# Patient Record
Sex: Female | Born: 1970
Health system: Southern US, Community
[De-identification: ages and names within clinical notes are randomized; demographics above are authoritative.]

## PROBLEM LIST (undated history)

## (undated) DIAGNOSIS — Z3046 Encounter for surveillance of implantable subdermal contraceptive: Secondary | ICD-10-CM

## (undated) DIAGNOSIS — B182 Chronic viral hepatitis C: Secondary | ICD-10-CM

## (undated) DIAGNOSIS — Z8669 Personal history of other diseases of the nervous system and sense organs: Secondary | ICD-10-CM

## (undated) DIAGNOSIS — Z309 Encounter for contraceptive management, unspecified: Secondary | ICD-10-CM

## (undated) DIAGNOSIS — K219 Gastro-esophageal reflux disease without esophagitis: Secondary | ICD-10-CM

## (undated) HISTORY — DX: Gastro-esophageal reflux disease without esophagitis: K21.9

## (undated) HISTORY — PX: KNEE ARTHROPLASTY: SHX992

## (undated) HISTORY — DX: Encounter for surveillance of implantable subdermal contraceptive: Z30.46

## (undated) HISTORY — PX: CHOLECYSTECTOMY: SHX55

## (undated) HISTORY — DX: Encounter for contraceptive management, unspecified: Z30.9

## (undated) HISTORY — PX: BILE DUCT EXPLORATION: SHX1225

---

## 2011-04-20 ENCOUNTER — Encounter: Payer: Self-pay | Admitting: Emergency Medicine

## 2011-04-20 ENCOUNTER — Emergency Department (HOSPITAL_COMMUNITY)
Admission: EM | Admit: 2011-04-20 | Discharge: 2011-04-20 | Disposition: A | Discharge status: Home / Self Care | Payer: No Typology Code available for payment source | Attending: Emergency Medicine | Admitting: Emergency Medicine

## 2011-04-20 DIAGNOSIS — M549 Dorsalgia, unspecified: Secondary | ICD-10-CM | POA: Insufficient documentation

## 2011-04-20 DIAGNOSIS — S139XXA Sprain of joints and ligaments of unspecified parts of neck, initial encounter: Secondary | ICD-10-CM | POA: Insufficient documentation

## 2011-04-20 DIAGNOSIS — S161XXA Strain of muscle, fascia and tendon at neck level, initial encounter: Secondary | ICD-10-CM

## 2011-04-20 DIAGNOSIS — Y92410 Unspecified street and highway as the place of occurrence of the external cause: Secondary | ICD-10-CM | POA: Insufficient documentation

## 2011-04-20 MED ORDER — KETOROLAC TROMETHAMINE 60 MG/2ML IM SOLN
60.0000 mg | Freq: Once | INTRAMUSCULAR | Status: AC
  Administered 2011-04-20: 60 mg via INTRAMUSCULAR
  Filled 2011-04-20: qty 2

## 2011-04-20 MED ORDER — CYCLOBENZAPRINE HCL 10 MG PO TABS: 10.0000 mg | ORAL_TABLET | Freq: Two times a day (BID) | ORAL | Status: AC | PRN

## 2011-04-20 MED ORDER — IBUPROFEN 800 MG PO TABS: 800.0000 mg | ORAL_TABLET | Freq: Three times a day (TID) | ORAL | Status: AC
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
## 2011-06-06 DIAGNOSIS — B182 Chronic viral hepatitis C: Secondary | ICD-10-CM | POA: Insufficient documentation

## 2011-09-12 ENCOUNTER — Other Ambulatory Visit (HOSPITAL_COMMUNITY)
Admission: RE | Admit: 2011-09-12 | Discharge: 2011-09-12 | Disposition: A | Discharge status: Home / Self Care | Payer: Medicaid Other | Source: Ambulatory Visit | Attending: Obstetrics and Gynecology | Admitting: Obstetrics and Gynecology

## 2011-09-12 DIAGNOSIS — Z01419 Encounter for gynecological examination (general) (routine) without abnormal findings: Secondary | ICD-10-CM | POA: Insufficient documentation

## 2011-12-08 ENCOUNTER — Emergency Department (HOSPITAL_COMMUNITY)
Admission: EM | Admit: 2011-12-08 | Discharge: 2011-12-08 | Disposition: A | Discharge status: Home / Self Care | Payer: Medicaid Other | Attending: Emergency Medicine | Admitting: Emergency Medicine

## 2011-12-08 ENCOUNTER — Encounter (HOSPITAL_COMMUNITY): Payer: Self-pay | Admitting: *Deleted

## 2011-12-08 DIAGNOSIS — T310 Burns involving less than 10% of body surface: Secondary | ICD-10-CM | POA: Insufficient documentation

## 2011-12-08 DIAGNOSIS — B1920 Unspecified viral hepatitis C without hepatic coma: Secondary | ICD-10-CM | POA: Insufficient documentation

## 2011-12-08 DIAGNOSIS — T23209A Burn of second degree of unspecified hand, unspecified site, initial encounter: Secondary | ICD-10-CM | POA: Insufficient documentation

## 2011-12-08 DIAGNOSIS — X19XXXA Contact with other heat and hot substances, initial encounter: Secondary | ICD-10-CM | POA: Insufficient documentation

## 2011-12-08 DIAGNOSIS — T23201A Burn of second degree of right hand, unspecified site, initial encounter: Secondary | ICD-10-CM

## 2011-12-08 HISTORY — DX: Chronic viral hepatitis C: B18.2

## 2011-12-08 MED ORDER — SILVER SULFADIAZINE 1 % EX CREA: TOPICAL_CREAM | Freq: Once | CUTANEOUS | Status: DC

## 2011-12-08 MED ORDER — HYDROCODONE-ACETAMINOPHEN 5-325 MG PO TABS: ORAL_TABLET | ORAL | Status: DC

## 2011-12-08 MED ORDER — SILVER SULFADIAZINE 1 % EX CREA
TOPICAL_CREAM | Freq: Once | CUTANEOUS | Status: AC
  Administered 2011-12-08: 19:00:00 via TOPICAL
  Filled 2011-12-08: qty 50

## 2011-12-08 MED ORDER — HYDROCODONE-ACETAMINOPHEN 5-325 MG PO TABS
1.0000 | ORAL_TABLET | Freq: Once | ORAL | Status: AC
  Administered 2011-12-08: 1 via ORAL
  Filled 2011-12-08: qty 1
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
## 2011-12-08 NOTE — Discharge Instructions (Signed)
Burn Care Your skin is a natural barrier to infection. It is the largest organ of your body. Burns damage this natural protection. To help prevent infection, it is very important to follow your caregiver's instructions in the care of your burn. Burns are classified as:  First degree. There is only redness of the skin (erythema). No scarring is expected.   Second degree. There is blistering of the skin. Scarring may occur with deeper burns.   Third degree. All layers of the skin are injured, and scarring is expected.  HOME CARE INSTRUCTIONS   Wash your hands well before changing your bandage.   Change your bandage as often as directed by your caregiver.   Remove the old bandage. If the bandage sticks, you may soak it off with cool, clean water.   Cleanse the burn thoroughly but gently with mild soap and water.   Pat the area dry with a clean, dry cloth.   Apply a thin layer of antibacterial cream to the burn.   Apply a clean bandage as instructed by your caregiver.   Keep the bandage as clean and dry as possible.   Elevate the affected area for the first 24 hours, then as instructed by your caregiver.   Only take over-the-counter or prescription medicines for pain, discomfort, or fever as directed by your caregiver.  SEEK IMMEDIATE MEDICAL CARE IF:   You develop excessive pain.   You develop redness, tenderness, swelling, or red streaks near the burn.   The burned area develops yellowish-white fluid (pus) or a bad smell.   You have a fever.  MAKE SURE YOU:   Understand these instructions.   Will watch your condition.   Will get help right away if you are not doing well or get worse.  Document Released: 09/01/2005 Document Revised: 08/21/2011 Document Reviewed: 01/22/2011 [facility] Patient Information 2012 [place], [place].   Take the pain medicine as directed.  Wash burn twice daily with soap and water then apply silvadene dressing.  Return as needed.

## 2011-12-12 NOTE — ED Provider Notes (Signed)
  Medical screening examination/treatment/procedure(s) were performed by non-physician practitioner and as supervising physician I was immediately available for consultation/collaboration.    Shelda Jakes, MD 12/12/11 [phone]

## 2011-12-22 ENCOUNTER — Emergency Department (HOSPITAL_COMMUNITY)
Admission: EM | Admit: 2011-12-22 | Discharge: 2011-12-22 | Disposition: A | Discharge status: Home / Self Care | Payer: Medicaid Other | Attending: Emergency Medicine | Admitting: Emergency Medicine

## 2011-12-22 ENCOUNTER — Encounter (HOSPITAL_COMMUNITY): Payer: Self-pay | Admitting: *Deleted

## 2011-12-22 DIAGNOSIS — B182 Chronic viral hepatitis C: Secondary | ICD-10-CM | POA: Insufficient documentation

## 2011-12-22 DIAGNOSIS — R51 Headache: Secondary | ICD-10-CM

## 2011-12-22 DIAGNOSIS — G43909 Migraine, unspecified, not intractable, without status migrainosus: Secondary | ICD-10-CM | POA: Insufficient documentation

## 2011-12-22 DIAGNOSIS — R21 Rash and other nonspecific skin eruption: Secondary | ICD-10-CM | POA: Insufficient documentation

## 2011-12-22 HISTORY — DX: Personal history of other diseases of the nervous system and sense organs: Z86.69

## 2011-12-22 MED ORDER — METOCLOPRAMIDE HCL 5 MG/ML IJ SOLN
10.0000 mg | Freq: Once | INTRAMUSCULAR | Status: AC
  Administered 2011-12-22: 10 mg via INTRAVENOUS
  Filled 2011-12-22: qty 2

## 2011-12-22 MED ORDER — DEXAMETHASONE SODIUM PHOSPHATE 4 MG/ML IJ SOLN
10.0000 mg | Freq: Once | INTRAMUSCULAR | Status: AC
  Administered 2011-12-22: 10 mg via INTRAVENOUS
  Filled 2011-12-22: qty 3

## 2011-12-22 MED ORDER — SODIUM CHLORIDE 0.9 % IV BOLUS (SEPSIS)
1000.0000 mL | Freq: Once | INTRAVENOUS | Status: AC
  Administered 2011-12-22: 1000 mL via INTRAVENOUS

## 2011-12-22 MED ORDER — DIPHENHYDRAMINE HCL 50 MG/ML IJ SOLN
25.0000 mg | Freq: Once | INTRAMUSCULAR | Status: AC
  Administered 2011-12-22: 25 mg via INTRAVENOUS
  Filled 2011-12-22: qty 1

## 2011-12-22 NOTE — Discharge Instructions (Signed)
Headache  Headaches are caused by many different problems. Most commonly, headache is caused by muscle tension from an injury, fatigue, or emotional upset. Excessive muscle contractions in the scalp and neck result in a headache that often feels like a tight band around the head. Tension headaches often have areas of tenderness over the scalp and the back of the neck. These headaches may last for hours, days, or longer, and some may contribute to migraines in those who have migraine problems.  Migraines usually cause a throbbing headache, which is made worse by activity. Sometimes only one side of the head hurts. Nausea, vomiting, eye pain, and avoidance of food are common with migraines. Visual symptoms such as light sensitivity, blind spots, or flashing lights may also occur. Loud noises may worsen migraine headaches. Many factors may cause migraine headaches:  Emotional stress, lack of sleep, and menstrual periods.  Alcohol and some drugs (such as birth control pills).  Diet factors (fasting, caffeine, food preservatives, chocolate).  Environmental factors (weather changes, bright lights, odors, smoke).  Other causes of headaches include minor injuries to the head. Arthritis in the neck; problems with the jaw, eyes, ears, or nose are also causes of headaches. Allergies, drugs, alcohol, and exposure to smoke can also cause moderate headaches. Rebound headaches can occur if someone uses pain medications for a long period of time and then stops. Less commonly, blood vessel problems in the neck and brain (including stroke) can cause various types of headache.  Treatment of headaches includes medicines for pain and relaxation. Ice packs or heat applied to the back of the head and neck help some people. Massaging the shoulders, neck and scalp are often very useful. Relaxation techniques and stretching can help prevent these headaches. Avoid alcohol and cigarette smoking as these tend to make headaches worse.  Please see your caregiver if your headache is not better in 2 days.  SEEK IMMEDIATE MEDICAL CARE IF:  You develop a high fever, chills, or repeated vomiting.  You faint or have difficulty with vision.  You develop unusual numbness or weakness of your arms or legs.  Relief of pain is inadequate with medication, or you develop severe pain.  You develop confusion, or neck stiffness.  You have a worsening of a headache or do not obtain relief.

## 2011-12-22 NOTE — ED Provider Notes (Signed)
History     CSN: [id number]  Arrival date & time 12/22/11  0138   First MD Initiated Contact with Patient 12/22/11 [phone]      Chief Complaint  Patient presents with  . Migraine    (Consider location/radiation/quality/duration/timing/severity/associated sxs/prior treatment) Patient is a 41 y.o. female presenting with migraine. The history is provided by the patient and the spouse.  Migraine This is a new problem. The current episode started 3 to 5 hours ago. The problem occurs constantly. The problem has been gradually worsening. Associated symptoms include headaches. Pertinent negatives include no chest pain, no abdominal pain and no shortness of breath.  Location right sided. No radiation. Quality throbbing in nature. Duration constant since onset. Severe pain.  Gradual onset. Patient has a history of migraine headaches but has not had one this severe in some time. She took Imitrex with onset of symptoms which did not help.  No associated neck pain or stiffness. No fevers or chills. No new rash or recent illness.  Patient has persistent chronic rash from her hepatitis C medications.  No weakness or numbness. No change in vision. No difficulty walking or speaking.  Past Medical History  Diagnosis Date  . Hep C w/o coma, chronic   . Hx of migraines     Past Surgical History  Procedure Date  . Cholecystectomy   . Cesarean section     History reviewed. No pertinent family history.  History  Substance Use Topics  . Smoking status: Never Smoker   . Smokeless tobacco: Not on file  . Alcohol Use: No    OB History    Grav Para Term Preterm Abortions TAB SAB Ect Mult Living   3 3 3       3       Review of Systems  Constitutional: Negative for fever and chills.  HENT: Negative for neck pain and neck stiffness.   Eyes: Negative for pain.  Respiratory: Negative for shortness of breath.   Cardiovascular: Negative for chest pain.  Gastrointestinal: Negative for abdominal pain.    Genitourinary: Negative for dysuria.  Musculoskeletal: Negative for back pain.  Skin: Negative for rash.  Neurological: Positive for headaches. Negative for seizures and syncope.  All other systems reviewed and are negative.    Allergies  Review of patient's allergies indicates no known allergies.  Home Medications   Current Outpatient Rx  Name Route Sig Dispense Refill  . RIBAVIRIN 200 MG PO CAPS Oral Take 200 mg by mouth 2 (two) times daily.    . SUMATRIPTAN SUCCINATE 50 MG PO TABS Oral Take 50 mg by mouth every 2 (two) hours as needed.    Marland Kitchen HYDROCODONE-ACETAMINOPHEN 5-325 MG PO TABS  One tab po q 4-6 hrs prn pain 12 tablet 0    BP 124/82  Pulse 79  Temp(Src) 98 F (36.7 C) (Oral)  Resp 18  Ht 5\' 4"  (1.626 m)  Wt 160 lb (72.576 kg)  BMI 27.46 kg/m2  SpO2 99%  LMP 11/21/2011  Physical Exam  Constitutional: She is oriented to person, place, and time. She appears well-developed and well-nourished.  HENT:  Head: Normocephalic and atraumatic.  Eyes: Conjunctivae and EOM are normal. Pupils are equal, round, and reactive to light.  Neck: Full passive range of motion without pain. Neck supple. No thyromegaly present.       No meningismus  Cardiovascular: Normal rate, regular rhythm, S1 normal, S2 normal and intact distal pulses.   Pulmonary/Chest: Effort normal and breath sounds normal.  Abdominal: Soft. Bowel sounds are normal. There is no tenderness. There is no CVA tenderness.  Musculoskeletal: Normal range of motion.  Neurological: She is alert and oriented to person, place, and time. She has normal strength and normal reflexes. No cranial nerve deficit or sensory deficit. She displays a negative Romberg sign. GCS eye subscore is 4. GCS verbal subscore is 5. GCS motor subscore is 6.       Normal Gait  Skin: Skin is warm and dry. No rash noted. No cyanosis. Nails show no clubbing.  Psychiatric: She has a normal mood and affect. Her speech is normal and behavior is normal.     ED Course  Procedures (including critical care time)  IV fluids and HA cocktail provided.  Recheck at 3:20 AM is feeling much better and requesting to be discharged home. HA resolved without change on normal neuro exam   MDM   Headache with history of migraines and presentation concerning for the same. Symptoms improved with medications as above. No clinical meningitis or findings to suggest acute infectious process.  Presentation does not suggest subarachnoid hemorrhage. Plan discharge home withprimary care followup as needed. Reliable historian states understanding all discharge and followup instructions, in addition to, strict return precautions.         Sunnie Nielsen, MD 12/22/11 [phone]

## 2011-12-22 NOTE — ED Notes (Signed)
Patient asleep at this time.

## 2012-01-25 ENCOUNTER — Emergency Department (HOSPITAL_COMMUNITY)
Admission: EM | Admit: 2012-01-25 | Discharge: 2012-01-25 | Disposition: A | Discharge status: Home / Self Care | Payer: Medicaid Other | Attending: Emergency Medicine | Admitting: Emergency Medicine

## 2012-01-25 ENCOUNTER — Emergency Department (HOSPITAL_COMMUNITY): Payer: Medicaid Other

## 2012-01-25 ENCOUNTER — Encounter (HOSPITAL_COMMUNITY): Payer: Self-pay | Admitting: *Deleted

## 2012-01-25 DIAGNOSIS — Z79899 Other long term (current) drug therapy: Secondary | ICD-10-CM | POA: Insufficient documentation

## 2012-01-25 DIAGNOSIS — R059 Cough, unspecified: Secondary | ICD-10-CM | POA: Insufficient documentation

## 2012-01-25 DIAGNOSIS — J9801 Acute bronchospasm: Secondary | ICD-10-CM | POA: Insufficient documentation

## 2012-01-25 DIAGNOSIS — B182 Chronic viral hepatitis C: Secondary | ICD-10-CM | POA: Insufficient documentation

## 2012-01-25 DIAGNOSIS — R05 Cough: Secondary | ICD-10-CM | POA: Insufficient documentation

## 2012-01-25 MED ORDER — ALBUTEROL SULFATE (5 MG/ML) 0.5% IN NEBU
5.0000 mg | INHALATION_SOLUTION | Freq: Once | RESPIRATORY_TRACT | Status: AC
  Administered 2012-01-25: 5 mg via RESPIRATORY_TRACT
  Filled 2012-01-25: qty 1

## 2012-01-25 MED ORDER — PREDNISONE 20 MG PO TABS
60.0000 mg | ORAL_TABLET | Freq: Once | ORAL | Status: AC
  Administered 2012-01-25: 60 mg via ORAL
  Filled 2012-01-25: qty 3

## 2012-01-25 MED ORDER — ALBUTEROL SULFATE HFA 108 (90 BASE) MCG/ACT IN AERS
2.0000 | INHALATION_SPRAY | RESPIRATORY_TRACT | Status: DC | PRN
  Administered 2012-01-25: 2 via RESPIRATORY_TRACT
  Filled 2012-01-25: qty 6.7

## 2012-01-25 MED ORDER — PREDNISONE 20 MG PO TABS: 60.0000 mg | ORAL_TABLET | Freq: Every day | ORAL | Status: AC

## 2012-01-25 NOTE — Discharge Instructions (Signed)
Bronchospasm, Adult Bronchospasm means that there is a spasm or tightening of the airways going into the lungs. Because the airways go into a spasm and get smaller it makes breathing more difficult. For reasons not completely known, workings (functions) of the airways designed to protect the lungs become over active. This causes the airways to become more sensitive to:  Infection.   Weather.   Exercise.   Irritants.   Things that cause allergic reactions or allergies (allergens).  Frequent coughing or respiratory episodes should be checked for the cause. This condition may be made worse by exercise. CAUSES  Inflammation is often the cause of this condition. Allergy, viral respiratory infections, or irritants in the air often cause this problem. Allergic reactions produce immediate and delayed responses. Late reactions may produce more serious inflammation. This may lead to increased reactivity of the airways. Sometimes this is inherited. Some common triggers are:  Allergies.   Infection commonly triggers attacks. Antibiotics are not helpful for viral infections and usually do not help with attacks of bronchospasm.   Exercise (running, etc.) can trigger an attack. Proper pre-exercise medications help most individuals participate in sports. Swimming is the least likely sport to cause problems.   Irritants (for example, pollution, cigarette smoke, strong odors, aerosol sprays, paint fumes, etc.) may trigger attacks. You cannot smoke and do not allow smoking in your home. This is absolutely necessary. Show this instruction to mates, relatives and significant others that may not agree with you.   Weather changes may cause lung problems but moving around trying to find an ideal climate does not seem to be overly helpful. Winds increase molds and pollens in the air. Rain refreshes the air by washing irritants out. Cold air may cause irritation.   Emotional problems do not cause lung problems but  can trigger attacks.  SYMPTOMS  Wheezing is the most common symptom. Frequent coughing (with or without exercise and or crying) and repeated respiratory infections are all early warning signs of bronchospasm. Chest tightness and shortness of breath are other symptoms. DIAGNOSIS  Early hidden bronchospasm may go for long periods of time without being detected. This is especially true if wheezing cannot be detected by your caregiver. Lung (pulmonary) function studies may help with diagnosis in these cases. HOME CARE INSTRUCTIONS   It is necessary to remain calm during an attack. Try to relax and breathe more slowly. During this time medications may be given. If any breathing problems seem to be getting worse and are unresponsive to treatment seek immediate medical care.   If you have severe breathing difficulty or have had a life threatening attack it is probably a good idea for you to learn how to give adrenaline (epi-pen) or use an anaphylaxis kit. Your caregiver can help you with this. These are the same kits carried by people who have severe allergic reactions. This is especially important if you do not have readily accessible medical care.   With any severe breathing problems where epinephrine (adrenaline) has been given at home call 911 immediately as the delayed reaction may be even more severe.  SEEK MEDICAL CARE IF:   There is wheezing and shortness of breath, even if medications are given to prevent attacks.   An oral temperature above 102 F (38.9 C) develops.   There are muscle aches, chest pain, or thickening of sputum.   The sputum changes from clear or white to yellow, green, gray, or bloody.   There are problems that may be related to   the medicine you are given, such as a rash, itching, swelling, or trouble breathing.  SEEK IMMEDIATE MEDICAL CARE IF:   The usual medicines do not stop your wheezing, or there is increased coughing.   You have increased difficulty breathing.    MAKE SURE YOU:   Understand these instructions.   Will watch your condition.

## 2012-01-25 NOTE — ED Provider Notes (Signed)
History     CSN: [id number]  Arrival date & time 01/25/12  0543   First MD Initiated Contact with Patient 01/25/12 [phone]      Chief Complaint  Patient presents with  . Wheezing  . Shortness of Breath    (Consider location/radiation/quality/duration/timing/severity/associated sxs/prior treatment) HPI History provided by patient. Wheezing and shortness of breath started today, worse with exertion. No history of asthma but has required an albuterol inhaler in the past. No fevers or chills. Some cough without productive sputum. No known sick contacts. Patient does suffer from allergies and attributes this to her symptoms.  No rash. No recent travel. No new medications. Moderate in severity. No known alleviating factors. She takes Claritin and Flonase for her seasonal allergies that seem to be worse over the last few days. Past Medical History  Diagnosis Date  . Hep C w/o coma, chronic   . Hx of migraines     Past Surgical History  Procedure Date  . Cholecystectomy   . Cesarean section     No family history on file.  History  Substance Use Topics  . Smoking status: Never Smoker   . Smokeless tobacco: Not on file  . Alcohol Use: No    OB History    Grav Para Term Preterm Abortions TAB SAB Ect Mult Living   3 3 3       3       Review of Systems  Constitutional: Negative for fever and chills.  HENT: Negative for neck pain and neck stiffness.   Eyes: Negative for pain.  Respiratory: Positive for cough and shortness of breath.   Cardiovascular: Negative for chest pain.  Gastrointestinal: Negative for abdominal pain.  Genitourinary: Negative for dysuria.  Musculoskeletal: Negative for back pain.  Skin: Negative for rash.  Neurological: Negative for headaches.  All other systems reviewed and are negative.    Allergies  Review of patient's allergies indicates no known allergies.  Home Medications   Current Outpatient Rx  Name Route Sig Dispense Refill  .  FLUTICASONE PROPIONATE 50 MCG/ACT NA SUSP Nasal Place 2 sprays into the nose daily.    Marland Kitchen PEGINTERFERON ALFA-2A 180 MCG/ML [place] SOLN Subcutaneous Inject 180 mcg into the skin every 7 (seven) days.    Marland Kitchen HYDROCODONE-ACETAMINOPHEN 5-325 MG PO TABS  One tab po q 4-6 hrs prn pain 12 tablet 0  . RIBAVIRIN 200 MG PO CAPS Oral Take 200 mg by mouth 2 (two) times daily.    . SUMATRIPTAN SUCCINATE 50 MG PO TABS Oral Take 50 mg by mouth every 2 (two) hours as needed.      BP 131/93  Pulse 86  Temp(Src) 98.2 F (36.8 C) (Oral)  Resp 22  Ht 5\' 6"  (1.676 m)  Wt 158 lb (71.668 kg)  BMI 25.50 kg/m2  SpO2 99%  LMP 01/14/2012  Physical Exam  Constitutional: She is oriented to person, place, and time. She appears well-developed and well-nourished.  HENT:  Head: Normocephalic and atraumatic.  Eyes: Conjunctivae and EOM are normal. Pupils are equal, round, and reactive to light.  Neck: Trachea normal. Neck supple. No thyromegaly present.  Cardiovascular: Normal rate, regular rhythm, S1 normal, S2 normal and normal pulses.     No systolic murmur is present   No diastolic murmur is present  Pulses:      Radial pulses are 2+ on the right side, and 2+ on the left side.  Pulmonary/Chest: She has no rhonchi. She exhibits no tenderness.  Bilateral expiratory wheezes with mild tachypnea but no respiratory distress.  Abdominal: Soft. Normal appearance and bowel sounds are normal. There is no tenderness. There is no CVA tenderness and negative Murphy's sign.  Musculoskeletal:       BLE:s Calves nontender, no cords or erythema, negative Homans sign  Neurological: She is alert and oriented to person, place, and time. She has normal strength. No cranial nerve deficit or sensory deficit. GCS eye subscore is 4. GCS verbal subscore is 5. GCS motor subscore is 6.  Skin: Skin is warm and dry. No rash noted. She is not diaphoretic.  Psychiatric: Her speech is normal.       Cooperative and appropriate    ED Course    Procedures (including critical care time)  Labs Reviewed - No data to display Dg Chest Portable 1 View  01/25/2012  *RADIOLOGY REPORT*  Clinical Data: Shortness of breath and wheezing  PORTABLE CHEST - 1 VIEW  Comparison: None.  Findings: Normal heart size and pulmonary vascularity.  No focal airspace consolidation in the lungs.  No blunting of costophrenic angles.  No pneumothorax.  Lucencies in the upper lungs may represent emphysema.  Surgical clips in the right upper quadrant.  IMPRESSION: No evidence of active pulmonary disease.  Original Report Authenticated By: Marlon Pel, M.D.   Albuterol and prednisone provided  Recheck after albuterol lung sounds much improved objectively and lung sounds clear bilaterally. Chest x-ray obtained and reviewed as above. Patient observed in the ED with no change in improved condition  MDM   Bronchospasm improved with steroids and albuterol. Inhaler provided with prescription for five-day course of prednisone. Patient has primary care physician and agrees to followup in the clinic for any persistent symptoms. Stable for discharge home.       Sunnie Nielsen, MD 01/25/12 [phone]

## 2012-01-25 NOTE — ED Notes (Signed)
Pt reports she has seasonal allergies but today she is wheezing and having increased sob especially with exertion

## 2012-06-16 DIAGNOSIS — I8310 Varicose veins of unspecified lower extremity with inflammation: Secondary | ICD-10-CM | POA: Insufficient documentation

## 2012-08-19 ENCOUNTER — Other Ambulatory Visit: Payer: Self-pay | Admitting: Neurology

## 2012-08-19 DIAGNOSIS — R42 Dizziness and giddiness: Secondary | ICD-10-CM

## 2012-08-19 DIAGNOSIS — R51 Headache: Secondary | ICD-10-CM

## 2012-08-27 ENCOUNTER — Ambulatory Visit (HOSPITAL_COMMUNITY)
Admission: RE | Admit: 2012-08-27 | Discharge: 2012-08-27 | Disposition: A | Discharge status: Home / Self Care | Payer: Medicaid Other | Source: Ambulatory Visit | Attending: Neurology | Admitting: Neurology

## 2012-08-27 DIAGNOSIS — R51 Headache: Secondary | ICD-10-CM

## 2012-08-27 DIAGNOSIS — R42 Dizziness and giddiness: Secondary | ICD-10-CM | POA: Insufficient documentation

## 2012-08-27 DIAGNOSIS — J329 Chronic sinusitis, unspecified: Secondary | ICD-10-CM | POA: Insufficient documentation

## 2012-09-20 ENCOUNTER — Other Ambulatory Visit (HOSPITAL_COMMUNITY)
Admission: RE | Admit: 2012-09-20 | Discharge: 2012-09-20 | Disposition: A | Discharge status: Home / Self Care | Payer: Medicaid Other | Source: Ambulatory Visit | Attending: Obstetrics and Gynecology | Admitting: Obstetrics and Gynecology

## 2012-09-20 ENCOUNTER — Other Ambulatory Visit: Payer: Self-pay | Admitting: Adult Health

## 2012-09-20 DIAGNOSIS — Z1151 Encounter for screening for human papillomavirus (HPV): Secondary | ICD-10-CM | POA: Insufficient documentation

## 2012-09-20 DIAGNOSIS — Z01419 Encounter for gynecological examination (general) (routine) without abnormal findings: Secondary | ICD-10-CM | POA: Insufficient documentation

## 2012-09-20 DIAGNOSIS — Z113 Encounter for screening for infections with a predominantly sexual mode of transmission: Secondary | ICD-10-CM | POA: Insufficient documentation

## 2012-12-27 DIAGNOSIS — R42 Dizziness and giddiness: Secondary | ICD-10-CM | POA: Insufficient documentation

## 2012-12-27 DIAGNOSIS — M26629 Arthralgia of temporomandibular joint, unspecified side: Secondary | ICD-10-CM | POA: Insufficient documentation

## 2013-01-08 ENCOUNTER — Emergency Department (HOSPITAL_COMMUNITY): Payer: Medicaid Other

## 2013-01-08 ENCOUNTER — Emergency Department (HOSPITAL_COMMUNITY)
Admission: EM | Admit: 2013-01-08 | Discharge: 2013-01-09 | Disposition: A | Discharge status: Home / Self Care | Payer: Medicaid Other | Attending: Emergency Medicine | Admitting: Emergency Medicine

## 2013-01-08 ENCOUNTER — Encounter (HOSPITAL_COMMUNITY): Payer: Self-pay | Admitting: Emergency Medicine

## 2013-01-08 DIAGNOSIS — S93409A Sprain of unspecified ligament of unspecified ankle, initial encounter: Secondary | ICD-10-CM | POA: Insufficient documentation

## 2013-01-08 DIAGNOSIS — Y9301 Activity, walking, marching and hiking: Secondary | ICD-10-CM | POA: Insufficient documentation

## 2013-01-08 DIAGNOSIS — IMO0002 Reserved for concepts with insufficient information to code with codable children: Secondary | ICD-10-CM | POA: Insufficient documentation

## 2013-01-08 DIAGNOSIS — X500XXA Overexertion from strenuous movement or load, initial encounter: Secondary | ICD-10-CM | POA: Insufficient documentation

## 2013-01-08 DIAGNOSIS — Z8619 Personal history of other infectious and parasitic diseases: Secondary | ICD-10-CM | POA: Insufficient documentation

## 2013-01-08 DIAGNOSIS — Z8679 Personal history of other diseases of the circulatory system: Secondary | ICD-10-CM | POA: Insufficient documentation

## 2013-01-08 DIAGNOSIS — Z79899 Other long term (current) drug therapy: Secondary | ICD-10-CM | POA: Insufficient documentation

## 2013-01-08 DIAGNOSIS — Y92009 Unspecified place in unspecified non-institutional (private) residence as the place of occurrence of the external cause: Secondary | ICD-10-CM | POA: Insufficient documentation

## 2013-01-08 NOTE — ED Notes (Signed)
Patient states she was walking in her house and twisted her left foot and c/o increased swelling.

## 2013-01-09 MED ORDER — IBUPROFEN 600 MG PO TABS
600.0000 mg | ORAL_TABLET | Freq: Four times a day (QID) | ORAL | Status: DC | PRN
Start: 2013-01-09 — End: 2014-11-23

## 2013-01-09 NOTE — ED Provider Notes (Signed)
Medical screening examination/treatment/procedure(s) were performed by non-physician practitioner and as supervising physician I was immediately available for consultation/collaboration.  Nicoletta Dress. Colon Branch, MD 01/09/13 [phone]

## 2013-01-09 NOTE — ED Provider Notes (Signed)
History     CSN: [id number]  Arrival date & time 01/08/13  2318   First MD Initiated Contact with Patient 01/08/13 2337      Chief Complaint  Patient presents with  . Foot Injury    (Consider location/radiation/quality/duration/timing/severity/associated sxs/prior treatment) Patient is a 42 y.o. female presenting with foot injury. The history is provided by the patient.  Foot Injury Location:  Ankle Time since incident:  4 hours Injury: yes   Mechanism of injury comment:  Twisting, inversion injury while walking in her home. Ankle location:  L ankle Pain details:    Quality:  Aching   Radiates to:  Does not radiate   Severity:  Moderate   Onset quality:  Sudden   Timing:  Constant   Progression:  Unchanged Chronicity:  New Dislocation: no   Relieved by:  Rest and NSAIDs Worsened by:  Bearing weight Ineffective treatments:  None tried Associated symptoms: swelling   Associated symptoms: no numbness and no tingling     Past Medical History  Diagnosis Date  . Hep C w/o coma, chronic   . Hx of migraines     Past Surgical History  Procedure Laterality Date  . Cholecystectomy    . Cesarean section      No family history on file.  History  Substance Use Topics  . Smoking status: Never Smoker   . Smokeless tobacco: Not on file  . Alcohol Use: No    OB History   Grav Para Term Preterm Abortions TAB SAB Ect Mult Living   3 3 3       3       Review of Systems  Musculoskeletal: Positive for joint swelling and arthralgias.  Skin: Negative for wound.  Neurological: Negative for weakness and numbness.    Allergies  Review of patient's allergies indicates no known allergies.  Home Medications   Current Outpatient Rx  Name  Route  Sig  Dispense  Refill  . SUMAtriptan (IMITREX) 50 MG tablet   Oral   Take 50 mg by mouth every 2 (two) hours as needed.         . fluticasone (FLONASE) 50 MCG/ACT nasal spray   Nasal   Place 2 sprays into the nose daily.         Marland Kitchen HYDROcodone-acetaminophen (NORCO) 5-325 MG per tablet      One tab po q 4-6 hrs prn pain   12 tablet   0   . ibuprofen (ADVIL,MOTRIN) 600 MG tablet   Oral   Take 1 tablet (600 mg total) by mouth every 6 (six) hours as needed for pain.   30 tablet   0   . peginterferon alfa-2a (PEGASYS) 180 MCG/ML injection   Subcutaneous   Inject 180 mcg into the skin every 7 (seven) days.         . ribavirin (REBETOL) 200 MG capsule   Oral   Take 200 mg by mouth 2 (two) times daily.           BP 144/87  Pulse 78  Temp(Src) 98.2 F (36.8 C) (Oral)  Resp 18  Ht 5\' 4"  (1.626 m)  Wt 168 lb (76.204 kg)  BMI 28.82 kg/m2  SpO2 97%  LMP 12/30/2012  Physical Exam  Nursing note and vitals reviewed. Constitutional: She appears well-developed and well-nourished.  HENT:  Head: Normocephalic.  Cardiovascular: Normal rate and intact distal pulses.  Exam reveals no decreased pulses.   Pulses:      Dorsalis pedis  pulses are 2+ on the right side, and 2+ on the left side.       Posterior tibial pulses are 2+ on the right side, and 2+ on the left side.  Musculoskeletal: She exhibits edema and tenderness.       Left ankle: She exhibits decreased range of motion and swelling. She exhibits no deformity and normal pulse. Tenderness. Lateral malleolus tenderness found. No head of 5th metatarsal and no proximal fibula tenderness found. Achilles tendon normal.  Neurological: She is alert. No sensory deficit.  Skin: Skin is warm, dry and intact.    ED Course  Procedures (including critical care time)  Labs Reviewed - No data to display Dg Ankle Complete Left  01/09/2013  *RADIOLOGY REPORT*  Clinical Data: Twisting injury to the left ankle and foot with pain and swelling.  LEFT ANKLE COMPLETE - 3+ VIEW  Comparison: None.  Findings: The left ankle appears intact. No evidence of acute fracture or subluxation.  No focal bone lesions.  Bone matrix and cortex appear intact.  No abnormal radiopaque  densities in the soft tissues.  Ankle mortis and talar dome appear intact.  IMPRESSION: No acute bony abnormalities demonstrated in the left ankle.   Original Report Authenticated By: Burman Nieves, M.D.    Dg Foot Complete Left  01/09/2013  *RADIOLOGY REPORT*  Clinical Data: Twisting injury to the left ankle and foot with pain and swelling.  LEFT FOOT - COMPLETE 3+ VIEW  Comparison: None.  Findings: Left foot appears intact. No evidence of acute fracture or subluxation.  No focal bone lesions.  Bone matrix and cortex appear intact.  No abnormal radiopaque densities in the soft tissues.  IMPRESSION: No acute bony abnormalities involving the left foot.   Original Report Authenticated By: Burman Nieves, M.D.      1. Ankle sprain and strain, left, initial encounter       MDM  Patients labs and/or radiological studies were viewed and considered during the medical decision making and disposition process.  ASO and crutches provided.  Cap refill normal after ASO applied.  RICE, referral to pcp if pain symptoms and swelling are not better over the next 10 days.            Burgess Amor, PA-C 01/09/13 [phone]

## 2013-04-07 ENCOUNTER — Ambulatory Visit (HOSPITAL_COMMUNITY)
Admission: RE | Admit: 2013-04-07 | Discharge: 2013-04-07 | Disposition: A | Discharge status: Home / Self Care | Payer: Medicaid Other | Source: Ambulatory Visit | Attending: Internal Medicine | Admitting: Internal Medicine

## 2013-04-07 ENCOUNTER — Other Ambulatory Visit (HOSPITAL_COMMUNITY): Payer: Self-pay | Admitting: Internal Medicine

## 2013-04-07 DIAGNOSIS — M79609 Pain in unspecified limb: Secondary | ICD-10-CM | POA: Insufficient documentation

## 2013-04-07 DIAGNOSIS — M542 Cervicalgia: Secondary | ICD-10-CM | POA: Insufficient documentation

## 2013-06-06 ENCOUNTER — Ambulatory Visit (INDEPENDENT_AMBULATORY_CARE_PROVIDER_SITE_OTHER): Payer: Self-pay | Admitting: Obstetrics and Gynecology

## 2013-06-06 ENCOUNTER — Encounter: Payer: Self-pay | Admitting: Obstetrics and Gynecology

## 2013-06-06 VITALS — BP 120/78 | Ht 66.0 in | Wt 170.4 lb

## 2013-06-06 DIAGNOSIS — N898 Other specified noninflammatory disorders of vagina: Secondary | ICD-10-CM

## 2013-06-06 DIAGNOSIS — N920 Excessive and frequent menstruation with regular cycle: Secondary | ICD-10-CM

## 2013-06-06 DIAGNOSIS — N939 Abnormal uterine and vaginal bleeding, unspecified: Secondary | ICD-10-CM

## 2013-06-06 LAB — POCT HEMOGLOBIN: Hemoglobin: 11.4 g/dL — AB (ref 12.2–16.2)

## 2013-06-06 NOTE — Progress Notes (Signed)
   [facility] Clinic Visit  Patient name: Amber Russell MRN [id number]  Date of birth: 1971/03/19  CC & HPI:  Amber Russell is a 42 y.o. female presenting today for heavy menses x 3 wk.  Using Mirena since may 4 of 2014.   Prior history of mirena use, and   ROS:  rlq pain erratic, has had u/s this yr, "ovaries stuck" Will need u/s  Pertinent History Reviewed:  Medical & Surgical Hx:  Reviewed: Significant for  Medications: Reviewed & Updated - see associated section Social History: Reviewed -  reports that she has never smoked. She does not have any smokeless tobacco history on file.  Objective Findings:  Vitals: BP 120/78  Ht 5\' 6"  (1.676 m)  Wt 77.293 kg (170 lb 6.4 oz)  BMI 27.52 kg/m2  LMP 05/19/2013  Physical Examination: General appearance - alert, well appearing, and in no distress and oriented to person, place, and time Abdomen - soft, nontender, nondistended, no masses or organomegaly Pelvic - normal external genitalia, vulva, vagina, cervix, uterus and adnexa, iud string present , 1.5 cm length.  Adnexa nontender.   Assessment & Plan:   DUB assocaiated with IUD  Plan :  Transvaginal u/s with photos and 3-D view

## 2013-06-10 ENCOUNTER — Ambulatory Visit (INDEPENDENT_AMBULATORY_CARE_PROVIDER_SITE_OTHER): Payer: Self-pay

## 2013-06-10 ENCOUNTER — Other Ambulatory Visit: Payer: Self-pay | Admitting: Obstetrics and Gynecology

## 2013-06-10 ENCOUNTER — Encounter: Payer: Self-pay | Admitting: Obstetrics and Gynecology

## 2013-06-10 ENCOUNTER — Ambulatory Visit (INDEPENDENT_AMBULATORY_CARE_PROVIDER_SITE_OTHER): Payer: Self-pay | Admitting: Obstetrics and Gynecology

## 2013-06-10 VITALS — BP 120/80 | Ht 66.0 in | Wt 170.0 lb

## 2013-06-10 DIAGNOSIS — N925 Other specified irregular menstruation: Secondary | ICD-10-CM

## 2013-06-10 DIAGNOSIS — N920 Excessive and frequent menstruation with regular cycle: Secondary | ICD-10-CM

## 2013-06-10 DIAGNOSIS — N949 Unspecified condition associated with female genital organs and menstrual cycle: Secondary | ICD-10-CM

## 2013-06-10 DIAGNOSIS — N938 Other specified abnormal uterine and vaginal bleeding: Secondary | ICD-10-CM

## 2013-06-10 MED ORDER — ESTRADIOL 1 MG PO TABS: 1.0000 mg | ORAL_TABLET | Freq: Every day | ORAL | Status: DC

## 2013-06-10 NOTE — Progress Notes (Signed)
Patient ID: Amber Russell, female   DOB: 1971-09-13, 42 y.o.   MRN: [id number] Pt here today for [name] and then see J[facility].

## 2013-06-10 NOTE — Patient Instructions (Signed)
TAKE ESTRACE PILL TWICE DAILY UNTIL BLEEDING CONTROLLED, IF FUTURE PERIOD GOES OVER 5 DAYS, RESUME THE PILLS TWICE DAILY, kEEP A RECORD OF YOUR MENSTRUAL BLEEDING ,PLEASE LET us KNOW IF BLEEDING CONTINUES TO BE  A PROBLEM.

## 2013-06-15 ENCOUNTER — Other Ambulatory Visit: Payer: Self-pay

## 2013-06-15 ENCOUNTER — Ambulatory Visit: Payer: Self-pay | Admitting: Obstetrics and Gynecology

## 2013-09-12 ENCOUNTER — Ambulatory Visit: Payer: Self-pay | Admitting: Obstetrics and Gynecology

## 2013-09-20 ENCOUNTER — Encounter: Payer: Self-pay | Admitting: Obstetrics and Gynecology

## 2013-09-20 ENCOUNTER — Ambulatory Visit (INDEPENDENT_AMBULATORY_CARE_PROVIDER_SITE_OTHER): Payer: BC Managed Care – PPO | Admitting: Obstetrics and Gynecology

## 2013-09-20 VITALS — BP 126/84 | Ht 64.0 in | Wt 171.4 lb

## 2013-09-20 DIAGNOSIS — K219 Gastro-esophageal reflux disease without esophagitis: Secondary | ICD-10-CM

## 2013-09-20 MED ORDER — OMEPRAZOLE 20 MG PO CPDR: 20.0000 mg | DELAYED_RELEASE_CAPSULE | Freq: Every day | ORAL | Status: DC

## 2013-09-20 NOTE — Progress Notes (Signed)
Patient ID: Amber Russell, female   DOB: 1971/09/10, 43 y.o.   MRN: [id number]  Chief Complaint  Patient presents with  . Follow-up    c/o abdominal pain, upper gastric pain    HPI Amber Russell is a 43 y.o. female.   She has 2 c/o: occasional rlq discomfort just above ant sup iliac spine, comes and goes, none at present. Not reproduced on today's exam. The second pain is epigastric, esp at nite, awakens pt, not relieved with trial zantac. Hx cholecystectomy   HPI  Past Medical History  Diagnosis Date  . Hep C w/o coma, chronic   . Hx of migraines     Past Surgical History  Procedure Laterality Date  . Cholecystectomy    . Cesarean section      History reviewed. No pertinent family history.  Social History History  Substance Use Topics  . Smoking status: Never Smoker   . Smokeless tobacco: Never Used  . Alcohol Use: No    No Known Allergies  Current Outpatient Prescriptions  Medication Sig Dispense Refill  . fluticasone (FLONASE) 50 MCG/ACT nasal spray Place 2 sprays into the nose daily.      Marland Kitchen ibuprofen (ADVIL,MOTRIN) 600 MG tablet Take 1 tablet (600 mg total) by mouth every 6 (six) hours as needed for pain.  30 tablet  0  . SUMAtriptan (IMITREX) 50 MG tablet Take 50 mg by mouth every 2 (two) hours as needed.       No current facility-administered medications for this visit.    Review of Systems Review of Systems  Gastrointestinal: Positive for abdominal pain.  Genitourinary: Negative for urgency, difficulty urinating, pelvic pain and dyspareunia.    Blood pressure 126/84, height 5\' 4"  (1.626 m), weight 171 lb 6.4 oz (77.747 kg), last menstrual period 09/07/2013.  Physical Exam Physical Exam  Constitutional: She is oriented to person, place, and time. She appears well-developed and well-nourished.  HENT:  Head: Normocephalic.  Pulmonary/Chest: Effort normal.  Abdominal: Soft. Bowel sounds are normal. She exhibits no distension and no mass. There is  no tenderness. There is no rebound and no guarding.  Genitourinary: Vagina normal and uterus normal.  nontender uterus on bimanual, does not reproduce the pain  Neurological: She is alert and oriented to person, place, and time. She has normal reflexes.  Psychiatric: She has a normal mood and affect. Her behavior is normal. Judgment and thought content normal.  menses are regular monthly tolerated well.  Data Reviewed Prior u/s  Assessment     GI discomfort. Not gyn pain in rlq. Epigastric pain likely GERD Plan : Omeprazole.     Plan           [name] 09/20/2013, 2:23 PM

## 2013-09-20 NOTE — Patient Instructions (Signed)

## 2013-09-20 NOTE — Progress Notes (Signed)
Patient ID: Amber Russell, female   DOB: 05-04-1971, 43 y.o.   MRN: [id number] Pt c/o abdominal pain and upper gastric pain.

## 2013-12-06 ENCOUNTER — Ambulatory Visit (INDEPENDENT_AMBULATORY_CARE_PROVIDER_SITE_OTHER): Payer: BC Managed Care – PPO

## 2013-12-06 ENCOUNTER — Ambulatory Visit (INDEPENDENT_AMBULATORY_CARE_PROVIDER_SITE_OTHER): Payer: BC Managed Care – PPO | Admitting: Orthopedic Surgery

## 2013-12-06 VITALS — BP 120/80 | Ht 65.0 in | Wt 170.0 lb

## 2013-12-06 DIAGNOSIS — M25569 Pain in unspecified knee: Secondary | ICD-10-CM

## 2013-12-06 DIAGNOSIS — M1710 Unilateral primary osteoarthritis, unspecified knee: Secondary | ICD-10-CM

## 2013-12-06 DIAGNOSIS — M25561 Pain in right knee: Secondary | ICD-10-CM

## 2013-12-06 DIAGNOSIS — M1711 Unilateral primary osteoarthritis, right knee: Secondary | ICD-10-CM

## 2013-12-06 DIAGNOSIS — IMO0002 Reserved for concepts with insufficient information to code with codable children: Secondary | ICD-10-CM

## 2013-12-06 MED ORDER — NABUMETONE 500 MG PO TABS: 500.0000 mg | ORAL_TABLET | Freq: Two times a day (BID) | ORAL | Status: DC

## 2013-12-06 NOTE — Progress Notes (Signed)
Patient ID: Amber Russell, female   DOB: August 24, 1971, 43 y.o.   MRN: [id number]  Chief Complaint  Patient presents with  . Knee Pain    Right knee pain , no injury    HISTORY: 43 year old female status post left knee arthroscopy I believe this is bilateral knee pain right worse than left times a couple of months. Symptoms came on gradually without trauma. She complains of a 10 stabbing pain which is worse when she walks on her leg and not a lot she does complain of some numbness in the right knee radiating down to the right foot but denies back pain  She complains of muscle pain dizziness numbness easy bruising seasonal allergies the other systems were reviewed were negative she has no allergies  She had left knee surgery she had a cholecystectomy she is married she doesn't smoke or drink her family history is listed as normal   BP 120/80  Ht 5\' 5"  (1.651 m)  Wt 170 lb (77.111 kg)  BMI 28.29 kg/m2  LMP 11/28/2013   General the patient is well-developed and well-nourished grooming and hygiene are normal Oriented x3 Mood and affect normal Ambulation normal  Inspection of the both knees reveal full range of motion w/ crepitation. No ligament instability normal motor exam. Skin clean dry and intact  Cardiovascular exam is normal Sensory exam normal  The right knee x-ray does not show any significant arthritic changes no joint space narrowing or spurs  I suspect she has some patellofemoral disease. I will start her on Relafen 500 mg twice a day and see her again in 6 weeks

## 2013-12-06 NOTE — Patient Instructions (Addendum)
    Pick up your prescription at pharmacy  Wear and Tear Disorders of the Knee (Arthritis, Osteoarthritis) Everyone will experience wear and tear injuries (arthritis, osteoarthritis) of the knee. These are the changes we all get as we age. They come from the joint stress of daily living. The amount of cartilage damage in your knee and your symptoms determine if you need surgery. Mild problems require approximately two months recovery time. More severe problems take several months to recover. With mild problems, your surgeon may find worn and rough cartilage surfaces. With severe changes, your surgeon may find cartilage that has completely worn away and exposed the bone. Loose bodies of bone and cartilage, bone spurs (excess bone growth), and injuries to the menisci (cushions between the large bones of your leg) are also common. All of these problems can cause pain. For a mild wear and tear problem, rough cartilage may simply need to be shaved and smoothed. For more severe problems with areas of exposed bone, your surgeon may use an instrument for roughing up the bone surfaces to stimulate new cartilage growth. Loose bodies are usually removed. Torn menisci may be trimmed or repaired. ABOUT THE ARTHROSCOPIC PROCEDURE Arthroscopy is a surgical technique. It allows your orthopedic surgeon to diagnose and treat your knee injury with accuracy. The surgeon looks into your knee through a small scope. The scope is like a small (pencil-sized) telescope. Arthroscopy is less invasive than open knee surgery. You can expect a more rapid recovery. After the procedure, you will be moved to a recovery area until most of the effects of the medication have worn off. Your caregiver will discuss the test results with you. RECOVERY The severity of the arthritis and the type of procedure performed will determine recovery time. Other important factors include age, physical condition, medical conditions, and the type of  rehabilitation program. Strengthening your muscles after arthroscopy helps guarantee a better recovery. Follow your caregiver's instructions. Use crutches, rest, elevate, ice, and do knee exercises as instructed. Your caregivers will help you and instruct you with exercises and other physical therapy required to regain your mobility, muscle strength, and functioning following surgery. Only take over-the-counter or prescription medicines for pain, discomfort, or fever as directed by your caregiver.  SEEK MEDICAL CARE IF:   There is increased bleeding (more than a small spot) from the wound.  You notice redness, swelling, or increasing pain in the wound.  Pus is coming from wound.  You develop an unexplained oral temperature above 102 F (38.9 C) , or as your caregiver suggests.  You notice a foul smell coming from the wound or dressing.  You have severe pain with motion of the knee. SEEK IMMEDIATE MEDICAL CARE IF:   You develop a rash.  You have difficulty breathing.  You have any allergic problems. MAKE SURE YOU:   Understand these instructions.  Will watch your condition.  Will get help right away if you are not doing well or get worse. Document Released: 08/29/2000 Document Revised: 11/24/2011 Document Reviewed: 01/26/2008 [facility] Patient Information 2014 [place], [place].

## 2014-01-17 ENCOUNTER — Ambulatory Visit: Payer: BC Managed Care – PPO | Admitting: Orthopedic Surgery

## 2014-01-24 ENCOUNTER — Ambulatory Visit (INDEPENDENT_AMBULATORY_CARE_PROVIDER_SITE_OTHER): Payer: BC Managed Care – PPO | Admitting: Orthopedic Surgery

## 2014-01-24 VITALS — BP 114/82 | Ht 65.0 in | Wt 170.0 lb

## 2014-01-24 DIAGNOSIS — M1711 Unilateral primary osteoarthritis, right knee: Secondary | ICD-10-CM

## 2014-01-24 DIAGNOSIS — M25569 Pain in unspecified knee: Secondary | ICD-10-CM

## 2014-01-24 DIAGNOSIS — M1710 Unilateral primary osteoarthritis, unspecified knee: Secondary | ICD-10-CM

## 2014-01-24 DIAGNOSIS — M25561 Pain in right knee: Secondary | ICD-10-CM

## 2014-01-24 DIAGNOSIS — IMO0002 Reserved for concepts with insufficient information to code with codable children: Secondary | ICD-10-CM

## 2014-01-24 MED ORDER — NABUMETONE 500 MG PO TABS: 500.0000 mg | ORAL_TABLET | Freq: Two times a day (BID) | ORAL | Status: DC

## 2014-01-24 NOTE — Patient Instructions (Signed)
Call DR. Legrand Rams 's office today

## 2014-01-24 NOTE — Progress Notes (Signed)
Patient ID: Amber Russell, female   DOB: Aug 07, 1971, 43 y.o.   MRN: [id number] Chief Complaint  Patient presents with  . Follow-up    5 week recheck right knee arthritis s/p relafen   43 year old female had left knee arthroscopy at another facility surgery was several years back. Presented to [name] with arthritic symptoms was treated with Relafen 500 mg twice a day for patellofemoral disease. X-rays show no significant joint space narrowing  Today complains of epigastric pain at night associated with some shortness of breath  Blood pressure normal no history of chest pain recommend see her primary care doctor Dr. Legrand Rams  Knee symptoms have all but abated. She is getting better. Knee exam looks normal. Recommend followup when necessary continue Relafen twice a day

## 2014-07-17 ENCOUNTER — Encounter: Payer: Self-pay | Admitting: Obstetrics and Gynecology

## 2014-11-23 ENCOUNTER — Ambulatory Visit (INDEPENDENT_AMBULATORY_CARE_PROVIDER_SITE_OTHER): Payer: 59 | Admitting: Obstetrics and Gynecology

## 2014-11-23 ENCOUNTER — Encounter: Payer: Self-pay | Admitting: Obstetrics and Gynecology

## 2014-11-23 VITALS — BP 120/82 | Ht 66.0 in | Wt 164.5 lb

## 2014-11-23 DIAGNOSIS — B373 Candidiasis of vulva and vagina: Secondary | ICD-10-CM | POA: Diagnosis not present

## 2014-11-23 DIAGNOSIS — B3731 Acute candidiasis of vulva and vagina: Secondary | ICD-10-CM | POA: Insufficient documentation

## 2014-11-23 DIAGNOSIS — N898 Other specified noninflammatory disorders of vagina: Secondary | ICD-10-CM

## 2014-11-23 LAB — POCT WET PREP WITH KOH
Epithelial Wet Prep HPF POC: NORMAL
KOH Prep POC: POSITIVE
Trichomonas, UA: NEGATIVE
Yeast Wet Prep HPF POC: POSITIVE

## 2014-11-23 MED ORDER — FLUCONAZOLE 150 MG PO TABS: 150.0000 mg | ORAL_TABLET | ORAL | Status: DC

## 2014-11-23 MED ORDER — NYSTATIN-TRIAMCINOLONE 100000-0.1 UNIT/GM-% EX OINT: 1.0000 "application " | TOPICAL_OINTMENT | Freq: Two times a day (BID) | CUTANEOUS | Status: DC

## 2014-11-23 NOTE — Progress Notes (Signed)
Patient ID: Amber Russell, female   DOB: [birth year], 44 y.o.   MRN: [id number] Pt here today for vaginal itching and discharge. Pt states that she has a rash and itching since a couple days ago. LMP  Little bit coming now x 1 d.. Sexually active , has nexplanon.

## 2014-11-23 NOTE — Progress Notes (Signed)
Patient ID: Amber Russell, female   DOB: 15-Apr-1971, 44 y.o.   MRN: [id number] This chart was SCRIBED for Mallory Shirk, MD by Stephania Fragmin, ED Scribe. This patient was seen in room 3 and the patient's care was started at 4:30 PM.    [facility] Visit  Patient name: Amber Russell MRN [id number]  Date of birth: Feb 04, 1971  CC & HPI:  Amber Russell is a 44 y.o. female presenting today for vaginal itching and discharge. Pt states that she has a constant rash and itching since a couple days ago. Her LMP was about 1 day ago, although she qualifies that she is unsure whether it was a period since it seemed more like spotting. Patient reports being sexually active, and has nexplanon.  ROS:  +Vaginal itching and rash All other systems are negative except as noted in the HPI and PMH.    Pertinent History Reviewed:   Reviewed: Significant for Cesarean section Medical         Past Medical History  Diagnosis Date   Hep C w/o coma, chronic    Hx of migraines                               Surgical Hx:    Past Surgical History  Procedure Laterality Date   Cholecystectomy     Cesarean section     Medications: Reviewed & Updated - see associated section                       Current outpatient prescriptions:    fluticasone (FLONASE) 50 MCG/ACT nasal spray, Place 2 sprays into the nose daily., Disp: , Rfl:    SUMAtriptan (IMITREX) 50 MG tablet, Take 50 mg by mouth every 2 (two) hours as needed., Disp: , Rfl:    Social History: Reviewed -  reports that she has never smoked. She has never used smokeless tobacco.  Objective Findings:  Vitals: Blood pressure 120/82, height 5\' 6"  (1.676 m), weight 164 lb 8 oz (74.617 kg), last menstrual period 11/20/2014.  Physical Examination: General appearance - alert, well appearing, and in no distress, oriented to person, place, and time and normal appearing weight Mental status - alert, oriented to person, place, and time, normal mood,  behavior, speech, dress, motor activity, and thought processes, affect appropriate to mood Pelvic - Very swollen irritated external genitalia   Assessment & Plan:   A:  1. Monilial vulvavaginitis  P:  1. Rx Diflucan and Mycolog  I personally performed the services described in this documentation, which was SCRIBED in my presence. The recorded information has been reviewed and considered accurate. It has been edited as necessary during review. Jonnie Kind, MD

## 2014-11-23 NOTE — Patient Instructions (Signed)

## 2014-12-20 ENCOUNTER — Encounter: Payer: Self-pay | Admitting: Women's Health

## 2014-12-20 ENCOUNTER — Ambulatory Visit (INDEPENDENT_AMBULATORY_CARE_PROVIDER_SITE_OTHER): Payer: 59 | Admitting: Women's Health

## 2014-12-20 VITALS — BP 108/64 | HR 60 | Wt 163.0 lb

## 2014-12-20 DIAGNOSIS — N921 Excessive and frequent menstruation with irregular cycle: Secondary | ICD-10-CM

## 2014-12-20 MED ORDER — MEGESTROL ACETATE 40 MG PO TABS: ORAL_TABLET | ORAL | Status: DC

## 2014-12-20 NOTE — Progress Notes (Signed)
Patient ID: Amber Russell, female   DOB: 1971-03-22, 44 y.o.   MRN: [id number]   [facility] Visit  Patient name: Amber Russell MRN [id number]  Date of birth: 21-Nov-1970  CC & HPI:  Amber Russell is a 44 y.o. [place]  female presenting today for heavy bleeding on nexplanon that was placed 04/2014. Periods were light at first, now irregular and heavier, changes full pad q few hours, some clots, some cramping.  She wants pelvic u/s to make sure everything's ok w/ uterus/ovaries. Husband wants another baby, she's not so sure. Thinks she may want nexplanon out, wants to think about it.   Pertinent History Reviewed:  Medical & Surgical Hx:   Past Medical History  Diagnosis Date  . Hep C w/o coma, chronic   . Hx of migraines    Past Surgical History  Procedure Laterality Date  . Cholecystectomy    . Cesarean section     Medications: Reviewed & Updated - see associated section Social History: Reviewed -  reports that she has never smoked. She has never used smokeless tobacco.  Objective Findings:  Vitals: BP 108/64 mmHg  Pulse 60  Wt 163 lb (73.936 kg)  LMP 12/18/2014  Physical Examination: General appearance - alert, well appearing, and in no distress  No results found for this or any previous visit (from the past 24 hour(s)).   Assessment & Plan:  A:   Menorrhagia, irregular cycles on nexplanon P:  rx megace algorithm to begin today  F/u 1wk for pelvic u/s and f/u w/ me    Tawnya Crook CNM, [facility] 12/20/2014 4:36 PM

## 2014-12-21 LAB — GC/CHLAMYDIA PROBE AMP
Chlamydia trachomatis, NAA: NEGATIVE
Neisseria gonorrhoeae by PCR: NEGATIVE

## 2014-12-27 ENCOUNTER — Other Ambulatory Visit: Payer: Self-pay | Admitting: Women's Health

## 2014-12-27 ENCOUNTER — Ambulatory Visit (INDEPENDENT_AMBULATORY_CARE_PROVIDER_SITE_OTHER): Payer: 59

## 2014-12-27 ENCOUNTER — Ambulatory Visit (INDEPENDENT_AMBULATORY_CARE_PROVIDER_SITE_OTHER): Payer: 59 | Admitting: Women's Health

## 2014-12-27 ENCOUNTER — Encounter: Payer: Self-pay | Admitting: Women's Health

## 2014-12-27 VITALS — BP 126/82 | HR 64 | Wt 164.0 lb

## 2014-12-27 DIAGNOSIS — Z975 Presence of (intrauterine) contraceptive device: Secondary | ICD-10-CM

## 2014-12-27 DIAGNOSIS — N938 Other specified abnormal uterine and vaginal bleeding: Secondary | ICD-10-CM

## 2014-12-27 DIAGNOSIS — N921 Excessive and frequent menstruation with irregular cycle: Secondary | ICD-10-CM

## 2014-12-27 NOTE — Progress Notes (Signed)
US pelvis/tv,normal anteverted uterus,eec 2.29mm,normal ov's bilat,dominate follicle lt ov 2cm,no free fluid seen

## 2014-12-27 NOTE — Progress Notes (Signed)
Patient ID: Amber Russell, female   DOB: 08-18-1971, 44 y.o.   MRN: [id number]   [facility] Visit  Patient name: Amber Russell MRN [id number]  Date of birth: 07-22-1971  CC & HPI:  Amber Russell is a 44 y.o. Asian female presenting today for f/u after pelvic u/s for menorrhagia on nexplanon per her request. Nexplanon placed 04/2014, light periods until recently when became heavier and irregular. GC/CT last week neg. She was given rx for megace algorithm last week, which has stopped bleeding. Husband wants another baby, pt is unsure since she is >40 now.   Pertinent History Reviewed:  Medical & Surgical Hx:   Past Medical History  Diagnosis Date  . Hep C w/o coma, chronic   . Hx of migraines    Past Surgical History  Procedure Laterality Date  . Cholecystectomy    . Cesarean section     Medications: Reviewed & Updated - see associated section Social History: Reviewed -  reports that she has never smoked. She has never used smokeless tobacco.  Objective Findings:  Vitals: BP 126/82 mmHg  Pulse 64  Wt 164 lb (74.39 kg)  LMP 12/18/2014  Physical Examination: General appearance - alert, well appearing, and in no distress  Today's pelvic u/s:  Amber Russell is a 44 y.o. [id number] LMP 12/11/2014 for a pelvic sonogram for menorrhagia. Uterus 9 x 5.27 x 3.8 cm, wnl Endometrium 2.7 mm, symmetrical, wnl Right ovary 2 x 1.7 x 3.2 cm, wnl Left ovary 2.4 x 1.9 x 2.4 cm, wnl, dominate follicle 2 x 1.3 x 8.7GO  Technician Comments:US pelvis/tv,normal anteverted uterus,eec 2.4mm,normal ov's bilat,dominate follicle lt ov 2cm,no free fluid seen [employer] 12/27/2014 12:29 PM   Assessment & Plan:  A:   DUB on nexplanon  Normal pelvic u/s  Contemplating pregnancy P:  Use megace as needed for DUB  Discussed risks of AMA pregnancy, gave printed info- to discuss w/ husband and if she wants to try for another  pregnancy to call to schedule nexplanon removal   F/U Jan for pap & physical   Tawnya Crook CNM, [facility] 12/27/2014 12:45 PM

## 2015-02-15 ENCOUNTER — Ambulatory Visit (INDEPENDENT_AMBULATORY_CARE_PROVIDER_SITE_OTHER): Payer: 59 | Admitting: Adult Health

## 2015-02-15 ENCOUNTER — Encounter: Payer: Self-pay | Admitting: Adult Health

## 2015-02-15 VITALS — BP 102/70 | HR 56 | Ht 66.0 in | Wt 168.0 lb

## 2015-02-15 DIAGNOSIS — Z30011 Encounter for initial prescription of contraceptive pills: Secondary | ICD-10-CM

## 2015-02-15 DIAGNOSIS — Z3202 Encounter for pregnancy test, result negative: Secondary | ICD-10-CM | POA: Diagnosis not present

## 2015-02-15 DIAGNOSIS — Z309 Encounter for contraceptive management, unspecified: Secondary | ICD-10-CM | POA: Insufficient documentation

## 2015-02-15 DIAGNOSIS — Z3049 Encounter for surveillance of other contraceptives: Secondary | ICD-10-CM | POA: Diagnosis not present

## 2015-02-15 DIAGNOSIS — Z3046 Encounter for surveillance of implantable subdermal contraceptive: Secondary | ICD-10-CM

## 2015-02-15 HISTORY — DX: Encounter for contraceptive management, unspecified: Z30.9

## 2015-02-15 HISTORY — DX: Encounter for surveillance of implantable subdermal contraceptive: Z30.46

## 2015-02-15 LAB — POCT URINE PREGNANCY: Preg Test, Ur: NEGATIVE

## 2015-02-15 MED ORDER — NORETHIN-ETH ESTRAD-FE BIPHAS 1 MG-10 MCG / 10 MCG PO TABS: 1.0000 | ORAL_TABLET | Freq: Every day | ORAL | Status: DC

## 2015-02-15 NOTE — Patient Instructions (Addendum)
Use condoms x 4 weeks, keep clean and dry x 24 hours, no heavy lifting, keep steri strips on x 72 hours, Keep pressure dressing on x 24 hours. Follow up prn problems. Start lo loestrin today

## 2015-02-15 NOTE — Progress Notes (Signed)
Subjective:     Patient ID: Amber Russell, female   DOB: 08-Oct-1970, 44 y.o.   MRN: [id number]  HPI Amber Russell is a 44 year old female in to have nexplanon removed and get on OCs, she does not want more children but her husband does.  Review of Systems For nexplanon removal Patient denies any daily headaches(has migraines some), hearing loss, fatigue, blurred vision, shortness of breath, chest pain, abdominal pain, problems with bowel movements, urination, or intercourse. No joint pain or mood swings.  Reviewed past medical,surgical, social and family history. Reviewed medications and allergies.     Objective:   Physical Exam BP 102/70 mmHg  Pulse 56  Ht 5\' 6"  (1.676 m)  Wt 168 lb (76.204 kg)  BMI 27.13 kg/m2 UPT negative, consent signed, time out called, left arm cleansed with betadine, and injected with 1.5 cc 2% lidocaine and waited til numb.Under sterile technique a #11 blade was used to make small vertical incision, and a curved forceps was used to easily remove rod. Steri strips applied. Pressure dressing applied.Will start lo loestrin today.    Assessment:     Nexplanon removal Contraceptive management    Plan:     Rx lo loestrin take 1 daily with 11 refills, start today, 3 packs given lot [id number] A exp 9/16 Use condoms x 4 weeks, keep clean and dry x 24 hours, no heavy lifting, keep steri strips on x 72 hours, Keep pressure dressing on x 24 hours. Follow up prn problems.

## 2015-08-07 ENCOUNTER — Other Ambulatory Visit (HOSPITAL_COMMUNITY)
Admission: RE | Admit: 2015-08-07 | Discharge: 2015-08-07 | Disposition: A | Discharge status: Home / Self Care | Payer: 59 | Source: Ambulatory Visit | Attending: Obstetrics & Gynecology | Admitting: Obstetrics & Gynecology

## 2015-08-07 ENCOUNTER — Ambulatory Visit (INDEPENDENT_AMBULATORY_CARE_PROVIDER_SITE_OTHER): Payer: 59 | Admitting: Obstetrics & Gynecology

## 2015-08-07 ENCOUNTER — Encounter: Payer: Self-pay | Admitting: Obstetrics & Gynecology

## 2015-08-07 VITALS — BP 130/90 | HR 60 | Ht 65.3 in | Wt 165.4 lb

## 2015-08-07 DIAGNOSIS — Z3202 Encounter for pregnancy test, result negative: Secondary | ICD-10-CM | POA: Diagnosis not present

## 2015-08-07 DIAGNOSIS — Z01419 Encounter for gynecological examination (general) (routine) without abnormal findings: Secondary | ICD-10-CM

## 2015-08-07 LAB — POCT URINE PREGNANCY: Preg Test, Ur: NEGATIVE

## 2015-08-07 MED ORDER — NORETHIN-ETH ESTRAD-FE BIPHAS 1 MG-10 MCG / 10 MCG PO TABS: 1.0000 | ORAL_TABLET | Freq: Every day | ORAL | Status: DC

## 2015-08-07 NOTE — Addendum Note (Signed)
Addended by: Diona Fanti A on: 08/07/2015 01:44 PM   Modules accepted: Orders

## 2015-08-07 NOTE — Progress Notes (Signed)
Patient ID: Amber Russell, female   DOB: Sep 12, 1971, 44 y.o.   MRN: [id number] Subjective:     Amber Russell is a 44 y.o. female here for a routine exam.  Patient's last menstrual period was 04/21/2015. [id number] Birth Control Method:  OCP Menstrual Calendar(currently): amenorrheic on OCP  Current complaints: none.   Current acute medical issues:  none   Recent Gynecologic History Patient's last menstrual period was 04/21/2015. Last Pap: 2016,  normal Last mammogram: reommended,  Never had one  Past Medical History  Diagnosis Date  . Hep C w/o coma, chronic ([place])   . Hx of migraines   . Nexplanon removal 02/15/2015  . Contraceptive management 02/15/2015    Past Surgical History  Procedure Laterality Date  . Cholecystectomy    . Cesarean section      OB History    Gravida Para Term Preterm AB TAB SAB Ectopic Multiple Living   3 3 3       3       Social History   Social History  . Marital Status: Married    Spouse Name: N/A  . Number of Children: N/A  . Years of Education: N/A   Social History Main Topics  . Smoking status: Never Smoker   . Smokeless tobacco: Never Used  . Alcohol Use: No  . Drug Use: No  . Sexual Activity: Yes    Birth Control/ Protection: Implant   Other Topics Concern  . None   Social History Narrative    History reviewed. No pertinent family history.   Current outpatient prescriptions:  .  Norethindrone-Ethinyl Estradiol-Fe Biphas (LO LOESTRIN FE) 1 MG-10 MCG / 10 MCG tablet, Take 1 tablet by mouth daily., Disp: 1 Package, Rfl: 11 .  etonogestrel (NEXPLANON) 68 MG IMPL implant, 1 each by Subdermal route once., Disp: , Rfl:  .  SUMAtriptan (IMITREX) 50 MG tablet, Take 50 mg by mouth every 2 (two) hours as needed., Disp: , Rfl:   Review of Systems  Review of Systems  Constitutional: Negative for fever, chills, weight loss, malaise/fatigue and diaphoresis.  HENT: Negative for hearing loss, ear pain, nosebleeds, congestion, sore  throat, neck pain, tinnitus and ear discharge.   Eyes: Negative for blurred vision, double vision, photophobia, pain, discharge and redness.  Respiratory: Negative for cough, hemoptysis, sputum production, shortness of breath, wheezing and stridor.   Cardiovascular: Negative for chest pain, palpitations, orthopnea, claudication, leg swelling and PND.  Gastrointestinal: negative for abdominal pain. Negative for heartburn, nausea, vomiting, diarrhea, constipation, blood in stool and melena.  Genitourinary: Negative for dysuria, urgency, frequency, hematuria and flank pain.  Musculoskeletal: Negative for myalgias, back pain, joint pain and falls.  Skin: Negative for itching and rash.  Neurological: Negative for dizziness, tingling, tremors, sensory change, speech change, focal weakness, seizures, loss of consciousness, weakness and headaches.  Endo/Heme/Allergies: Negative for environmental allergies and polydipsia. Does not bruise/bleed easily.  Psychiatric/Behavioral: Negative for depression, suicidal ideas, hallucinations, memory loss and substance abuse. The patient is not nervous/anxious and does not have insomnia.        Objective:  Blood pressure 130/90, pulse 60, height 5' 5.3" (1.659 m), weight 165 lb 6.4 oz (75.025 kg), last menstrual period 04/21/2015.   Physical Exam  Vitals reviewed. Constitutional: She is oriented to person, place, and time. She appears well-developed and well-nourished.  HENT:  Head: Normocephalic and atraumatic.        Right Ear: External ear normal.  Left Ear: External ear normal.  Nose: Nose normal.  Mouth/Throat: Oropharynx is clear and moist.  Eyes: Conjunctivae and EOM are normal. Pupils are equal, round, and reactive to light. Right eye exhibits no discharge. Left eye exhibits no discharge. No scleral icterus.  Neck: Normal range of motion. Neck supple. No tracheal deviation present. No thyromegaly present.  Cardiovascular: Normal rate, regular rhythm,  normal heart sounds and intact distal pulses.  Exam reveals no gallop and no friction rub.   No murmur heard. Respiratory: Effort normal and breath sounds normal. No respiratory distress. She has no wheezes. She has no rales. She exhibits no tenderness.  GI: Soft. Bowel sounds are normal. She exhibits no distension and no mass. There is no tenderness. There is no rebound and no guarding.  Genitourinary:  Breasts no masses skin changes or nipple changes bilaterally      Vulva is normal without lesions Vagina is pink moist without discharge Cervix normal in appearance and pap is done Uterus is normal size shape and contour Adnexa is negative with normal sized ovaries   Musculoskeletal: Normal range of motion. She exhibits no edema and no tenderness.  Neurological: She is alert and oriented to person, place, and time. She has normal reflexes. She displays normal reflexes. No cranial nerve deficit. She exhibits normal muscle tone. Coordination normal.  Skin: Skin is warm and dry. No rash noted. No erythema. No pallor.  Psychiatric: She has a normal mood and affect. Her behavior is normal. Judgment and thought content normal.       Assessment:    Healthy female exam.    Plan:    OCP, mammogram, follow up 1 year

## 2015-08-13 LAB — CYTOLOGY - PAP

## 2015-10-01 ENCOUNTER — Encounter: Payer: Self-pay | Admitting: Orthopedic Surgery

## 2015-10-01 ENCOUNTER — Ambulatory Visit (INDEPENDENT_AMBULATORY_CARE_PROVIDER_SITE_OTHER): Payer: BLUE CROSS/BLUE SHIELD | Admitting: Orthopedic Surgery

## 2015-10-01 ENCOUNTER — Ambulatory Visit (INDEPENDENT_AMBULATORY_CARE_PROVIDER_SITE_OTHER): Payer: BLUE CROSS/BLUE SHIELD

## 2015-10-01 VITALS — BP 118/75 | Ht 65.3 in | Wt 164.0 lb

## 2015-10-01 DIAGNOSIS — M542 Cervicalgia: Secondary | ICD-10-CM

## 2015-10-01 DIAGNOSIS — M6249 Contracture of muscle, multiple sites: Secondary | ICD-10-CM | POA: Diagnosis not present

## 2015-10-01 DIAGNOSIS — M62838 Other muscle spasm: Secondary | ICD-10-CM

## 2015-10-01 MED ORDER — METHOCARBAMOL 500 MG PO TABS
500.0000 mg | ORAL_TABLET | Freq: Four times a day (QID) | ORAL | Status: DC | PRN
Start: 2015-10-01 — End: 2015-10-23

## 2015-10-01 MED ORDER — IBUPROFEN 800 MG PO TABS: 800.0000 mg | ORAL_TABLET | Freq: Three times a day (TID) | ORAL | Status: DC

## 2015-10-01 NOTE — Progress Notes (Signed)
Patient ID: Amber Russell, female   DOB: [birth year], 45 y.o.   MRN: [id number]  Chief Complaint  Patient presents with  . Neck Pain    neck pain, radiates to left shoulder, x 2 months, no known injury    HPI Amber Russell is a 45 y.o. female.  She presents with history of left-sided neck pain migraine headaches without radicular symptoms in a. The only thing she's done is taken a hot shower her pain is 8 out of 10 she has no burning numbness tingling or weakness in the left upper extremity nothing so far is made better she denies any trauma. Her review of systems was negative  Review of Systems Review of Systems  All other systems reviewed and are negative.   Past Medical History  Diagnosis Date  . Hep C w/o coma, chronic ([place])   . Hx of migraines   . Nexplanon removal 02/15/2015  . Contraceptive management 02/15/2015    Past Surgical History  Procedure Laterality Date  . Cholecystectomy    . Cesarean section      No family history on file.  Social History Social History  Substance Use Topics  . Smoking status: Never Smoker   . Smokeless tobacco: Never Used  . Alcohol Use: No    No Known Allergies  Current Outpatient Prescriptions  Medication Sig Dispense Refill  . aspirin-acetaminophen-caffeine (EXCEDRIN MIGRAINE) 250-250-65 MG tablet Take by mouth every 6 (six) hours as needed for headache.    . etonogestrel (NEXPLANON) 68 MG IMPL implant 1 each by Subdermal route once.    . Norethindrone-Ethinyl Estradiol-Fe Biphas (LO LOESTRIN FE) 1 MG-10 MCG / 10 MCG tablet Take 1 tablet by mouth daily. 1 Package 11  . ibuprofen (ADVIL,MOTRIN) 800 MG tablet Take 1 tablet (800 mg total) by mouth 3 (three) times daily. 90 tablet 0  . methocarbamol (ROBAXIN) 500 MG tablet Take 1 tablet (500 mg total) by mouth every 6 (six) hours as needed for muscle spasms. 56 tablet 2   No current facility-administered medications for this visit.       Physical Exam Physical Exam Blood  pressure 118/75, height 5' 5.3" (1.659 m), weight 164 lb (74.39 kg). Appearance, there are no abnormalities in terms of appearance the patient was well-developed and well-nourished. The grooming and hygiene were normal.  Mental status orientation, there was normal alertness and orientation Mood pleasant Ambulatory status normal with no assistive devices  Examination of the cervical spine reveal tenderness left paracervical musculature with increased tension there as well as the left trapezius muscle  Spinal provocative tests were negative   The upper extremities showed no malalignment range of motion was full stability and strength were normal  Neurologic examination  Reflexes were 2+ and equal  Sensation was normal in both feet and legs  The vascular examination revealed normal pulses and capillary refill   Data Reviewed Cervical spine shows straightening of cervical lordosis but no significant disc space narrowing  Assessment  Cervical spasm   Plan  Ibuprofen Robaxin for 6 weeks Follow-up for repeat evaluation

## 2015-10-01 NOTE — Patient Instructions (Signed)
TWO NEW MEDICATIONS SENT TO YOUR PHARMACY  HOME EXERCISES

## 2015-10-02 ENCOUNTER — Ambulatory Visit: Payer: BLUE CROSS/BLUE SHIELD | Admitting: Orthopedic Surgery

## 2015-10-23 ENCOUNTER — Other Ambulatory Visit: Payer: Self-pay | Admitting: Obstetrics & Gynecology

## 2015-10-23 ENCOUNTER — Other Ambulatory Visit (INDEPENDENT_AMBULATORY_CARE_PROVIDER_SITE_OTHER): Payer: BLUE CROSS/BLUE SHIELD

## 2015-10-23 ENCOUNTER — Encounter: Payer: Self-pay | Admitting: Obstetrics & Gynecology

## 2015-10-23 ENCOUNTER — Ambulatory Visit (INDEPENDENT_AMBULATORY_CARE_PROVIDER_SITE_OTHER): Payer: BLUE CROSS/BLUE SHIELD | Admitting: Obstetrics & Gynecology

## 2015-10-23 VITALS — BP 120/90 | HR 76 | Wt 164.0 lb

## 2015-10-23 DIAGNOSIS — D259 Leiomyoma of uterus, unspecified: Secondary | ICD-10-CM

## 2015-10-23 DIAGNOSIS — N939 Abnormal uterine and vaginal bleeding, unspecified: Secondary | ICD-10-CM

## 2015-10-23 DIAGNOSIS — N83201 Unspecified ovarian cyst, right side: Secondary | ICD-10-CM | POA: Diagnosis not present

## 2015-10-23 DIAGNOSIS — N920 Excessive and frequent menstruation with regular cycle: Secondary | ICD-10-CM | POA: Diagnosis not present

## 2015-10-23 MED ORDER — MEGESTROL ACETATE 40 MG PO TABS: ORAL_TABLET | ORAL | Status: DC

## 2015-10-23 NOTE — Progress Notes (Signed)
PELVIC US TA/TV: heterogenous anteverted uterus,EEC 4.54mm,simple cyst rt ov 4 x 2.9 x 3.4cm, lt ov dominate follicle 2 x 1.5 x [id number] appear to be mobile,no free fluid,no adnexal pain during ultrasound

## 2015-10-23 NOTE — Progress Notes (Signed)
Patient ID: Amber Russell, female   DOB: 1971/06/10, 45 y.o.   MRN: [id number]      Chief Complaint  Patient presents with  . pain lower stomach    feel like something coming out    Blood pressure 120/90, pulse 76, weight 164 lb (74.39 kg), last menstrual period 08/30/2015.  45 y.o. [id number] Patient's last menstrual period was 08/30/2015. The current method of family planning is OCP (estrogen/progesterone).  Subjective Pt with episode of midpelvic suprapubic pain lasted for about 1 day, felt like something was falling out No bleeding assoicated Not associated with sex No discharge or odor  Objective Vulva:  S/p Female mutilation  Vagina:  normal mucosa, no discharge Cervix:  no cervical motion tenderness and no lesions Uterus:  midline tenderness and off to the right, ? small myoma vs adenomyosis Adnexa: ovaries:present,  normal adnexa in size, nontender and no masses, tenderness left    Pertinent ROS No burning with urination, frequency or urgency No nausea, vomiting or diarrhea Nor fever chills or other constitutional symptoms   Labs or studies     Impression Diagnoses this Encounter::   ICD-9-CM ICD-10-CM   1. Cyst of right ovary 620.2 N83.201 US Pelvis Complete     US Transvaginal Non-OB  2. Uterine leiomyoma, unspecified location 218.9 D25.9   3. Menorrhagia with regular cycle 626.2 N92.0 US Pelvis Complete     US Transvaginal Non-OB    Established relevant diagnosis(es):   Plan/Recommendations: Meds ordered this encounter  Medications  . megestrol (MEGACE) 40 MG tablet    Sig: 3 tablets a day for 5 days, 2 tablets a day for 5 days then 1 tablet daily    Dispense:  45 tablet    Refill:  3    Labs or Scans Ordered: Orders Placed This Encounter  Procedures  . US Pelvis Complete  . US Transvaginal Non-OB    Management:: Megestrol for suppression Recheck in 6 weeks  Follow up 6  weeks     All questions were answered.

## 2015-10-24 ENCOUNTER — Telehealth: Payer: Self-pay | Admitting: Obstetrics & Gynecology

## 2015-10-24 NOTE — Telephone Encounter (Signed)
Pt states does she take all three tablet of Megace at once, per Derrek Monaco, NP, pt does take all three tablets at once. Pt asked what was the size of cyst on the right ovaries. Pt informed per the U/S note from 10/22/2014 right ovarian cyst 4 x 2.9 x 3.4cm. Pt verbalized understanding.

## 2015-12-26 ENCOUNTER — Ambulatory Visit (INDEPENDENT_AMBULATORY_CARE_PROVIDER_SITE_OTHER): Payer: BLUE CROSS/BLUE SHIELD

## 2015-12-26 ENCOUNTER — Encounter: Payer: Self-pay | Admitting: Orthopaedic Surgery

## 2015-12-26 ENCOUNTER — Ambulatory Visit (INDEPENDENT_AMBULATORY_CARE_PROVIDER_SITE_OTHER): Payer: BLUE CROSS/BLUE SHIELD | Admitting: Orthopaedic Surgery

## 2015-12-26 VITALS — BP 144/103 | HR 65 | Temp 97.9°F | Resp 16 | Ht 65.3 in | Wt 166.0 lb

## 2015-12-26 DIAGNOSIS — M25531 Pain in right wrist: Secondary | ICD-10-CM

## 2015-12-26 MED ORDER — PREDNISONE 10 MG (21) PO TBPK: ORAL_TABLET | ORAL | Status: DC

## 2015-12-26 NOTE — Patient Instructions (Signed)
Tendinitis Tendinitis is swelling and inflammation of the tendons. Tendons are band-like tissues that connect muscle to bone. Tendinitis commonly occurs in the:   Shoulders (rotator cuff).  Heels (Achilles tendon).  Elbows (triceps tendon). CAUSES Tendinitis is usually caused by overusing the tendon, muscles, and joints involved. When the tissue surrounding a tendon (synovium) becomes inflamed, it is called tenosynovitis. Tendinitis commonly develops in people whose jobs require repetitive motions. SYMPTOMS  Pain.  Tenderness.  Mild swelling. DIAGNOSIS Tendinitis is usually diagnosed by physical exam. Your health care provider may also order X-rays or other imaging tests. TREATMENT Your health care provider may recommend certain medicines or exercises for your treatment. HOME CARE INSTRUCTIONS   Use a sling or splint for as Kyston Gonce as directed by your health care provider until the pain decreases.  Put ice on the injured area.  Put ice in a plastic bag.  Place a towel between your skin and the bag.  Leave the ice on for 15-20 minutes, 3-4 times a day, or as directed by your health care provider.  Avoid using the limb while the tendon is painful. Perform gentle range of motion exercises only as directed by your health care provider. Stop exercises if pain or discomfort increase, unless directed otherwise by your health care provider.  Only take over-the-counter or prescription medicines for pain, discomfort, or fever as directed by your health care provider. SEEK MEDICAL CARE IF:   Your pain and swelling increase.  You develop new, unexplained symptoms, especially increased numbness in the hands. MAKE SURE YOU:   Understand these instructions.  Will watch your condition.  Will get help right away if you are not doing well or get worse.   This information is not intended to replace advice given to you by your health care provider. Make sure you discuss any questions you  have with your health care provider.   Document Released: 08/29/2000 Document Revised: 09/22/2014 Document Reviewed: 11/18/2010 Elsevier Interactive Patient Education 2016 [place] Disease Alfonse Ras disease is inflammation of the tendon on the thumb side of the wrist. Tendons are cords of tissue that connect bones to muscles. The tendons in your hand pass through a tunnel, or sheath. A slippery layer of tissue (synovium) lets the tendons move smoothly in the sheath. With de Quervain disease, the sheath swells or thickens, causing friction and pain. The condition is also called de Quervain tendinosis and de Quervain syndrome. It occurs most often in women who are 82-66 years old. CAUSES  The exact cause of de Quervain disease is not known. It may result from:   Overusing your hands, especially with repetitive motions that involve twisting your hand or using a forceful grip.  Pregnancy.  Rheumatoid disease. RISK FACTORS You may have a greater risk for de Quervain disease if you:  Are a middle-aged woman.  Are pregnant.  Have rheumatoid arthritis.  Have diabetes.  Use your hands far more than normal, especially with a tight grip or excessive twisting. SIGNS AND SYMPTOMS Pain on the thumb side of your wrist is the main symptom of de Quervain disease. Other signs and symptoms include:  Pain that gets worse when you grasp something or turn your wrist.  Pain that extends up the forearm.  Cysts in the area of the pain.  Swelling of your wrist and hand.  A sensation of snapping in the wrist.  Trouble moving the thumb and wrist. DIAGNOSIS  Your health care provider may diagnose de Quervain disease  based on your signs and symptoms. A physical exam will also be done. A simple test Wynn Maudlin test) that involves pulling your thumb and wrist to see if this causes pain can help determine whether you have the condition. Sometimes you may need to have an X-ray.   TREATMENT  Avoiding any activity that causes pain and swelling is the best treatment. Other options include:  Wearing a splint.  Taking medicine. Anti-inflammatory medicines and corticosteroid injections may reduce inflammation and relieve pain.  Having surgery if other treatments do not work. HOME CARE INSTRUCTIONS   Using ice can be helpful after doing activities that involve the sore wrist. To apply ice to the injured area:  Put ice in a plastic bag.  Place a towel between your skin and the bag.  Leave the ice on for 20 minutes, 2-3 times a day.  Take medicines only as directed by your health care provider.  Wear your splint as directed. This will allow your hand to rest and heal. SEEK MEDICAL CARE IF:   Your pain medicine does not help.   Your pain gets worse.  You develop new symptoms. MAKE SURE YOU:   Understand these instructions.  Will watch your condition.  Will get help right away if you are not doing well or get worse.   This information is not intended to replace advice given to you by your health care provider. Make sure you discuss any questions you have with your health care provider.   Document Released: 05/27/2001 Document Revised: 09/22/2014 Document Reviewed: 01/04/2014 Elsevier Interactive Patient Education [employer].

## 2015-12-27 ENCOUNTER — Encounter: Payer: Self-pay | Admitting: Orthopaedic Surgery

## 2015-12-27 NOTE — Progress Notes (Signed)
Subjective: my right wrist and hand hurts    Patient ID: Amber Russell, female    DOB: Jan 16, 1971, 45 y.o.   MRN: [id number]  Hand Pain  There was no injury mechanism. The pain is present in the right hand and right wrist. The quality of the pain is described as aching. The pain does not radiate. The pain is at a severity of 4/10. The pain is moderate. The pain has been worsening since the incident. Associated symptoms include tingling. Pertinent negatives include no chest pain, muscle weakness or numbness. The symptoms are aggravated by lifting and movement. She has tried heat and ice for the symptoms. The treatment provided mild relief.   She has pain of the right thumb area at the base and of the radial wrist area.  She has no numbness.  She has tenderness and some swelling.  She has no trauma.  She has tried ice with little help.  Ibuprofen did not help.   Review of Systems  HENT: Negative for congestion.   Respiratory: Negative for cough and shortness of breath.   Cardiovascular: Negative for chest pain and leg swelling.  Endocrine: Negative for cold intolerance.  Musculoskeletal: Positive for arthralgias.  Allergic/Immunologic: Negative for environmental allergies.  Neurological: Positive for tingling and headaches. Negative for numbness.   Past Medical History  Diagnosis Date  . Hep C w/o coma, chronic ([place])   . Hx of migraines   . Nexplanon removal 02/15/2015  . Contraceptive management 02/15/2015   Past Surgical History  Procedure Laterality Date  . Cholecystectomy    . Cesarean section     Social History   Social History  . Marital Status: Married    Spouse Name: N/A  . Number of Children: N/A  . Years of Education: N/A   Occupational History  . Not on file.   Social History Main Topics  . Smoking status: Never Smoker   . Smokeless tobacco: Never Used  . Alcohol Use: No  . Drug Use: No  . Sexual Activity: Yes   Other Topics Concern  . Not on file    Social History Narrative   BP 144/103 mmHg  Pulse 65  Temp(Src) 97.9 F (36.6 C)  Resp 16  Ht 5' 5.3" (1.659 m)  Wt 166 lb (75.297 kg)  BMI 27.36 kg/m2  LMP 12/26/2015     Objective:   Physical Exam  Constitutional: She is oriented to person, place, and time. She appears well-developed and well-nourished.  HENT:  Head: Normocephalic and atraumatic.  Eyes: Conjunctivae and EOM are normal. Pupils are equal, round, and reactive to light.  Neck: Normal range of motion. Neck supple.  Cardiovascular: Normal rate, regular rhythm and intact distal pulses.   Pulmonary/Chest: Effort normal.  Abdominal: Soft.  Musculoskeletal: She exhibits tenderness (She has tenderness of the right wrist on volar radial side and pain of the first extensor compartment with negative Finkelstein sign.  she has no redness.  She has slight swellling.  ).  Neurological: She is alert and oriented to person, place, and time. She displays normal reflexes. No cranial nerve deficit. She exhibits normal muscle tone. Coordination normal.  Skin: Skin is warm and dry.  Psychiatric: She has a normal mood and affect. Her behavior is normal. Judgment and thought content normal.  Right Hand Exam   Tenderness  The patient is experiencing tenderness in the radial area.  Range of Motion  The patient has normal right wrist ROM.   Muscle Strength  The  patient has normal right wrist strength.  Tests  Phalen's Sign: negative Tinel's Sign (Medial Nerve): negative Finkelstein: negative  Other  Erythema: absent Scars: absent Sensation: normal Pulse: present   Left Hand Exam  Left hand exam is normal.     X-rays of the wrist were negative.     Assessment & Plan:   Encounter Diagnosis  Name Primary?  . Right wrist pain Yes  I have told her she has tendinitis of the wrist area on radial side.    I will give splint.  I will give prednisone.  Return in one week.  She may need injection.  Call if any  problem.  Precautions discussed.

## 2016-01-02 ENCOUNTER — Ambulatory Visit: Payer: BLUE CROSS/BLUE SHIELD | Admitting: Orthopaedic Surgery

## 2016-01-24 ENCOUNTER — Ambulatory Visit: Payer: BLUE CROSS/BLUE SHIELD | Admitting: Obstetrics & Gynecology

## 2016-01-24 ENCOUNTER — Other Ambulatory Visit: Payer: BLUE CROSS/BLUE SHIELD

## 2016-01-29 ENCOUNTER — Encounter: Payer: Self-pay | Admitting: Allergy and Immunology

## 2016-01-29 ENCOUNTER — Ambulatory Visit (INDEPENDENT_AMBULATORY_CARE_PROVIDER_SITE_OTHER): Payer: BLUE CROSS/BLUE SHIELD | Admitting: Allergy and Immunology

## 2016-01-29 VITALS — BP 120/70 | HR 76 | Temp 97.6°F | Resp 16 | Ht 65.0 in | Wt 171.2 lb

## 2016-01-29 DIAGNOSIS — J4540 Moderate persistent asthma, uncomplicated: Secondary | ICD-10-CM | POA: Insufficient documentation

## 2016-01-29 DIAGNOSIS — H1045 Other chronic allergic conjunctivitis: Secondary | ICD-10-CM

## 2016-01-29 DIAGNOSIS — J3089 Other allergic rhinitis: Secondary | ICD-10-CM | POA: Insufficient documentation

## 2016-01-29 DIAGNOSIS — H1010 Acute atopic conjunctivitis, unspecified eye: Secondary | ICD-10-CM | POA: Insufficient documentation

## 2016-01-29 DIAGNOSIS — J4541 Moderate persistent asthma with (acute) exacerbation: Secondary | ICD-10-CM

## 2016-01-29 MED ORDER — AZELASTINE-FLUTICASONE 137-50 MCG/ACT NA SUSP: 1.0000 | Freq: Two times a day (BID) | NASAL | Status: DC

## 2016-01-29 MED ORDER — LEVOCETIRIZINE DIHYDROCHLORIDE 5 MG PO TABS: 5.0000 mg | ORAL_TABLET | Freq: Every evening | ORAL | Status: DC

## 2016-01-29 MED ORDER — OLOPATADINE HCL 0.7 % OP SOLN: 1.0000 [drp] | Freq: Every day | OPHTHALMIC | Status: DC

## 2016-01-29 MED ORDER — BUDESONIDE-FORMOTEROL FUMARATE 160-4.5 MCG/ACT IN AERO: 2.0000 | INHALATION_SPRAY | Freq: Two times a day (BID) | RESPIRATORY_TRACT | Status: DC

## 2016-01-29 MED ORDER — PREDNISONE 1 MG PO TABS: 10.0000 mg | ORAL_TABLET | ORAL | Status: DC

## 2016-01-29 NOTE — Assessment & Plan Note (Signed)
   Treatment plan as outlined above for allergic rhinitis.  A prescription has been provided for Pazeo, one drop per eye daily as needed.  I have also recommended eye lubricant drops (i.e., Natural Tears) as needed. 

## 2016-01-29 NOTE — Progress Notes (Signed)
New Patient Note  RE: Amber Russell MRN: [id number] DOB: 08-23-1971 Date of Office Visit: 01/29/2016  Referring provider: Rosita Fire, MD Primary care provider: Rosita Fire, MD  Chief Complaint: Allergic Rhinitis ; Wheezing; Cough; and Sinus Problem   History of present illness: HPI Comments: Amber Russell is a 45 y.o. female presenting today for consultation of rhinitis and wheezing.  She complains of frequent and severe nasal congestion, rhinorrhea, nasal pruritus, sneezing, postnasal drainage, ocular pruritus, and occasional sinus pressure over the cheek bones.  The symptoms occur year around but occur more frequently in the springtime.  Cetirizine, fexofenadine, and loratadine have failed to provide adequate symptom relief.  Fluticasone nasal spray provides moderate temporary relief.  She also experiences coughing, dyspnea, chest tightness, and wheezing.  She reports that this spring she has experienced lower respiratory symptoms daily despite taking montelukast and has had to see her primary care physician on 2 occasions and has sought care at urgent care on 2 occasions for lower respiratory symptoms.   Assessment and plan: Perennial and seasonal allergic rhinitis  Aeroallergen avoidance measures have been discussed and provided in written form.  A prescription has been provided for levocetirizine, 5 mg daily as needed.  A prescription has been provided for Dymista (azelastine/fluticasone) nasal spray, 1 spray per nostril twice daily as needed. Proper nasal spray technique has been discussed and demonstrated.  I have also recommended nasal saline spray (i.e., Simply Saline) or nasal saline lavage (i.e., NeilMed) as needed prior to medicated nasal sprays. The risks and benefits of aeroallergen immunotherapy have been discussed. The patient is motivated to initiate immunotherapy to reduce symptoms and decrease medication requirement. Informed consent has been signed and  allergen vaccine orders have been submitted. Medications will be decreased or discontinued as symptom relief from immunotherapy becomes evident.  Seasonal allergic conjunctivitis  Treatment plan as outlined above for allergic rhinitis.  A prescription has been provided for Pazeo, one drop per eye daily as needed.  I have also recommended eye lubricant drops (i.e., Natural Tears) as needed.  Moderate persistent asthma with mild exacerbation Extrinsic persistent asthma.  A prescription has been provided for Symbicort (budesonide/formoterol) 160/4.5 g, 2 inhalations twice a day.  To maximize pulmonary deposition, a spacer has been provided along with instructions for its proper administration with an HFA inhaler.  For now, continue montelukast 10 mg daily bedtime and albuterol HFA every 4-6 hours as needed.  To hasten symptom relief, prednisone has been provided, 20 mg x 4 days, 10 mg x1 day, then stop.  Subjective and objective measures of pulmonary function will be followed and the treatment plan will be adjusted accordingly.    Meds ordered this encounter  Medications  . levocetirizine (XYZAL) 5 MG tablet    Sig: Take 1 tablet (5 mg total) by mouth every evening.    Dispense:  30 tablet    Refill:  5  . Azelastine-Fluticasone (DYMISTA) 137-50 MCG/ACT SUSP    Sig: Place 1 spray into both nostrils 2 (two) times daily.    Dispense:  1 Bottle    Refill:  5  . Olopatadine HCl (PAZEO) 0.7 % SOLN    Sig: Place 1 drop into both eyes daily.    Dispense:  1 Bottle    Refill:  5  . budesonide-formoterol (SYMBICORT) 160-4.5 MCG/ACT inhaler    Sig: Inhale 2 puffs into the lungs 2 (two) times daily. Use with spacer Rinse Gargle and Spit after use    Dispense:  1 Inhaler  Refill:  5  . predniSONE (DELTASONE) tablet 10 mg    Sig:     Diagnositics: Spirometry: FVC was 2.94 L and FEV1 was 2.38 L (79% predicted) without post bronchodilator improvement. Epicutaneous testing: Positive  to weed pollens, ragweed pollen, and tree pollens. Intradermal testing: Positive to grass pollens and molds.    Physical examination: Blood pressure 120/70, pulse 76, temperature 97.6 F (36.4 C), temperature source Oral, resp. rate 16, height 5\' 5"  (1.651 m).  General: Alert, interactive, in no acute distress. HEENT: TMs pearly gray, turbinates edematous and pale without discharge, post-pharynx moderately erythematous.  Mild bilateral infraorbital cyanosis is present.  Transverse crease is present. Neck: Supple without lymphadenopathy. Lungs: Clear to auscultation without wheezing, rhonchi or rales. CV: Normal S1, S2 without murmurs. Abdomen: Nondistended, nontender. Skin: Warm and dry, without lesions or rashes. Extremities:  No clubbing, cyanosis or edema. Neuro:   Grossly intact.  Review of systems:  Review of Systems  Constitutional: Negative for fever, chills and weight loss.  HENT: Positive for congestion. Negative for nosebleeds.   Eyes: Negative for blurred vision.  Respiratory: Positive for cough, shortness of breath and wheezing. Negative for hemoptysis.   Cardiovascular: Negative for chest pain.  Gastrointestinal: Negative for diarrhea and constipation.  Genitourinary: Negative for dysuria.  Musculoskeletal: Negative for myalgias and joint pain.  Skin: Negative for itching and rash.  Neurological: Positive for headaches. Negative for dizziness.  Endo/Heme/Allergies: Positive for environmental allergies. Does not bruise/bleed easily.    Past medical history:  Past Medical History  Diagnosis Date  . Hep C w/o coma, chronic ([place])   . Hx of migraines   . Nexplanon removal 02/15/2015  . Contraceptive management 02/15/2015  . GERD without esophagitis     Past surgical history:  Past Surgical History  Procedure Laterality Date  . Cholecystectomy    . Cesarean section      Family history: Family History  Problem Relation Age of Onset  . Allergic rhinitis Son   .  Asthma Son     Social history: Social History   Social History  . Marital Status: Married    Spouse Name: N/A  . Number of Children: N/A  . Years of Education: N/A   Occupational History  . Not on file.   Social History Main Topics  . Smoking status: Never Smoker   . Smokeless tobacco: Never Used  . Alcohol Use: No  . Drug Use: No  . Sexual Activity: Yes   Other Topics Concern  . Not on file   Social History Narrative   Environmental History: The patient lives in a house with carpeting in the bedroom and central air/heat.  She is a nonsmoker without pets.    Medication List       This list is accurate as of: 01/29/16 12:40 PM.  Always use your most recent med list.               aspirin-acetaminophen-caffeine 250-250-65 MG tablet  Commonly known as:  EXCEDRIN MIGRAINE  Take by mouth every 6 (six) hours as needed for headache.     Azelastine-Fluticasone 137-50 MCG/ACT Susp  Commonly known as:  DYMISTA  Place 1 spray into both nostrils 2 (two) times daily.     budesonide-formoterol 160-4.5 MCG/ACT inhaler  Commonly known as:  SYMBICORT  Inhale 2 puffs into the lungs 2 (two) times daily. Use with spacer Rinse Gargle and Spit after use     fluticasone 50 MCG/ACT nasal spray  Commonly known  as:  FLONASE  Place 2 sprays into both nostrils daily.     IUD'S IU  by Intrauterine route.     levocetirizine 5 MG tablet  Commonly known as:  XYZAL  Take 1 tablet (5 mg total) by mouth every evening.     megestrol 40 MG tablet  Commonly known as:  MEGACE  3 tablets a day for 5 days, 2 tablets a day for 5 days then 1 tablet daily     montelukast 10 MG tablet  Commonly known as:  SINGULAIR  Take 10 mg by mouth at bedtime.     Olopatadine HCl 0.7 % Soln  Commonly known as:  PAZEO  Place 1 drop into both eyes daily.     omeprazole 40 MG capsule  Commonly known as:  PRILOSEC  Take 40 mg by mouth daily.     predniSONE 10 MG (21) Tbpk tablet  Commonly known as:   STERAPRED UNI-PAK 21 TAB  Take six pills the first day; then 5 pills the next day; then 4 pills the next day; then 3 pills the next day; then 2 pills the next day and the last day take one pill by mouth.     VENTOLIN HFA 108 (90 Base) MCG/ACT inhaler  Generic drug:  albuterol  Inhale 2 puffs into the lungs every 6 (six) hours as needed for wheezing or shortness of breath.        Known medication allergies: No Known Allergies  I appreciate the opportunity to take part in this Dasie's care. Please do not hesitate to contact me with questions.  Sincerely,   R. Edgar Frisk, MD

## 2016-01-29 NOTE — Assessment & Plan Note (Addendum)
   Aeroallergen avoidance measures have been discussed and provided in written form.  A prescription has been provided for levocetirizine, 5 mg daily as needed.  A prescription has been provided for Dymista (azelastine/fluticasone) nasal spray, 1 spray per nostril twice daily as needed. Proper nasal spray technique has been discussed and demonstrated.  I have also recommended nasal saline spray (i.e., Simply Saline) or nasal saline lavage (i.e., NeilMed) as needed prior to medicated nasal sprays.  The risks and benefits of aeroallergen immunotherapy have been discussed. The patient is motivated to initiate immunotherapy to reduce symptoms and decrease medication requirement. Informed consent has been signed and allergen vaccine orders have been submitted. Medications will be decreased or discontinued as symptom relief from immunotherapy becomes evident. 

## 2016-01-29 NOTE — Assessment & Plan Note (Addendum)
Extrinsic persistent asthma.  A prescription has been provided for Symbicort (budesonide/formoterol) 160/4.5 g, 2 inhalations twice a day.  To maximize pulmonary deposition, a spacer has been provided along with instructions for its proper administration with an HFA inhaler.  For now, continue montelukast 10 mg daily bedtime and albuterol HFA every 4-6 hours as needed.  To hasten symptom relief, prednisone has been provided, 20 mg x 4 days, 10 mg x1 day, then stop.  Subjective and objective measures of pulmonary function will be followed and the treatment plan will be adjusted accordingly.

## 2016-01-29 NOTE — Patient Instructions (Addendum)
Perennial and seasonal allergic rhinitis  Aeroallergen avoidance measures have been discussed and provided in written form.  A prescription has been provided for levocetirizine, 5 mg daily as needed.  A prescription has been provided for Dymista (azelastine/fluticasone) nasal spray, 1 spray per nostril twice daily as needed. Proper nasal spray technique has been discussed and demonstrated.  I have also recommended nasal saline spray (i.e., Simply Saline) or nasal saline lavage (i.e., NeilMed) as needed prior to medicated nasal sprays. The risks and benefits of aeroallergen immunotherapy have been discussed. The patient is motivated to initiate immunotherapy to reduce symptoms and decrease medication requirement. Informed consent has been signed and allergen vaccine orders have been submitted. Medications will be decreased or discontinued as symptom relief from immunotherapy becomes evident.  Seasonal allergic conjunctivitis  Treatment plan as outlined above for allergic rhinitis.  A prescription has been provided for Pazeo, one drop per eye daily as needed.  I have also recommended eye lubricant drops (i.e., Natural Tears) as needed.  Moderate persistent asthma with mild exacerbation Extrinsic persistent asthma.  A prescription has been provided for Symbicort (budesonide/formoterol) 160/4.5 g, 2 inhalations twice a day.  To maximize pulmonary deposition, a spacer has been provided along with instructions for its proper administration with an HFA inhaler.  For now, continue montelukast 10 mg daily bedtime and albuterol HFA every 4-6 hours as needed.  To hasten symptom relief, prednisone has been provided, 20 mg x 4 days, 10 mg x1 day, then stop.  Subjective and objective measures of pulmonary function will be followed and the treatment plan will be adjusted accordingly.    Return in about 6 weeks (around 03/11/2016), or if symptoms worsen or fail to improve.  Reducing Pollen  Exposure  The American Academy of Allergy, Asthma and Immunology suggests the following steps to reduce your exposure to pollen during allergy seasons.    1. Do not hang sheets or clothing out to dry; pollen may collect on these items. 2. Do not mow lawns or spend time around freshly cut grass; mowing stirs up pollen. 3. Keep windows closed at night.  Keep car windows closed while driving. 4. Minimize morning activities outdoors, a time when pollen counts are usually at their highest. 5. Stay indoors as much as possible when pollen counts or humidity is high and on windy days when pollen tends to remain in the air longer. 6. Use air conditioning when possible.  Many air conditioners have filters that trap the pollen spores. 7. Use a HEPA room air filter to remove pollen form the indoor air you breathe.   Control of Mold Allergen  Mold and fungi can grow on a variety of surfaces provided certain temperature and moisture conditions exist.  Outdoor molds grow on plants, decaying vegetation and soil.  The major outdoor mold, Alternaria and Cladosporium, are found in very high numbers during hot and dry conditions.  Generally, a late Summer - Fall peak is seen for common outdoor fungal spores.  Rain will temporarily lower outdoor mold spore count, but counts rise rapidly when the rainy period ends.  The most important indoor molds are Aspergillus and Penicillium.  Dark, humid and poorly ventilated basements are ideal sites for mold growth.  The next most common sites of mold growth are the bathroom and the kitchen.  Outdoor [employer] 1. Use air conditioning and keep windows closed 2. Avoid exposure to decaying vegetation. 3. Avoid leaf raking. 4. Avoid grain handling. 5. Consider wearing a face mask if  working in [employer] areas.  Indoor Mold Control 1. Maintain humidity below 50%. 2. Clean washable surfaces with 5% bleach solution. 3. Remove sources e.g. Contaminated carpets.

## 2016-01-30 DIAGNOSIS — J301 Allergic rhinitis due to pollen: Secondary | ICD-10-CM | POA: Diagnosis not present

## 2016-01-31 DIAGNOSIS — J3089 Other allergic rhinitis: Secondary | ICD-10-CM | POA: Diagnosis not present

## 2016-04-28 ENCOUNTER — Emergency Department (HOSPITAL_COMMUNITY)
Admission: EM | Admit: 2016-04-28 | Discharge: 2016-04-28 | Disposition: A | Discharge status: Home / Self Care | Payer: BLUE CROSS/BLUE SHIELD | Attending: Emergency Medicine | Admitting: Emergency Medicine

## 2016-04-28 ENCOUNTER — Emergency Department (HOSPITAL_COMMUNITY): Payer: BLUE CROSS/BLUE SHIELD

## 2016-04-28 ENCOUNTER — Encounter (HOSPITAL_COMMUNITY): Payer: Self-pay | Admitting: Emergency Medicine

## 2016-04-28 DIAGNOSIS — Z7982 Long term (current) use of aspirin: Secondary | ICD-10-CM | POA: Diagnosis not present

## 2016-04-28 DIAGNOSIS — Z79899 Other long term (current) drug therapy: Secondary | ICD-10-CM | POA: Diagnosis not present

## 2016-04-28 DIAGNOSIS — G43909 Migraine, unspecified, not intractable, without status migrainosus: Secondary | ICD-10-CM | POA: Insufficient documentation

## 2016-04-28 DIAGNOSIS — G43009 Migraine without aura, not intractable, without status migrainosus: Secondary | ICD-10-CM

## 2016-04-28 LAB — COMPREHENSIVE METABOLIC PANEL
ALT: 14 U/L (ref 14–54)
AST: 16 U/L (ref 15–41)
Albumin: 4.3 g/dL (ref 3.5–5.0)
Alkaline Phosphatase: 43 U/L (ref 38–126)
Anion gap: 3 — ABNORMAL LOW (ref 5–15)
BUN: 9 mg/dL (ref 6–20)
CO2: 28 mmol/L (ref 22–32)
Calcium: 9 mg/dL (ref 8.9–10.3)
Chloride: 106 mmol/L (ref 101–111)
Creatinine, Ser: 0.49 mg/dL (ref 0.44–1.00)
GFR calc Af Amer: >60 mL/min (ref >60)
GFR calc non Af Amer: >60 mL/min (ref >60)
Glucose, Bld: 80 mg/dL (ref 65–99)
Potassium: 4.1 mmol/L (ref 3.5–5.1)
Sodium: 137 mmol/L (ref 135–145)
Total Bilirubin: 1.1 mg/dL (ref 0.3–1.2)
Total Protein: 7.6 g/dL (ref 6.5–8.1)

## 2016-04-28 LAB — CBC WITH DIFFERENTIAL/PLATELET
Basophils Absolute: 0 10*3/uL (ref 0.0–0.1)
Basophils Relative: 0 %
Eosinophils Absolute: 0.1 10*3/uL (ref 0.0–0.7)
Eosinophils Relative: 2 %
HCT: 41.6 % (ref 36.0–46.0)
Hemoglobin: 13.6 g/dL (ref 12.0–15.0)
Lymphocytes Relative: 30 %
Lymphs Abs: 1.6 10*3/uL (ref 0.7–4.0)
MCH: 28.9 pg (ref 26.0–34.0)
MCHC: 32.7 g/dL (ref 30.0–36.0)
MCV: 88.3 fL (ref 78.0–100.0)
Monocytes Absolute: 0.3 10*3/uL (ref 0.1–1.0)
Monocytes Relative: 6 %
Neutro Abs: 3.3 10*3/uL (ref 1.7–7.7)
Neutrophils Relative %: 62 %
Platelets: 142 10*3/uL — ABNORMAL LOW (ref 150–400)
RBC: 4.71 MIL/uL (ref 3.87–5.11)
RDW: 13.2 % (ref 11.5–15.5)
WBC: 5.3 10*3/uL (ref 4.0–10.5)

## 2016-04-28 MED ORDER — SODIUM CHLORIDE 0.9 % IV BOLUS (SEPSIS)
500.0000 mL | Freq: Once | INTRAVENOUS | Status: AC
  Administered 2016-04-28: 500 mL via INTRAVENOUS

## 2016-04-28 MED ORDER — METOCLOPRAMIDE HCL 5 MG/ML IJ SOLN
10.0000 mg | Freq: Once | INTRAMUSCULAR | Status: AC
  Administered 2016-04-28: 10 mg via INTRAVENOUS
  Filled 2016-04-28: qty 2

## 2016-04-28 MED ORDER — DIPHENHYDRAMINE HCL 50 MG/ML IJ SOLN
25.0000 mg | Freq: Once | INTRAMUSCULAR | Status: AC
  Administered 2016-04-28: 25 mg via INTRAVENOUS
  Filled 2016-04-28: qty 1

## 2016-04-28 MED ORDER — TRAMADOL HCL 50 MG PO TABS: 50.0000 mg | ORAL_TABLET | Freq: Four times a day (QID) | ORAL | 0 refills | Status: DC | PRN

## 2016-04-28 NOTE — Discharge Instructions (Signed)
Follow-up with your family doctor if any problems 

## 2016-04-28 NOTE — ED Triage Notes (Signed)
PT stated hx of migraines and headache started x2 days ago with no injury. PT stated she went to Dr. Merlene Laughter this am and was given Toradol 30mg  injection and was told she would need a CT scan and to come to ED for eval. PT c/o nausea.

## 2016-04-28 NOTE — ED Provider Notes (Signed)
[facility] DEPT Provider Note   CSN: [id number] Arrival date & time: 04/28/16  1058  By signing my name below, I, Rayna Sexton, attest that this documentation has been prepared under the direction and in the presence of Milton Ferguson, MD. Electronically Signed: Rayna Sexton, ED Scribe. 04/28/16. 12:20 PM.   History   Chief Complaint Chief Complaint  Patient presents with  . Migraine    HPI HPI Comments: Amber Russell is a 45 y.o. female who presents to the Emergency Department complaining of a constant, moderate, HA x 2 days. She was seen by her neurologist this morning and received a toradol injection which provided mild relief of her pain. She reports similar symptoms years ago but denies ever having experienced a HA of this severity. Pt reports an associated febrile feeling (97.45F in triage) and nausea. She denies vomiting or other associated symptoms at this time.   Patient complaining of a headache. She was given Toradol in the office   The history is provided by the patient. No language interpreter was used.  Migraine  This is a recurrent problem. The current episode started 2 days ago. The problem occurs constantly. The problem has not changed since onset.Associated symptoms include headaches. Pertinent negatives include no chest pain and no abdominal pain. Nothing aggravates the symptoms. Relieved by: Some my Toradol. She has tried rest (toradol injection) for the symptoms. The treatment provided mild relief.    Past Medical History:  Diagnosis Date  . Contraceptive management 02/15/2015  . GERD without esophagitis   . Hep C w/o coma, chronic ([place])   . Hx of migraines   . Nexplanon removal 02/15/2015    Patient Active Problem List   Diagnosis Date Noted  . Perennial and seasonal allergic rhinitis 01/29/2016  . Seasonal allergic conjunctivitis 01/29/2016  . Moderate persistent asthma with mild exacerbation 01/29/2016  . Nexplanon removal 02/15/2015  .  Contraceptive management 02/15/2015  . DUB (dysfunctional uterine bleeding) 06/10/2013    Past Surgical History:  Procedure Laterality Date  . CESAREAN SECTION    . CHOLECYSTECTOMY      OB History    Gravida Para Term Preterm AB Living   3 3 3     3    SAB TAB Ectopic Multiple Live Births                   Home Medications    Prior to Admission medications   Medication Sig Start Date End Date Taking? Authorizing Provider  albuterol (VENTOLIN HFA) 108 (90 Base) MCG/ACT inhaler Inhale 2 puffs into the lungs every 6 (six) hours as needed for wheezing or shortness of breath.    Historical Provider, MD  aspirin-acetaminophen-caffeine (EXCEDRIN MIGRAINE) [phone] MG tablet Take by mouth every 6 (six) hours as needed for headache.    Historical Provider, MD  Azelastine-Fluticasone (DYMISTA) 137-50 MCG/ACT SUSP Place 1 spray into both nostrils 2 (two) times daily. 01/29/16   Adelina Mings, MD  budesonide-formoterol [facility]) 160-4.5 MCG/ACT inhaler Inhale 2 puffs into the lungs 2 (two) times daily. Use with spacer Rinse Gargle and Spit after use 01/29/16   Adelina Mings, MD  fluticasone [facility]) 50 MCG/ACT nasal spray Place 2 sprays into both nostrils daily.    Historical Provider, MD  IUD'S IU by Intrauterine route.    Historical Provider, MD  levocetirizine (XYZAL) 5 MG tablet Take 1 tablet (5 mg total) by mouth every evening. 01/29/16   Adelina Mings, MD  megestrol (MEGACE) 40 MG tablet 3  tablets a day for 5 days, 2 tablets a day for 5 days then 1 tablet daily 10/23/15   Florian Buff, MD  montelukast (SINGULAIR) 10 MG tablet Take 10 mg by mouth at bedtime.    Historical Provider, MD  Olopatadine HCl (PAZEO) 0.7 % SOLN Place 1 drop into both eyes daily. 01/29/16   Adelina Mings, MD  omeprazole (PRILOSEC) 40 MG capsule Take 40 mg by mouth daily.    Historical Provider, MD  predniSONE (STERAPRED UNI-PAK 21 TAB) 10 MG (21) TBPK tablet Take six pills the first day;  then 5 pills the next day; then 4 pills the next day; then 3 pills the next day; then 2 pills the next day and the last day take one pill by mouth. Patient not taking: Reported on 01/29/2016 12/26/15   Sanjuana Kava, MD    Family History Family History  Problem Relation Age of Onset  . Allergic rhinitis Son   . Asthma Son     Social History Social History  Substance Use Topics  . Smoking status: Never Smoker  . Smokeless tobacco: Never Used  . Alcohol use No     Allergies   Review of patient's allergies indicates no known allergies.   Review of Systems Review of Systems  Constitutional: Negative for appetite change and fatigue.  HENT: Negative for congestion, ear discharge and sinus pressure.   Eyes: Negative for discharge.  Respiratory: Negative for cough.   Cardiovascular: Negative for chest pain.  Gastrointestinal: Positive for nausea. Negative for abdominal pain, diarrhea and vomiting.  Genitourinary: Negative for frequency and hematuria.  Musculoskeletal: Negative for back pain.  Skin: Negative for rash.  Neurological: Positive for headaches. Negative for seizures.  Psychiatric/Behavioral: Negative for hallucinations.   Physical Exam Updated Vital Signs BP 134/61 (BP Location: Right Arm)   Pulse (!) 55   Temp 97.7 F (36.5 C) (Oral)   Resp 18   Ht 5\' 5"  (1.651 m)   Wt 162 lb (73.5 kg)   LMP 04/02/2016   SpO2 99%   BMI 26.96 kg/m   Physical Exam  Constitutional: She is oriented to person, place, and time. She appears well-developed.  HENT:  Head: Normocephalic.  Eyes: Conjunctivae and EOM are normal. No scleral icterus.  Neck: Neck supple. No thyromegaly present.  Cardiovascular: Normal rate and regular rhythm.  Exam reveals no gallop and no friction rub.   No murmur heard. Pulmonary/Chest: No stridor. She has no wheezes. She has no rales. She exhibits no tenderness.  Abdominal: She exhibits no distension. There is no tenderness. There is no rebound.    Musculoskeletal: Normal range of motion. She exhibits no edema.  Lymphadenopathy:    She has no cervical adenopathy.  Neurological: She is oriented to person, place, and time. She exhibits normal muscle tone. Coordination normal.  Skin: No rash noted. No erythema.  Psychiatric: She has a normal mood and affect. Her behavior is normal.   ED Treatments / Results  Labs (all labs ordered are listed, but only abnormal results are displayed) Labs Reviewed - No data to display  EKG  EKG Interpretation None       Radiology No results found.  Procedures Procedures  DIAGNOSTIC STUDIES: Oxygen Saturation is 99% on RA, normal by my interpretation.    COORDINATION OF CARE: 12:18 PM Discussed next steps with pt. Pt verbalized understanding and is agreeable with the plan.    Medications Ordered in ED Medications - No data to display   Initial  Impression / Assessment and Plan / ED Course  I have reviewed the triage vital signs and the nursing notes.  Pertinent labs & imaging results that were available during my care of the patient were reviewed by me and considered in my medical decision making (see chart for details).  Clinical Course   Migraine headache. Patient's headache was improved with Toradol and Reglan and Benadryl The chart was scribed for me under my direct supervision.  I personally performed the history, physical, and medical decision making and all procedures in the evaluation of this patient..  Final Clinical Impressions(s) / ED Diagnoses   Final diagnoses:  None    New Prescriptions New Prescriptions   No medications on file     Milton Ferguson, MD 04/28/16 1431

## 2016-05-20 ENCOUNTER — Ambulatory Visit: Payer: BLUE CROSS/BLUE SHIELD

## 2016-05-20 ENCOUNTER — Ambulatory Visit (INDEPENDENT_AMBULATORY_CARE_PROVIDER_SITE_OTHER): Payer: BLUE CROSS/BLUE SHIELD | Admitting: *Deleted

## 2016-05-20 DIAGNOSIS — J309 Allergic rhinitis, unspecified: Secondary | ICD-10-CM | POA: Diagnosis not present

## 2016-05-20 MED ORDER — EPINEPHRINE 0.3 MG/0.3ML IJ SOAJ
0.3000 mg | Freq: Once | INTRAMUSCULAR | 1 refills | Status: AC
Start: 2016-05-20 — End: 2016-05-20

## 2016-05-20 NOTE — Progress Notes (Signed)
Patient started Allergy Injections.  Patient had signed consent to start injections.  Emergency Action Plan was given to patient today and Epipen sent to pharmacy.  Patient was instructed on how to use Epipen.   Schedule A Once Weekly injection in [place] office. Patient given injection hours. Patient was given 0.05 cc each dose of 1-Grass-Weed-Tree and  2-Mold. Went over instructions on dosing and reactions with patient.  Patient waited 30 minutes after injections and no issues/reactions noted.

## 2016-05-27 ENCOUNTER — Ambulatory Visit (INDEPENDENT_AMBULATORY_CARE_PROVIDER_SITE_OTHER): Payer: BLUE CROSS/BLUE SHIELD | Admitting: *Deleted

## 2016-05-27 DIAGNOSIS — J309 Allergic rhinitis, unspecified: Secondary | ICD-10-CM | POA: Diagnosis not present

## 2016-06-03 ENCOUNTER — Ambulatory Visit (INDEPENDENT_AMBULATORY_CARE_PROVIDER_SITE_OTHER): Payer: BLUE CROSS/BLUE SHIELD | Admitting: *Deleted

## 2016-06-03 DIAGNOSIS — J309 Allergic rhinitis, unspecified: Secondary | ICD-10-CM | POA: Diagnosis not present

## 2016-06-17 ENCOUNTER — Ambulatory Visit (INDEPENDENT_AMBULATORY_CARE_PROVIDER_SITE_OTHER): Payer: BLUE CROSS/BLUE SHIELD | Admitting: *Deleted

## 2016-06-17 DIAGNOSIS — J309 Allergic rhinitis, unspecified: Secondary | ICD-10-CM

## 2016-06-24 ENCOUNTER — Ambulatory Visit (INDEPENDENT_AMBULATORY_CARE_PROVIDER_SITE_OTHER): Payer: BLUE CROSS/BLUE SHIELD | Admitting: *Deleted

## 2016-06-24 DIAGNOSIS — J309 Allergic rhinitis, unspecified: Secondary | ICD-10-CM

## 2016-07-01 ENCOUNTER — Ambulatory Visit (INDEPENDENT_AMBULATORY_CARE_PROVIDER_SITE_OTHER): Payer: BLUE CROSS/BLUE SHIELD | Admitting: *Deleted

## 2016-07-01 DIAGNOSIS — J309 Allergic rhinitis, unspecified: Secondary | ICD-10-CM | POA: Diagnosis not present

## 2016-07-08 ENCOUNTER — Ambulatory Visit (INDEPENDENT_AMBULATORY_CARE_PROVIDER_SITE_OTHER): Payer: BLUE CROSS/BLUE SHIELD

## 2016-07-08 DIAGNOSIS — J309 Allergic rhinitis, unspecified: Secondary | ICD-10-CM | POA: Diagnosis not present

## 2016-07-15 ENCOUNTER — Ambulatory Visit (INDEPENDENT_AMBULATORY_CARE_PROVIDER_SITE_OTHER): Payer: BLUE CROSS/BLUE SHIELD | Admitting: *Deleted

## 2016-07-15 DIAGNOSIS — J309 Allergic rhinitis, unspecified: Secondary | ICD-10-CM | POA: Diagnosis not present

## 2016-07-22 ENCOUNTER — Ambulatory Visit (INDEPENDENT_AMBULATORY_CARE_PROVIDER_SITE_OTHER): Payer: BLUE CROSS/BLUE SHIELD | Admitting: *Deleted

## 2016-07-22 DIAGNOSIS — J309 Allergic rhinitis, unspecified: Secondary | ICD-10-CM | POA: Diagnosis not present

## 2016-07-29 ENCOUNTER — Ambulatory Visit (INDEPENDENT_AMBULATORY_CARE_PROVIDER_SITE_OTHER): Payer: BLUE CROSS/BLUE SHIELD | Admitting: *Deleted

## 2016-07-29 DIAGNOSIS — J309 Allergic rhinitis, unspecified: Secondary | ICD-10-CM

## 2016-08-05 ENCOUNTER — Ambulatory Visit (INDEPENDENT_AMBULATORY_CARE_PROVIDER_SITE_OTHER): Payer: BLUE CROSS/BLUE SHIELD | Admitting: *Deleted

## 2016-08-05 DIAGNOSIS — J309 Allergic rhinitis, unspecified: Secondary | ICD-10-CM | POA: Diagnosis not present

## 2016-08-12 ENCOUNTER — Ambulatory Visit (INDEPENDENT_AMBULATORY_CARE_PROVIDER_SITE_OTHER): Payer: BLUE CROSS/BLUE SHIELD | Admitting: *Deleted

## 2016-08-12 DIAGNOSIS — J309 Allergic rhinitis, unspecified: Secondary | ICD-10-CM | POA: Diagnosis not present

## 2016-08-19 ENCOUNTER — Ambulatory Visit (INDEPENDENT_AMBULATORY_CARE_PROVIDER_SITE_OTHER): Payer: BLUE CROSS/BLUE SHIELD | Admitting: *Deleted

## 2016-08-19 DIAGNOSIS — J309 Allergic rhinitis, unspecified: Secondary | ICD-10-CM | POA: Diagnosis not present

## 2016-08-26 ENCOUNTER — Ambulatory Visit (INDEPENDENT_AMBULATORY_CARE_PROVIDER_SITE_OTHER): Payer: BLUE CROSS/BLUE SHIELD | Admitting: *Deleted

## 2016-08-26 DIAGNOSIS — J309 Allergic rhinitis, unspecified: Secondary | ICD-10-CM | POA: Diagnosis not present

## 2016-09-02 ENCOUNTER — Ambulatory Visit (INDEPENDENT_AMBULATORY_CARE_PROVIDER_SITE_OTHER): Payer: BLUE CROSS/BLUE SHIELD | Admitting: *Deleted

## 2016-09-02 DIAGNOSIS — J309 Allergic rhinitis, unspecified: Secondary | ICD-10-CM | POA: Diagnosis not present

## 2016-09-09 ENCOUNTER — Other Ambulatory Visit (HOSPITAL_COMMUNITY): Payer: Self-pay | Admitting: Internal Medicine

## 2016-09-09 ENCOUNTER — Ambulatory Visit (HOSPITAL_COMMUNITY)
Admission: RE | Admit: 2016-09-09 | Discharge: 2016-09-09 | Disposition: A | Discharge status: Home / Self Care | Payer: BLUE CROSS/BLUE SHIELD | Source: Ambulatory Visit | Attending: Internal Medicine | Admitting: Internal Medicine

## 2016-09-09 DIAGNOSIS — R0989 Other specified symptoms and signs involving the circulatory and respiratory systems: Secondary | ICD-10-CM | POA: Diagnosis not present

## 2016-09-16 ENCOUNTER — Ambulatory Visit (INDEPENDENT_AMBULATORY_CARE_PROVIDER_SITE_OTHER): Payer: BLUE CROSS/BLUE SHIELD | Admitting: *Deleted

## 2016-09-16 DIAGNOSIS — J309 Allergic rhinitis, unspecified: Secondary | ICD-10-CM | POA: Diagnosis not present

## 2016-09-23 ENCOUNTER — Ambulatory Visit: Payer: BLUE CROSS/BLUE SHIELD | Admitting: Allergy and Immunology

## 2016-09-23 ENCOUNTER — Ambulatory Visit (INDEPENDENT_AMBULATORY_CARE_PROVIDER_SITE_OTHER): Payer: BLUE CROSS/BLUE SHIELD | Admitting: *Deleted

## 2016-09-23 DIAGNOSIS — J309 Allergic rhinitis, unspecified: Secondary | ICD-10-CM | POA: Diagnosis not present

## 2016-09-24 NOTE — Addendum Note (Signed)
Addended by: Katherina Right D on: 09/24/2016 11:21 AM   Modules accepted: Orders

## 2016-09-30 ENCOUNTER — Ambulatory Visit (INDEPENDENT_AMBULATORY_CARE_PROVIDER_SITE_OTHER): Payer: BLUE CROSS/BLUE SHIELD | Admitting: *Deleted

## 2016-09-30 DIAGNOSIS — J309 Allergic rhinitis, unspecified: Secondary | ICD-10-CM | POA: Diagnosis not present

## 2016-10-07 ENCOUNTER — Encounter: Payer: Self-pay | Admitting: Allergy & Immunology

## 2016-10-07 ENCOUNTER — Ambulatory Visit (INDEPENDENT_AMBULATORY_CARE_PROVIDER_SITE_OTHER): Payer: BLUE CROSS/BLUE SHIELD | Admitting: *Deleted

## 2016-10-07 ENCOUNTER — Ambulatory Visit (INDEPENDENT_AMBULATORY_CARE_PROVIDER_SITE_OTHER): Payer: BLUE CROSS/BLUE SHIELD | Admitting: Allergy & Immunology

## 2016-10-07 VITALS — BP 122/70 | HR 62 | Temp 98.3°F | Resp 18 | Ht 64.57 in | Wt 171.6 lb

## 2016-10-07 DIAGNOSIS — J4540 Moderate persistent asthma, uncomplicated: Secondary | ICD-10-CM | POA: Diagnosis not present

## 2016-10-07 DIAGNOSIS — J3089 Other allergic rhinitis: Secondary | ICD-10-CM

## 2016-10-07 DIAGNOSIS — J309 Allergic rhinitis, unspecified: Secondary | ICD-10-CM

## 2016-10-07 MED ORDER — DEXLANSOPRAZOLE 30 MG PO CPDR: 30.0000 mg | DELAYED_RELEASE_CAPSULE | Freq: Every day | ORAL | 5 refills | Status: DC

## 2016-10-07 NOTE — Patient Instructions (Addendum)
1. Moderate persistent asthma, uncomplicated - Lung function looked normal today. - Continue with the same regimen for now.  - We will defer medication changes to Dr. Luan Pulling since you are seeing him for your lungs.  - Daily controller medication(s): Symbicort 160/4.5 two puffs twice daily via spacer - Rescue medications: ProAir 4 puffs every 4-6 hours as needed - Asthma control goals:  * Full participation in all desired activities (may need albuterol before activity) * Albuterol use two time or less a week on average (not counting use with activity) * Cough interfering with sleep two time or less a month * Oral steroids no more than once a year * No hospitalizations  2. Perennial allergic rhinitis - Continue with allergy shots at the current schedule. - We can changing you to a more aggressive schedule to see how you do. - Continue with Dymista 1-2 sprays per nostril daily + Xyzal 5mg  daily.  3. Gastroesophageal reflux  - Continue with Dexilant 30mg  daily.    4. Return in about 6 months (around 04/06/2017).  Please inform us of any Emergency Department visits, hospitalizations, or changes in symptoms. Call us before going to the ED for breathing or allergy symptoms since we might be able to fit you in for a sick visit. Feel free to contact us anytime with any questions, problems, or concerns.  It was a pleasure to meet you today! Best wishes in the [place] Year!   Websites that have reliable patient information: 1. American Academy of Asthma, Allergy, and Immunology: www.aaaai.org 2. Food Allergy Research and Education (FARE): foodallergy.org 3. Mothers of Asthmatics: http://www.asthmacommunitynetwork.org 4. American College of Allergy, Asthma, and Immunology: www.acaai.org

## 2016-10-07 NOTE — Progress Notes (Signed)
FOLLOW UP  Date of Service/Encounter:  10/07/16   Assessment:   Moderate persistent asthma, uncomplicated  Perennial allergic rhinitis  GERD   Asthma Reportables:  Severity: moderate persistent  Risk: low Control: well controlled  Seasonal Influenza Vaccine: yes    Plan/Recommendations:   1. Moderate persistent asthma, uncomplicated - Lung function looked normal today. - Continue with the same regimen for now.  - We will obtain outside records from Dr. Luan Pulling office. - Release of Information forms signed today.  - We will defer medication changes to Dr. Luan Pulling since you are seeing him for your lungs.  - Daily controller medication(s): Symbicort 160/4.5 two puffs twice daily via spacer - Rescue medications: ProAir 4 puffs every 4-6 hours as needed - Asthma control goals:  * Full participation in all desired activities (may need albuterol before activity) * Albuterol use two time or less a week on average (not counting use with activity) * Cough interfering with sleep two time or less a month * Oral steroids no more than once a year * No hospitalizations  2. Perennial allergic rhinitis - Continue with allergy shots at the current schedule. - We will change her to Schedule B since she would like to reach maintenance at a quicker rate.  - If she does not tolerate this, we will change her back to Schedule A.  - Continue with Dymista 1-2 sprays per nostril daily + Xyzal 5mg  daily.  3. Gastroesophageal reflux  - Continue with Dexilant 30mg  daily.   - Unsure whether GERD is contributing to her worsening asthma/cough. Amber Russell is normal today which makes me think that her cough is unrelated to asthma.   4. Return in about 6 months (around 04/06/2017).   Subjective:   Amber Russell is a 46 y.o. female presenting today for follow up of  Chief Complaint  Patient presents with  . Follow-up    sick  . Cough    since thanksgiving, has had breathing treatments,  and predisone, on anitbotics now    Amber Russell has a history of the following: Patient Active Problem List   Diagnosis Date Noted  . Perennial and seasonal allergic rhinitis 01/29/2016  . Seasonal allergic conjunctivitis 01/29/2016  . Moderate persistent asthma with mild exacerbation 01/29/2016  . Nexplanon removal 02/15/2015  . Contraceptive management 02/15/2015  . DUB (dysfunctional uterine bleeding) 06/10/2013    History obtained from: chart review and patient. The history is rather difficult to obtain secondary to a language barrier and poor historian.  Amber Russell was referred by [facility], MD.     Amber Russell is a 46 y.o. female presenting for a follow up visit. She was last seen in May 2017 by Dr. Verlin Fester. That was her first appointment. At that time, she had testing that was positive to weeds, ragweed, tree, grass, and molds. She is on immunotherapy. She receives 2 vials: #1 (grass, weeds, and trees) on 0.72mL of the Gold (1:10,000) and #2 (molds) on 0.54mL of the Gold (1:10,000). She was started on time this as well as Xyzal 5 mg daily. For her asthma, she was started on Symbicort 160/4.5 2 puffs in the morning and 2 puffs at night. She was also given a prednisone burst.  Since last visit, she has not done well. She does have a chronic cough since November that has been treated with antibiotics (completed a course of azithromycin as well as another one and is currently completing a course of Augmentin). She has a CXR that was  normal in December 2017. She was referred to Dr. Luan Pulling and her inhalers were changed although it is not clear what was changed from a conversation with the patient. She is going to see him again February 19th.      She was also started on a methylprednisolone dose pack without improvement. She does endorse some heart burn and she was on a medicine at some point but did not like it. This was a couple of months ago. She does endorse smyptoms of heart  burn and takes a medication currently although she cannot remember the name. She has a history of hepatitis C but has undergone treatment resulting in a complete cure at this point. She follows up annually with a gastroenterologist.   Otherwise, there have been no changes to her past medical history, surgical history, family history, or social history.    Review of Systems: a 14-point review of systems is pertinent for what is mentioned in HPI.  Otherwise, all other systems were negative. Constitutional: negative other than that listed in the HPI Eyes: negative other than that listed in the HPI Ears, nose, mouth, throat, and face: negative other than that listed in the HPI Respiratory: negative other than that listed in the HPI Cardiovascular: negative other than that listed in the HPI Gastrointestinal: negative other than that listed in the HPI Genitourinary: negative other than that listed in the HPI Integument: negative other than that listed in the HPI Hematologic: negative other than that listed in the HPI Musculoskeletal: negative other than that listed in the HPI Neurological: negative other than that listed in the HPI Allergy/Immunologic: negative other than that listed in the HPI    Objective:   Blood pressure 122/70, pulse 62, temperature 98.3 F (36.8 C), temperature source Oral, resp. rate 18, height 5' 4.57" (1.64 m), weight 171 lb 9.6 oz (77.8 kg), SpO2 97 %. Body mass index is 28.94 kg/m.   Physical Exam:  General: Alert, interactive, in no acute distress. Cooperative with the exam. Smiling and friendly.  Eyes: No conjunctival injection present on the right, No conjunctival injection present on the left, PERRL bilaterally, No discharge on the right, No discharge on the left and No Horner-Trantas dots present Ears: Right TM pearly gray with normal light reflex, Left TM pearly gray with normal light reflex, Right TM intact without perforation and Left TM intact without  perforation.  Nose/Throat: External nose within normal limits and septum midline, turbinates edematous and pale with clear discharge, post-pharynx mildly erythematous with cobblestoning in the posterior oropharynx. Tonsils 2+ without exudates Neck: Supple without thyromegaly. Lungs: Clear to auscultation without wheezing, rhonchi or rales. No increased work of breathing. CV: Normal S1/S2, no murmurs. Capillary refill <2 seconds.  Skin: Warm and dry, without lesions or rashes. Neuro:   Grossly intact. No focal deficits appreciated. Responsive to questions.   Diagnostic studies:  Spirometry: results normal (FEV1: 2.49/89%, FVC: 3.00/92%, FEV1/FVC: 83%).    Spirometry consistent with normal pattern.   Allergy Studies: None     Salvatore Marvel, MD [facility]

## 2016-10-14 ENCOUNTER — Ambulatory Visit (INDEPENDENT_AMBULATORY_CARE_PROVIDER_SITE_OTHER): Payer: BLUE CROSS/BLUE SHIELD | Admitting: *Deleted

## 2016-10-14 DIAGNOSIS — J309 Allergic rhinitis, unspecified: Secondary | ICD-10-CM

## 2016-10-21 ENCOUNTER — Ambulatory Visit (INDEPENDENT_AMBULATORY_CARE_PROVIDER_SITE_OTHER): Payer: BLUE CROSS/BLUE SHIELD | Admitting: *Deleted

## 2016-10-21 DIAGNOSIS — J309 Allergic rhinitis, unspecified: Secondary | ICD-10-CM | POA: Diagnosis not present

## 2016-10-28 ENCOUNTER — Ambulatory Visit (INDEPENDENT_AMBULATORY_CARE_PROVIDER_SITE_OTHER): Payer: BLUE CROSS/BLUE SHIELD | Admitting: *Deleted

## 2016-10-28 DIAGNOSIS — J309 Allergic rhinitis, unspecified: Secondary | ICD-10-CM | POA: Diagnosis not present

## 2016-11-04 ENCOUNTER — Ambulatory Visit (INDEPENDENT_AMBULATORY_CARE_PROVIDER_SITE_OTHER): Payer: BLUE CROSS/BLUE SHIELD | Admitting: *Deleted

## 2016-11-04 DIAGNOSIS — J309 Allergic rhinitis, unspecified: Secondary | ICD-10-CM | POA: Diagnosis not present

## 2016-11-17 ENCOUNTER — Encounter (HOSPITAL_COMMUNITY): Payer: Self-pay

## 2016-11-17 ENCOUNTER — Ambulatory Visit (HOSPITAL_COMMUNITY): Payer: BLUE CROSS/BLUE SHIELD | Attending: Neurology

## 2016-11-17 DIAGNOSIS — G43709 Chronic migraine without aura, not intractable, without status migrainosus: Secondary | ICD-10-CM | POA: Diagnosis not present

## 2016-11-17 DIAGNOSIS — G43809 Other migraine, not intractable, without status migrainosus: Secondary | ICD-10-CM | POA: Insufficient documentation

## 2016-11-17 DIAGNOSIS — IMO0002 Reserved for concepts with insufficient information to code with codable children: Secondary | ICD-10-CM

## 2016-11-17 DIAGNOSIS — M436 Torticollis: Secondary | ICD-10-CM | POA: Insufficient documentation

## 2016-11-17 NOTE — Patient Instructions (Signed)
  UPPER TRAP STRETCH - HOLDING CHAIR AND HEAD  While sitting in a chair, hold the seat with one hand and place your other hand on your head to assist in bending your head to the side as shown.   Bend your head towards the opposite side of the hand that is holding the chair seat. You should feel a stretch to the side of your neck.  If this makes your pain worse, stop doing it.  Perform 2x/day, 3 x 30 seconds    CERVICAL TOWEL ROTATION STRETCH  Hold the ends of a small folded bath towel and wrap it around your head and neck as shown. Place the towel on your face so as to minimize placing pressure on your jaw. Pressure should be placed on the side of your face/cheek bone.   Use your bottom most arm to anchor the towel in place. Use your top most arm to pull the towel to cause a gentle rotational stretch in your neck. Hold, then return to starting position and repeat.   Perform 1x/day, 2 x 10 repetitions

## 2016-11-17 NOTE — Therapy (Signed)
[place], [place], [address] Phone: [phone]   Fax:  [phone]  Physical Therapy Evaluation  Patient Details  Name: Amber Russell MRN: [id number] Date of Birth: Jul 30, 1971 Referring Provider: Barton Fanny, NP  Encounter Date: 11/17/2016      PT End of Session - 11/17/16 1423    Visit Number 1   Number of Visits 13   Date for PT Re-Evaluation 12/08/16   Authorization Type BCBS Other   Authorization Time Period 30 visits for benefit period   PT Start Time 1035   PT Stop Time 1120   PT Time Calculation (min) 45 min   Activity Tolerance Patient tolerated treatment well;No increased pain   Behavior During Therapy WFL for tasks assessed/performed      Past Medical History:  Diagnosis Date  . Contraceptive management 02/15/2015  . GERD without esophagitis   . Hep C w/o coma, chronic ([place])   . Hx of migraines   . Nexplanon removal 02/15/2015    Past Surgical History:  Procedure Laterality Date  . CESAREAN SECTION    . CHOLECYSTECTOMY      There were no vitals filed for this visit.       Subjective Assessment - 11/17/16 1038    Subjective Pt reports having chronic migraines for 10 years, maybe more. She states that they began insidiously. She denies being in MVA or concussion. Sometimes her migraines are so bad that she has to go to the hospital (last time being about 4 years ago). She reports having a migraine anywhere from 3-7 days/week and that they have gotten progressively worse over the last few months. She reports having photophobia and inability to drive or move when she is having a migraine. If the migraine is bad enough, she gets has n/v and dizziness. She reports the pain is typically located at bil temporals and intermittently at the back of her head, reporting it feels like it is a deep burning sensation. She does report intermittent n/t in BUE, R > L. Her sleep has been disturbed due to the  pain and has to take a muscle relaxer to help her sleep.   Pertinent History Hep C, GERD, asthma   Limitations Sitting;House hold activities  work duties   How long can you sit comfortably? 5-10 mins before having to readjust   How long can you stand comfortably? no issues   How long can you walk comfortably? no issues   Patient Stated Goals to have less pain, improve her overall QOL   Currently in Pain? Yes   Pain Score 6    Pain Location Shoulder  lower R neck as well   Pain Orientation Right   Pain Descriptors / Indicators Aching;Burning;Dull   Pain Type Chronic pain   Pain Onset More than a month ago   Pain Frequency Intermittent   Aggravating Factors  laying down, moving it, R cervical rotation   Pain Relieving Factors medicine, sitting down   Effect of Pain on Daily Activities difficulty working and performing household chores   Multiple Pain Sites No            [facility] PT Assessment - 11/17/16 0001      Assessment   Medical Diagnosis chronic migraine   Referring Provider Barton Fanny, NP   Onset Date/Surgical Date 11/18/06   Next MD Visit 02/01/2017   Prior Therapy not for present issue     Precautions   Precautions None  Restrictions   Weight Bearing Restrictions No     Balance Screen   Has the patient fallen in the past 6 months No   Has the patient had a decrease in activity level because of a fear of falling?  No   Is the patient reluctant to leave their home because of a fear of falling?  No     Prior Function   Level of Independence Independent;Independent with basic ADLs   Vocation Part time employment     Posture/Postural Control   Posture Comments increased thoracic kyphosis, forward head posture in sitting     ROM / Strength   AROM / PROM / Strength AROM;Strength     AROM   Cervical Flexion 36  pain in R shoulder   Cervical Extension 31  pain in R shoulder   Cervical - Right Side Bend 26  pain in R shoudler   Cervical - Left Side Bend  30   Cervical - Right Rotation 27  pain in R shoulder   Cervical - Left Rotation 81     Strength   Right Shoulder Flexion 4+/5   Right Shoulder ABduction 4+/5   Left Shoulder Flexion 4+/5   Left Shoulder ABduction 4+/5   Right Elbow Flexion 4+/5   Right Elbow Extension 4/5   Left Elbow Flexion 4+/5   Left Elbow Extension 4/5     Flexibility   Soft Tissue Assessment /Muscle Length no     Palpation   Palpation comment Increased soft tissue restrictions and TTP of UT, periscapular musculature, cervical paraspinals, suboccipitals bil, R>L          [facility] Adult PT Treatment/Exercise - 11/17/16 0001      Manual Therapy   Manual Therapy Manual Traction   Manual therapy comments Manual complete separate rest of tx   Manual Traction cervical traction in supine x 5 mins; pt reported feeilng a little better following          PT Education - 11/17/16 1620    Education provided Yes   Education Details exam findings, POC, HEP   Person(s) Educated Patient   Methods Explanation;Demonstration;Handout   Comprehension Verbalized understanding;Returned demonstration          PT Short Term Goals - 11/17/16 1600      PT SHORT TERM GOAL #1   Title Pt will be independent with HEP and perform correctly and consistenly to decrease pain and maximize return to PLOF .   Time 3   Period Weeks   Status New     PT SHORT TERM GOAL #2   Title Pt will be able to attain and maintain proper sitting posture 75% of the time or greater in order to help reduce symptoms and maximize return to PLOF.   Time 3   Period Weeks   Status New     PT SHORT TERM GOAL #3   Title Pt will be able to drive for 1 hour or greater with 3/10 pain or less to maximize participation in the community and demonstrate improved overall functin.   Time 3   Period Weeks   Status New           PT Long Term Goals - 11/17/16 1605      PT LONG TERM GOAL #1   Title Pt will be able to sleep through the night with 2  awakenings or less due to pain to maximize function and promote recovery to PLOF.   Time 6   Period  Weeks   Status New     PT LONG TERM GOAL #2   Title Pt will have improved BUE strength to 5/5 to demonstrate improved overall function and maximize her ability to perform house and work duties.   Time 6   Period Weeks   Status New     PT LONG TERM GOAL #3   Title Pt will report decreased frequency of migraines to 2 a week or less to facilitate improved participation at home and in the community and demonstrate improved overall function.   Time 6   Period Weeks   Status New     PT LONG TERM GOAL #4   Title Pt will have improved cervical AROM with minimal to no pain in either UE to demonstrate improved overall function and allow pt to return to the gym.   Time 6   Period Weeks   Status New               Plan - 11/17/16 1549    Clinical Impression Statement Pt is pleasant 46 YO F who presents to therapy with c/o chronic migraines. Pt currently not having migrain pain, but did present with pain in her R UT/lower cervical region. She had deficits in cervical ROM with pain in R UT region with flexion/ext/R rotation/R side bend. Pt also presented with increased soft tissue restrictions and TTP of bil UT, periscapular musculature, cervical paraspinals, and suboccipitals (R > L) throughout, all recreating her pain and radiating into her head. Pt also had mild deficits in BUE strength as well as poor sitting posture with foward head. PT performed manual cervical distraction during session and pt reported feeling a little better following. Pt needs skilled PT intervention to maximize function, decrease pain, and improve QoL.   Rehab Potential Good   Clinical Impairments Affecting Rehab Potential (+): young, motivated; (-): chronicity of issue   PT Frequency 2x / week   PT Duration 6 weeks   PT Treatment/Interventions ADLs/Self Care Home Management;Cryotherapy;Electrical Stimulation;Moist  Heat;Therapeutic activities;Therapeutic exercise;Patient/family education;Manual techniques;Passive range of motion;Dry needling;Neuromuscular re-education   PT Next Visit Plan assess cervical spine mobility, STM to cervical paraspinals/UT/periscapular musculature, postural strengthening   PT Home Exercise Plan eval: seated UT stretch with mild OP, R cervical rotation AAROM with towel   Consulted and Agree with Plan of Care Patient      Patient will benefit from skilled therapeutic intervention in order to improve the following deficits and impairments:  Decreased mobility, Decreased range of motion, Decreased strength, Hypomobility, Increased muscle spasms, Impaired flexibility, Improper body mechanics, Pain  Visit Diagnosis: Chronic migraine - Plan: PT plan of care cert/re-cert  Stiffness of neck - Plan: PT plan of care cert/re-cert  Cervicogenic migraine - Plan: PT plan of care cert/re-cert     Problem List Patient Active Problem List   Diagnosis Date Noted  . Perennial and seasonal allergic rhinitis 01/29/2016  . Seasonal allergic conjunctivitis 01/29/2016  . Moderate persistent asthma with mild exacerbation 01/29/2016  . Nexplanon removal 02/15/2015  . Contraceptive management 02/15/2015  . DUB (dysfunctional uterine bleeding) 06/10/2013     Geraldine Solar PT, DPT   [facility] [address] [place], [place], [address] Phone: [phone]   Fax:  [phone]  Name: Amber Russell MRN: [id number] Date of Birth: [birth year]

## 2016-11-18 ENCOUNTER — Ambulatory Visit (INDEPENDENT_AMBULATORY_CARE_PROVIDER_SITE_OTHER): Payer: BLUE CROSS/BLUE SHIELD | Admitting: *Deleted

## 2016-11-18 DIAGNOSIS — J309 Allergic rhinitis, unspecified: Secondary | ICD-10-CM | POA: Diagnosis not present

## 2016-11-25 ENCOUNTER — Ambulatory Visit (HOSPITAL_COMMUNITY): Payer: BLUE CROSS/BLUE SHIELD | Admitting: Physical Therapy

## 2016-11-25 ENCOUNTER — Ambulatory Visit (INDEPENDENT_AMBULATORY_CARE_PROVIDER_SITE_OTHER): Payer: BLUE CROSS/BLUE SHIELD | Admitting: *Deleted

## 2016-11-25 DIAGNOSIS — M436 Torticollis: Secondary | ICD-10-CM

## 2016-11-25 DIAGNOSIS — J309 Allergic rhinitis, unspecified: Secondary | ICD-10-CM

## 2016-11-25 DIAGNOSIS — IMO0002 Reserved for concepts with insufficient information to code with codable children: Secondary | ICD-10-CM

## 2016-11-25 DIAGNOSIS — G43709 Chronic migraine without aura, not intractable, without status migrainosus: Secondary | ICD-10-CM

## 2016-11-25 DIAGNOSIS — G43809 Other migraine, not intractable, without status migrainosus: Secondary | ICD-10-CM

## 2016-11-25 NOTE — Therapy (Signed)
[place], [place], [address] Phone: [phone]   Fax:  [phone]  Physical Therapy Treatment  Patient Details  Name: Amber Russell MRN: [id number] Date of Birth: 06-06-1971 Referring Provider: Barton Fanny, NP  Encounter Date: 11/25/2016      PT End of Session - 11/25/16 1218    Visit Number 2   Number of Visits 13   Date for PT Re-Evaluation 12/08/16   Authorization Type BCBS Other   Authorization Time Period 30 visits for benefit period   PT Start Time 1130   PT Stop Time 1209   PT Time Calculation (min) 39 min   Activity Tolerance Patient tolerated treatment well;No increased pain   Behavior During Therapy WFL for tasks assessed/performed      Past Medical History:  Diagnosis Date  . Contraceptive management 02/15/2015  . GERD without esophagitis   . Hep C w/o coma, chronic ([place])   . Hx of migraines   . Nexplanon removal 02/15/2015    Past Surgical History:  Procedure Laterality Date  . CESAREAN SECTION    . CHOLECYSTECTOMY      There were no vitals filed for this visit.      Subjective Assessment - 11/25/16 1212    Subjective Pt states that she just got two allergy shots and is sore in her arms.  Her headache is at a 3 today    Pertinent History Hep C, GERD, asthma   Limitations Sitting;House hold activities  work duties   How long can you sit comfortably? 5-10 mins before having to readjust   How long can you stand comfortably? no issues   How long can you walk comfortably? no issues   Patient Stated Goals to have less pain, improve her overall QOL   Currently in Pain? Yes   Pain Score 3    Pain Location Head   Pain Orientation Right   Pain Descriptors / Indicators Aching   Pain Onset More than a month ago   Pain Frequency Constant   Aggravating Factors  unsure   Pain Relieving Factors unsure                          [facility] Adult PT Treatment/Exercise -  11/25/16 0001      Exercises   Exercises Neck     Neck Exercises: Standing   Other Standing Exercises scapular retraction x 10     Neck Exercises: Supine   Cervical Isometrics Extension;Right lateral flexion;Left lateral flexion;3 secs;5 secs   Neck Retraction 5 reps   Shoulder ABduction --  PROM     Manual Therapy   Manual Therapy Soft tissue mobilization;Myofascial release;Manual Traction;Neural Stretch   Manual therapy comments Manual complete separate rest of tx   Soft tissue mobilization B trapezius   Myofascial Release cranial sacral techniques   Manual Traction cervical traction in supine x 5 mins; pt reported feeilng a little better following                  PT Short Term Goals - 11/25/16 1220      PT SHORT TERM GOAL #1   Title Pt will be independent with HEP and perform correctly and consistenly to decrease pain and maximize return to PLOF .   Time 3   Period Weeks   Status On-going     PT SHORT TERM GOAL #2   Title Pt will be able to attain  and maintain proper sitting posture 75% of the time or greater in order to help reduce symptoms and maximize return to PLOF.   Time 3   Period Weeks   Status On-going     PT SHORT TERM GOAL #3   Title Pt will be able to drive for 1 hour or greater with 3/10 pain or less to maximize participation in the community and demonstrate improved overall functin.   Time 3   Period Weeks   Status On-going           PT Long Term Goals - 11/25/16 1221      PT LONG TERM GOAL #1   Title Pt will be able to sleep through the night with 2 awakenings or less due to pain to maximize function and promote recovery to PLOF.   Time 6   Period Weeks   Status On-going     PT LONG TERM GOAL #2   Title Pt will have improved BUE strength to 5/5 to demonstrate improved overall function and maximize her ability to perform house and work duties.   Time 6   Period Weeks   Status On-going     PT LONG TERM GOAL #3   Title Pt will  report decreased frequency of migraines to 2 a week or less to facilitate improved participation at home and in the community and demonstrate improved overall function.   Time 6   Period Weeks   Status On-going     PT LONG TERM GOAL #4   Title Pt will have improved cervical AROM with minimal to no pain in either UE to demonstrate improved overall function and allow pt to return to the gym.   Time 6   Period Weeks   Status On-going               Plan - 11/25/16 1218    Clinical Impression Statement Pt states headache absolved at the end of treatment.  Pt responded very well to manual traction and cranialsacral techniques.  Pt has functional cervical/ shoulder ROM.  Evaluation and goals reviewed with pt.    Rehab Potential Good   Clinical Impairments Affecting Rehab Potential (+): young, motivated; (-): chronicity of issue   PT Frequency 2x / week   PT Duration 6 weeks   PT Treatment/Interventions ADLs/Self Care Home Management;Cryotherapy;Electrical Stimulation;Moist Heat;Therapeutic activities;Therapeutic exercise;Patient/family education;Manual techniques;Passive range of motion;Dry needling;Neuromuscular re-education   PT Next Visit Plan , postural strengthening; give cervical and scapular retraction for HEP, begin w-back    PT Home Exercise Plan eval: seated UT stretch with mild OP, R cervical rotation AAROM with towel   Consulted and Agree with Plan of Care Patient      Patient will benefit from skilled therapeutic intervention in order to improve the following deficits and impairments:  Decreased mobility, Decreased range of motion, Decreased strength, Hypomobility, Increased muscle spasms, Impaired flexibility, Improper body mechanics, Pain  Visit Diagnosis: Chronic migraine  Stiffness of neck  Cervicogenic migraine     Problem List Patient Active Problem List   Diagnosis Date Noted  . Perennial and seasonal allergic rhinitis 01/29/2016  . Seasonal allergic  conjunctivitis 01/29/2016  . Moderate persistent asthma with mild exacerbation 01/29/2016  . Nexplanon removal 02/15/2015  . Contraceptive management 02/15/2015  . DUB (dysfunctional uterine bleeding) 06/10/2013   Rayetta Humphrey, PT CLT [phone] 11/25/2016, 12:22 PM  [facility] [address] [place], [place], [address] Phone: [phone]   Fax:  [phone]  Name: Ivette Castronova MRN: [id number] Date of Birth: Nov 19, 1970

## 2016-11-27 ENCOUNTER — Ambulatory Visit (HOSPITAL_COMMUNITY): Payer: BLUE CROSS/BLUE SHIELD

## 2016-11-27 DIAGNOSIS — G43709 Chronic migraine without aura, not intractable, without status migrainosus: Secondary | ICD-10-CM | POA: Diagnosis not present

## 2016-11-27 DIAGNOSIS — IMO0002 Reserved for concepts with insufficient information to code with codable children: Secondary | ICD-10-CM

## 2016-11-27 DIAGNOSIS — G43809 Other migraine, not intractable, without status migrainosus: Secondary | ICD-10-CM

## 2016-11-27 DIAGNOSIS — M436 Torticollis: Secondary | ICD-10-CM

## 2016-11-27 NOTE — Therapy (Signed)
[place], [place], [address] Phone: [phone]   Fax:  [phone]  Physical Therapy Treatment  Patient Details  Name: Amber Russell MRN: [id number] Date of Birth: December 22, 1970 Referring Provider: Barton Fanny, NP  Encounter Date: 11/27/2016      PT End of Session - 11/27/16 1235    Visit Number 3   Number of Visits 13   Date for PT Re-Evaluation 12/08/16   Authorization Type BCBS Other   Authorization Time Period 30 visits for benefit period   PT Start Time 1115   PT Stop Time 1203   PT Time Calculation (min) 48 min   Activity Tolerance Patient tolerated treatment well;No increased pain   Behavior During Therapy WFL for tasks assessed/performed      Past Medical History:  Diagnosis Date  . Contraceptive management 02/15/2015  . GERD without esophagitis   . Hep C w/o coma, chronic ([place])   . Hx of migraines   . Nexplanon removal 02/15/2015    Past Surgical History:  Procedure Laterality Date  . CESAREAN SECTION    . CHOLECYSTECTOMY      There were no vitals filed for this visit.      Subjective Assessment - 11/27/16 1117    Subjective Pt reports she is still feelign much better today since manual therapy last session. Her HA pain is decreased. HEP is going well.    Pertinent History Hep C, GERD, asthma   Currently in Pain? Yes   Pain Score 3                          [facility] Adult PT Treatment/Exercise - 11/27/16 0001      Neck Exercises: Standing   Other Standing Exercises Standing scaption with blue TB: 1x10 bilat  Standing overhead reach 1x15: cues for thoracic extension     Neck Exercises: Seated   X to V 15 reps;Other (comment)   X to V Weights (lbs) verbal cues to maintain related necl/TMJ  tactile feedback for thoracoscapular rhythm   W Back Weights (lbs) W + scap squeeze: 15x5sec hold   W Back Limitations --  verbal cues to maintain related necl/TMJ     Neck  Exercises: Supine   Neck Retraction 15 reps  into 2 pillows, 5sec each   Cervical Rotation Right  3x30sec with overpressure   Cervical Rotation Limitations woresening Rt lateral neck pain with sustained stretch, more consistent with C2 radicular Sx than Ut trigger point referral      Manual Therapy   Manual Therapy Myofascial release;Scapular mobilization   Manual therapy comments Rt scapular posterior tilt stretch: 3x30sec   Myofascial Release Right UT, bilat temporalis, bilat masseter,, Rt. Pec minor   Manual Traction cervical traction in supine x 3 mins; pt reported feeilng a little better following  Suboccipital release in cervical extensionx 5 mintutes                  PT Short Term Goals - 11/25/16 1220      PT SHORT TERM GOAL #1   Title Pt will be independent with HEP and perform correctly and consistenly to decrease pain and maximize return to PLOF .   Time 3   Period Weeks   Status On-going     PT SHORT TERM GOAL #2   Title Pt will be able to attain and maintain proper sitting posture 75% of the time or greater in  order to help reduce symptoms and maximize return to PLOF.   Time 3   Period Weeks   Status On-going     PT SHORT TERM GOAL #3   Title Pt will be able to drive for 1 hour or greater with 3/10 pain or less to maximize participation in the community and demonstrate improved overall functin.   Time 3   Period Weeks   Status On-going           PT Long Term Goals - 11/25/16 1221      PT LONG TERM GOAL #1   Title Pt will be able to sleep through the night with 2 awakenings or less due to pain to maximize function and promote recovery to PLOF.   Time 6   Period Weeks   Status On-going     PT LONG TERM GOAL #2   Title Pt will have improved BUE strength to 5/5 to demonstrate improved overall function and maximize her ability to perform house and work duties.   Time 6   Period Weeks   Status On-going     PT LONG TERM GOAL #3   Title Pt will  report decreased frequency of migraines to 2 a week or less to facilitate improved participation at home and in the community and demonstrate improved overall function.   Time 6   Period Weeks   Status On-going     PT LONG TERM GOAL #4   Title Pt will have improved cervical AROM with minimal to no pain in either UE to demonstrate improved overall function and allow pt to return to the gym.   Time 6   Period Weeks   Status On-going               Plan - 11/27/16 1236    Clinical Impression Statement Continued manual therapy to explore other contributory myofascial trigger points. Extensive release performed at bilat temporalis and masseter muscles, with greater pain in both on left side, requiring very gentl epressure. Several noted twitch reponses elicited with imporved jaw mobility thereafter. Pt able to perform Rt cervical ROM in supine without pain, but Rt lateral neck symptoms come on toward end of 30 seconds, more consistent with high cervical radicular pain, rather than upper trap myofascial referreed pain:: warrants further investigation in future visits. Noted Rt scapula stuck in anterior tilt, very responsive to stretching, and then thoracoscapular rhythm mobility training and movement coordination are performed extensively in multiple positions, with and without resistance. No changes to POC at this time. Pt making good progress overall. toward all goals.    Rehab Potential Good   Clinical Impairments Affecting Rehab Potential (+): young, motivated; (-): chronicity of issue   PT Frequency 2x / week   PT Duration 6 weeks   PT Treatment/Interventions ADLs/Self Care Home Management;Cryotherapy;Electrical Stimulation;Moist Heat;Therapeutic activities;Therapeutic exercise;Patient/family education;Manual techniques;Passive range of motion;Dry needling;Neuromuscular re-education   PT Next Visit Plan Continue to progress postural strengthening with cues to avoid teeth clenching/neck  tightening; give cervical and scapular retraction handout for HEP if patient reports tolerated well atter previous sessions. Examine Rt rotation symptoms with various degrees of Rt scapular elevation.    PT Home Exercise Plan eval: seated UT stretch with mild OP, R cervical rotation AAROM with towel   Consulted and Agree with Plan of Care Patient      Patient will benefit from skilled therapeutic intervention in order to improve the following deficits and impairments:  Decreased mobility, Decreased range of motion,  Decreased strength, Hypomobility, Increased muscle spasms, Impaired flexibility, Improper body mechanics, Pain  Visit Diagnosis: Chronic migraine  Stiffness of neck  Cervicogenic migraine     Problem List Patient Active Problem List   Diagnosis Date Noted  . Perennial and seasonal allergic rhinitis 01/29/2016  . Seasonal allergic conjunctivitis 01/29/2016  . Moderate persistent asthma with mild exacerbation 01/29/2016  . Nexplanon removal 02/15/2015  . Contraceptive management 02/15/2015  . DUB (dysfunctional uterine bleeding) 06/10/2013    12:43 PM, 11/27/16 Etta Grandchild, PT, DPT Physical Therapist at [facility] [phone] (office)     [facility] [place], [place], [address] Phone: [phone]   Fax:  [phone]  Name: Endia Moncur MRN: [id number] Date of Birth: [birth year]

## 2016-12-01 ENCOUNTER — Ambulatory Visit (HOSPITAL_COMMUNITY): Payer: BLUE CROSS/BLUE SHIELD

## 2016-12-01 ENCOUNTER — Encounter (HOSPITAL_COMMUNITY): Payer: Self-pay

## 2016-12-01 DIAGNOSIS — IMO0002 Reserved for concepts with insufficient information to code with codable children: Secondary | ICD-10-CM

## 2016-12-01 DIAGNOSIS — G43809 Other migraine, not intractable, without status migrainosus: Secondary | ICD-10-CM

## 2016-12-01 DIAGNOSIS — M436 Torticollis: Secondary | ICD-10-CM

## 2016-12-01 DIAGNOSIS — G43709 Chronic migraine without aura, not intractable, without status migrainosus: Secondary | ICD-10-CM | POA: Diagnosis not present

## 2016-12-01 NOTE — Patient Instructions (Signed)
  Cervical Retraction Amber Russell  While maintaining good posture, sit neutrally and place your pointer finger against your chin while looking straight ahead. Without moving the finger, draw your head directly backwards, keeping your gaze parallel to the floor. Return to neutral, bringing your chin back against your pointer finger. Make sure you are not going past the neutral neck position.  Perform 1x/day, 2-3 sets of 10 and hold for 2-3 seconds each

## 2016-12-01 NOTE — Therapy (Signed)
[facility], [place], [address] Phone: [phone]   Fax:  [phone]  Physical Therapy Treatment  Patient Details  Name: Amber Russell MRN: [id number] Date of Birth: [birth year] Referring Provider: Barton Fanny, NP  Encounter Date: 12/01/2016      PT End of Session - 12/01/16 1121    Visit Number 4   Number of Visits 13   Date for PT Re-Evaluation 12/08/16   Authorization Type BCBS Other   Authorization Time Period 30 visits for benefit period   PT Start Time 1115   PT Stop Time 1157   PT Time Calculation (min) 42 min   Activity Tolerance Patient tolerated treatment well;No increased pain   Behavior During Therapy WFL for tasks assessed/performed      Past Medical History:  Diagnosis Date  . Contraceptive management 02/15/2015  . GERD without esophagitis   . Hep C w/o coma, chronic ([place])   . Hx of migraines   . Nexplanon removal 02/15/2015    Past Surgical History:  Procedure Laterality Date  . CESAREAN SECTION    . CHOLECYSTECTOMY      There were no vitals filed for this visit.      Subjective Assessment - 12/01/16 1117    Subjective Pt states that she is continuing to feel better. She states her L jaw was aching and was hard for her to chew on that side 1 hour after last treatment session, but she is better now.   Pertinent History Hep C, GERD, asthma   Currently in Pain? No/denies              [facility] Adult PT Treatment/Exercise - 12/01/16 0001      Neck Exercises: Seated   Other Seated Exercise cervical retractions in sitting with proper posture, 2 sets x 10 reps   Other Seated Exercise bil scap retraction with RTB, 2x10; mod-max cues for proper technique     Manual Therapy   Manual Therapy Soft tissue mobilization;Joint mobilization   Manual therapy comments manual complete separate rest of treatment   Joint Mobilization Grade 1 CPAs to C4 and C6, 3 sets of 45 sec bouts at each;  pt reported that CPA to C6 recreated her pain when she has a HA/migraine; pt had mild tenderness but did not recreate her pain with oscillations at other segments   Soft tissue mobilization R upper trap, cervical paraspinals                PT Education - 12/01/16 1206    Education provided Yes   Education Details continue HEP, added cervical retraction to HEP; proper exercise technique, proper sitting posture   Person(s) Educated Patient   Methods Explanation;Demonstration   Comprehension Verbalized understanding;Returned demonstration          PT Short Term Goals - 11/25/16 1220      PT SHORT TERM GOAL #1   Title Pt will be independent with HEP and perform correctly and consistenly to decrease pain and maximize return to PLOF .   Time 3   Period Weeks   Status On-going     PT SHORT TERM GOAL #2   Title Pt will be able to attain and maintain proper sitting posture 75% of the time or greater in order to help reduce symptoms and maximize return to PLOF.   Time 3   Period Weeks   Status On-going     PT SHORT TERM GOAL #3   Title  Pt will be able to drive for 1 hour or greater with 3/10 pain or less to maximize participation in the community and demonstrate improved overall functin.   Time 3   Period Weeks   Status On-going           PT Long Term Goals - 11/25/16 1221      PT LONG TERM GOAL #1   Title Pt will be able to sleep through the night with 2 awakenings or less due to pain to maximize function and promote recovery to PLOF.   Time 6   Period Weeks   Status On-going     PT LONG TERM GOAL #2   Title Pt will have improved BUE strength to 5/5 to demonstrate improved overall function and maximize her ability to perform house and work duties.   Time 6   Period Weeks   Status On-going     PT LONG TERM GOAL #3   Title Pt will report decreased frequency of migraines to 2 a week or less to facilitate improved participation at home and in the community and  demonstrate improved overall function.   Time 6   Period Weeks   Status On-going     PT LONG TERM GOAL #4   Title Pt will have improved cervical AROM with minimal to no pain in either UE to demonstrate improved overall function and allow pt to return to the gym.   Time 6   Period Weeks   Status On-going               Plan - 12/01/16 1207    Clinical Impression Statement Pt presents to therapy with continued reports of improving symptoms. She did have a HA yesterday and the day before but she reported that they were not as bad. Pt continues with increased soft tissue restrictions of R upper trap which recreate her pain with palpation. Pt tolerated manual well again this session and reported no pain following. Pt had deficits in CPAs throughout cervical spine but her pain was recreated with Grade 1 CPAs to C6>C4; following prolonged bouts, pt reported her pain decreased at these segments. Her sitting posture continues to be impaired and requires cues for correction. Added cervical retraction to HEP and pt needs continued skilled PT intervention to maximize overall function and decrease pain.   Rehab Potential Good   Clinical Impairments Affecting Rehab Potential (+): young, motivated; (-): chronicity of issue   PT Frequency 2x / week   PT Duration 6 weeks   PT Treatment/Interventions ADLs/Self Care Home Management;Cryotherapy;Electrical Stimulation;Moist Heat;Therapeutic activities;Therapeutic exercise;Patient/family education;Manual techniques;Passive range of motion;Dry needling;Neuromuscular re-education   PT Next Visit Plan Continue to progress postural strengthening with cues to avoid teeth clenching/neck tightening; Examine Rt rotation symptoms with various degrees of Rt scapular elevation; provided cervical retraction handout for HEP but not for scap retraction as pt had increased compensation throughout exercise; continue manual therapy to upper traps, cervical musculature, and TMJ  musculature.   PT Home Exercise Plan eval: seated UT stretch with mild OP, R cervical rotation AAROM with towel; 3/19: cervical retraction   Consulted and Agree with Plan of Care Patient      Patient will benefit from skilled therapeutic intervention in order to improve the following deficits and impairments:  Decreased mobility, Decreased range of motion, Decreased strength, Hypomobility, Increased muscle spasms, Impaired flexibility, Improper body mechanics, Pain  Visit Diagnosis: Chronic migraine  Stiffness of neck  Cervicogenic migraine     Problem List  Patient Active Problem List   Diagnosis Date Noted  . Perennial and seasonal allergic rhinitis 01/29/2016  . Seasonal allergic conjunctivitis 01/29/2016  . Moderate persistent asthma with mild exacerbation 01/29/2016  . Nexplanon removal 02/15/2015  . Contraceptive management 02/15/2015  . DUB (dysfunctional uterine bleeding) 06/10/2013     Geraldine Solar PT, DPT   [facility] [address] [place], [place], [address] Phone: [phone]   Fax:  [phone]  Name: Joyel Chenette MRN: [id number] Date of Birth: 25-Aug-1971

## 2016-12-02 ENCOUNTER — Ambulatory Visit (INDEPENDENT_AMBULATORY_CARE_PROVIDER_SITE_OTHER): Payer: BLUE CROSS/BLUE SHIELD | Admitting: *Deleted

## 2016-12-02 DIAGNOSIS — J309 Allergic rhinitis, unspecified: Secondary | ICD-10-CM

## 2016-12-03 ENCOUNTER — Telehealth (HOSPITAL_COMMUNITY): Payer: Self-pay

## 2016-12-03 ENCOUNTER — Ambulatory Visit (HOSPITAL_COMMUNITY): Payer: BLUE CROSS/BLUE SHIELD

## 2016-12-03 NOTE — Telephone Encounter (Signed)
No show #1. PT called pt regarding missed appointment. She stated she forgot about the appointment for this morning. PT asked if pt planned on attending her future appointments and she said yes. Reminded pt of next appointment time and she verbalized understanding.   Geraldine Solar PT, DPT

## 2016-12-08 ENCOUNTER — Telehealth (HOSPITAL_COMMUNITY): Payer: Self-pay

## 2016-12-08 ENCOUNTER — Ambulatory Visit (HOSPITAL_COMMUNITY): Payer: BLUE CROSS/BLUE SHIELD

## 2016-12-08 NOTE — Telephone Encounter (Signed)
She can not come today °

## 2016-12-09 ENCOUNTER — Ambulatory Visit (INDEPENDENT_AMBULATORY_CARE_PROVIDER_SITE_OTHER): Payer: BLUE CROSS/BLUE SHIELD | Admitting: *Deleted

## 2016-12-09 DIAGNOSIS — J309 Allergic rhinitis, unspecified: Secondary | ICD-10-CM | POA: Diagnosis not present

## 2016-12-10 ENCOUNTER — Ambulatory Visit (HOSPITAL_COMMUNITY): Payer: BLUE CROSS/BLUE SHIELD

## 2016-12-15 ENCOUNTER — Ambulatory Visit (HOSPITAL_COMMUNITY): Payer: BLUE CROSS/BLUE SHIELD | Attending: Neurology | Admitting: Physical Therapy

## 2016-12-15 DIAGNOSIS — M436 Torticollis: Secondary | ICD-10-CM | POA: Insufficient documentation

## 2016-12-15 DIAGNOSIS — IMO0002 Reserved for concepts with insufficient information to code with codable children: Secondary | ICD-10-CM

## 2016-12-15 DIAGNOSIS — G43709 Chronic migraine without aura, not intractable, without status migrainosus: Secondary | ICD-10-CM | POA: Diagnosis not present

## 2016-12-15 DIAGNOSIS — G43809 Other migraine, not intractable, without status migrainosus: Secondary | ICD-10-CM | POA: Diagnosis present

## 2016-12-15 NOTE — Therapy (Signed)
[place], [place], [address] Phone: [phone]   Fax:  [phone]  Physical Therapy Treatment  Patient Details  Name: Amber Russell MRN: [id number] Date of Birth: 12-Oct-1970 Referring Provider: Barton Fanny, NP  Encounter Date: 12/15/2016      PT End of Session - 12/15/16 1240    Visit Number 5   Number of Visits 13   Date for PT Re-Evaluation 12/08/16   Authorization Type BCBS Other   Authorization Time Period 30 visits for benefit period   PT Start Time 1120   PT Stop Time 1200   PT Time Calculation (min) 40 min   Activity Tolerance Patient tolerated treatment well;No increased pain   Behavior During Therapy WFL for tasks assessed/performed      Past Medical History:  Diagnosis Date  . Contraceptive management 02/15/2015  . GERD without esophagitis   . Hep C w/o coma, chronic ([place])   . Hx of migraines   . Nexplanon removal 02/15/2015    Past Surgical History:  Procedure Laterality Date  . CESAREAN SECTION    . CHOLECYSTECTOMY      There were no vitals filed for this visit.      Subjective Assessment - 12/15/16 1212    Subjective Pt states she continues to have headaches every day.  Currently 5/10 and pain runs posterior cervical into bilateral trap region.   Currently in Pain? Yes   Pain Score 5    Pain Location Neck   Pain Orientation Right   Pain Descriptors / Indicators Aching                         [facility] Adult PT Treatment/Exercise - 12/15/16 0001      Neck Exercises: Standing   Other Standing Exercises RTB postural strengthening:  scap retraction, extensions, rows 15 reps each     Neck Exercises: Seated   X to V 15 reps;Other (comment)   W Back Weights (lbs) W + scap squeeze: 15x5sec hold     Manual Therapy   Manual Therapy Soft tissue mobilization;Myofascial release   Manual therapy comments manual complete separate rest of treatment   Soft tissue  mobilization seated to bilateral traps and cervical musculature   Myofascial Release suboccipital release in supine   Manual Traction cervical traction in supine x 3 mins; pt reported feeilng a little better following                  PT Short Term Goals - 11/25/16 1220      PT SHORT TERM GOAL #1   Title Pt will be independent with HEP and perform correctly and consistenly to decrease pain and maximize return to PLOF .   Time 3   Period Weeks   Status On-going     PT SHORT TERM GOAL #2   Title Pt will be able to attain and maintain proper sitting posture 75% of the time or greater in order to help reduce symptoms and maximize return to PLOF.   Time 3   Period Weeks   Status On-going     PT SHORT TERM GOAL #3   Title Pt will be able to drive for 1 hour or greater with 3/10 pain or less to maximize participation in the community and demonstrate improved overall functin.   Time 3   Period Weeks   Status On-going           PT  Long Term Goals - 11/25/16 1221      PT LONG TERM GOAL #1   Title Pt will be able to sleep through the night with 2 awakenings or less due to pain to maximize function and promote recovery to PLOF.   Time 6   Period Weeks   Status On-going     PT LONG TERM GOAL #2   Title Pt will have improved BUE strength to 5/5 to demonstrate improved overall function and maximize her ability to perform house and work duties.   Time 6   Period Weeks   Status On-going     PT LONG TERM GOAL #3   Title Pt will report decreased frequency of migraines to 2 a week or less to facilitate improved participation at home and in the community and demonstrate improved overall function.   Time 6   Period Weeks   Status On-going     PT LONG TERM GOAL #4   Title Pt will have improved cervical AROM with minimal to no pain in either UE to demonstrate improved overall function and allow pt to return to the gym.   Time 6   Period Weeks   Status On-going                Plan - 12/15/16 1241    Clinical Impression Statement completed majority session focused on manual to decrease UE tightness and restrictions.  Good occipital release obtained and resolution of spasms in bilateral UT following manual.  Added postural strengthening with RTB with verbal and manual cues for form.  pt reported overall improvement with resolution of headache at end of session.   Rehab Potential Good   Clinical Impairments Affecting Rehab Potential (+): young, motivated; (-): chronicity of issue   PT Frequency 2x / week   PT Duration 6 weeks   PT Treatment/Interventions ADLs/Self Care Home Management;Cryotherapy;Electrical Stimulation;Moist Heat;Therapeutic activities;Therapeutic exercise;Patient/family education;Manual techniques;Passive range of motion;Dry needling;Neuromuscular re-education   PT Next Visit Plan Continue to progress postural strengthening and manual to upper traps, cervical musculature, and TMJ musculature.  Progress with prone strengthening when appropriate.    PT Home Exercise Plan eval: seated UT stretch with mild OP, R cervical rotation AAROM with towel; 3/19: cervical retraction   Consulted and Agree with Plan of Care Patient      Patient will benefit from skilled therapeutic intervention in order to improve the following deficits and impairments:  Decreased mobility, Decreased range of motion, Decreased strength, Hypomobility, Increased muscle spasms, Impaired flexibility, Improper body mechanics, Pain  Visit Diagnosis: Chronic migraine  Stiffness of neck  Cervicogenic migraine     Problem List Patient Active Problem List   Diagnosis Date Noted  . Perennial and seasonal allergic rhinitis 01/29/2016  . Seasonal allergic conjunctivitis 01/29/2016  . Moderate persistent asthma with mild exacerbation 01/29/2016  . Nexplanon removal 02/15/2015  . Contraceptive management 02/15/2015  . DUB (dysfunctional uterine bleeding) 06/10/2013     Amber Russell, PTA/CLT [phone]  12/15/2016, 12:46 PM  [facility] [address] [place], [place], [address] Phone: [phone]   Fax:  [phone]  Name: Amber Russell MRN: [id number] Date of Birth: [birth year]

## 2016-12-16 ENCOUNTER — Ambulatory Visit (INDEPENDENT_AMBULATORY_CARE_PROVIDER_SITE_OTHER): Payer: BLUE CROSS/BLUE SHIELD | Admitting: *Deleted

## 2016-12-16 DIAGNOSIS — J309 Allergic rhinitis, unspecified: Secondary | ICD-10-CM | POA: Diagnosis not present

## 2016-12-17 ENCOUNTER — Ambulatory Visit (HOSPITAL_COMMUNITY): Payer: BLUE CROSS/BLUE SHIELD | Admitting: Physical Therapy

## 2016-12-17 ENCOUNTER — Telehealth (HOSPITAL_COMMUNITY): Payer: Self-pay | Admitting: Internal Medicine

## 2016-12-17 NOTE — Telephone Encounter (Signed)
12/17/16 pt cx said that she woke up sick this morning, running a fever

## 2016-12-19 ENCOUNTER — Telehealth (HOSPITAL_COMMUNITY): Payer: Self-pay

## 2016-12-19 NOTE — Telephone Encounter (Signed)
She states she can not be here on Monday and will call back to reshedule.

## 2016-12-22 ENCOUNTER — Ambulatory Visit (HOSPITAL_COMMUNITY): Payer: BLUE CROSS/BLUE SHIELD

## 2016-12-23 ENCOUNTER — Ambulatory Visit (INDEPENDENT_AMBULATORY_CARE_PROVIDER_SITE_OTHER): Payer: BLUE CROSS/BLUE SHIELD | Admitting: *Deleted

## 2016-12-23 DIAGNOSIS — J309 Allergic rhinitis, unspecified: Secondary | ICD-10-CM

## 2016-12-30 ENCOUNTER — Ambulatory Visit (INDEPENDENT_AMBULATORY_CARE_PROVIDER_SITE_OTHER): Payer: BLUE CROSS/BLUE SHIELD | Admitting: *Deleted

## 2016-12-30 DIAGNOSIS — J309 Allergic rhinitis, unspecified: Secondary | ICD-10-CM | POA: Diagnosis not present

## 2017-01-01 ENCOUNTER — Other Ambulatory Visit: Payer: Self-pay | Admitting: Obstetrics & Gynecology

## 2017-01-01 DIAGNOSIS — N644 Mastodynia: Secondary | ICD-10-CM

## 2017-01-06 ENCOUNTER — Other Ambulatory Visit: Payer: BLUE CROSS/BLUE SHIELD

## 2017-01-06 ENCOUNTER — Ambulatory Visit (INDEPENDENT_AMBULATORY_CARE_PROVIDER_SITE_OTHER): Payer: BLUE CROSS/BLUE SHIELD | Admitting: *Deleted

## 2017-01-06 DIAGNOSIS — J309 Allergic rhinitis, unspecified: Secondary | ICD-10-CM

## 2017-01-08 ENCOUNTER — Ambulatory Visit
Admission: RE | Admit: 2017-01-08 | Discharge: 2017-01-08 | Disposition: A | Discharge status: Home / Self Care | Payer: BLUE CROSS/BLUE SHIELD | Source: Ambulatory Visit | Attending: Obstetrics & Gynecology | Admitting: Obstetrics & Gynecology

## 2017-01-08 DIAGNOSIS — N644 Mastodynia: Secondary | ICD-10-CM

## 2017-01-13 ENCOUNTER — Ambulatory Visit (INDEPENDENT_AMBULATORY_CARE_PROVIDER_SITE_OTHER): Payer: BLUE CROSS/BLUE SHIELD | Admitting: *Deleted

## 2017-01-13 DIAGNOSIS — J309 Allergic rhinitis, unspecified: Secondary | ICD-10-CM

## 2017-01-20 ENCOUNTER — Ambulatory Visit (INDEPENDENT_AMBULATORY_CARE_PROVIDER_SITE_OTHER): Payer: BLUE CROSS/BLUE SHIELD | Admitting: *Deleted

## 2017-01-20 DIAGNOSIS — J309 Allergic rhinitis, unspecified: Secondary | ICD-10-CM

## 2017-01-27 ENCOUNTER — Ambulatory Visit (INDEPENDENT_AMBULATORY_CARE_PROVIDER_SITE_OTHER): Payer: BLUE CROSS/BLUE SHIELD | Admitting: *Deleted

## 2017-01-27 ENCOUNTER — Encounter: Payer: Self-pay | Admitting: *Deleted

## 2017-01-27 DIAGNOSIS — J309 Allergic rhinitis, unspecified: Secondary | ICD-10-CM | POA: Diagnosis not present

## 2017-01-27 NOTE — Progress Notes (Signed)
Maintenance vials made  

## 2017-01-28 DIAGNOSIS — J3081 Allergic rhinitis due to animal (cat) (dog) hair and dander: Secondary | ICD-10-CM | POA: Diagnosis not present

## 2017-02-03 ENCOUNTER — Ambulatory Visit (INDEPENDENT_AMBULATORY_CARE_PROVIDER_SITE_OTHER): Payer: BLUE CROSS/BLUE SHIELD | Admitting: *Deleted

## 2017-02-03 DIAGNOSIS — J309 Allergic rhinitis, unspecified: Secondary | ICD-10-CM | POA: Diagnosis not present

## 2017-02-10 ENCOUNTER — Ambulatory Visit (INDEPENDENT_AMBULATORY_CARE_PROVIDER_SITE_OTHER): Payer: BLUE CROSS/BLUE SHIELD | Admitting: *Deleted

## 2017-02-10 DIAGNOSIS — J309 Allergic rhinitis, unspecified: Secondary | ICD-10-CM

## 2017-02-17 ENCOUNTER — Ambulatory Visit (INDEPENDENT_AMBULATORY_CARE_PROVIDER_SITE_OTHER): Payer: BLUE CROSS/BLUE SHIELD | Admitting: *Deleted

## 2017-02-17 DIAGNOSIS — J309 Allergic rhinitis, unspecified: Secondary | ICD-10-CM | POA: Diagnosis not present

## 2017-02-24 ENCOUNTER — Ambulatory Visit (INDEPENDENT_AMBULATORY_CARE_PROVIDER_SITE_OTHER): Payer: BLUE CROSS/BLUE SHIELD | Admitting: *Deleted

## 2017-02-24 DIAGNOSIS — J309 Allergic rhinitis, unspecified: Secondary | ICD-10-CM

## 2017-03-03 ENCOUNTER — Emergency Department (HOSPITAL_COMMUNITY)
Admission: EM | Admit: 2017-03-03 | Discharge: 2017-03-03 | Disposition: A | Discharge status: Home / Self Care | Payer: BLUE CROSS/BLUE SHIELD | Attending: Emergency Medicine | Admitting: Emergency Medicine

## 2017-03-03 ENCOUNTER — Encounter (HOSPITAL_COMMUNITY): Payer: Self-pay | Admitting: Emergency Medicine

## 2017-03-03 ENCOUNTER — Ambulatory Visit (INDEPENDENT_AMBULATORY_CARE_PROVIDER_SITE_OTHER): Payer: BLUE CROSS/BLUE SHIELD | Admitting: *Deleted

## 2017-03-03 DIAGNOSIS — Z79899 Other long term (current) drug therapy: Secondary | ICD-10-CM | POA: Insufficient documentation

## 2017-03-03 DIAGNOSIS — J309 Allergic rhinitis, unspecified: Secondary | ICD-10-CM

## 2017-03-03 DIAGNOSIS — G43909 Migraine, unspecified, not intractable, without status migrainosus: Secondary | ICD-10-CM

## 2017-03-03 MED ORDER — SODIUM CHLORIDE 0.9 % IV BOLUS (SEPSIS)
500.0000 mL | Freq: Once | INTRAVENOUS | Status: AC
  Administered 2017-03-03: 500 mL via INTRAVENOUS

## 2017-03-03 MED ORDER — KETOROLAC TROMETHAMINE 30 MG/ML IJ SOLN
15.0000 mg | Freq: Once | INTRAMUSCULAR | Status: AC
  Administered 2017-03-03: 15 mg via INTRAVENOUS
  Filled 2017-03-03: qty 1

## 2017-03-03 MED ORDER — PROCHLORPERAZINE EDISYLATE 5 MG/ML IJ SOLN
10.0000 mg | Freq: Once | INTRAMUSCULAR | Status: AC
  Administered 2017-03-03: 10 mg via INTRAVENOUS
  Filled 2017-03-03: qty 2

## 2017-03-03 NOTE — ED Triage Notes (Signed)
Pt reports headache since last night. Pt reports has recently started taking sumatriptan injections last week. Last dose this am approximately thirty minutes pta. Pt reports light and sound sensitivity.

## 2017-03-03 NOTE — ED Provider Notes (Signed)
[facility] DEPT Provider Note   CSN: [id number] Arrival date & time: 03/03/17  0807     History   Chief Complaint Chief Complaint  Patient presents with  . Headache    HPI Amber Russell is a 46 y.o. female.  HPI Patient presents with headache. Has had it since last night. History of chronic migraines. Reportedly cervicogenic. Around 5 days ago had Botox injections. Pain is gotten worse. Pain is typical pain for headaches more severe. Comes from the back of her neck and goes around to her head. Has had nausea. Has had photophobia. No fevers. No trauma. Also started on sumatriptan. No improvement of the pain. No fevers. Past Medical History:  Diagnosis Date  . Contraceptive management 02/15/2015  . GERD without esophagitis   . Hep C w/o coma, chronic ([place])   . Hx of migraines   . Nexplanon removal 02/15/2015    Patient Active Problem List   Diagnosis Date Noted  . Perennial and seasonal allergic rhinitis 01/29/2016  . Seasonal allergic conjunctivitis 01/29/2016  . Moderate persistent asthma with mild exacerbation 01/29/2016  . Nexplanon removal 02/15/2015  . Contraceptive management 02/15/2015  . DUB (dysfunctional uterine bleeding) 06/10/2013    Past Surgical History:  Procedure Laterality Date  . CESAREAN SECTION    . CHOLECYSTECTOMY      OB History    Gravida Para Term Preterm AB Living   3 3 3     3    SAB TAB Ectopic Multiple Live Births                   Home Medications    Prior to Admission medications   Medication Sig Start Date End Date Taking? Authorizing Provider  albuterol (VENTOLIN HFA) 108 (90 Base) MCG/ACT inhaler Inhale 2 puffs into the lungs every 6 (six) hours as needed for wheezing or shortness of breath.   Yes [provider]  ibuprofen (ADVIL,MOTRIN) 800 MG tablet Take 800 mg by mouth daily. 10/03/16  Yes [provider]  levocetirizine (XYZAL) 5 MG tablet Take 1 tablet (5 mg total) by mouth every evening. 01/29/16   Yes Bobbitt, Sedalia Muta, MD  loratadine (CLARITIN) 10 MG tablet Take 10 mg by mouth daily. 08/18/16  Yes [provider]  amoxicillin-clavulanate (AUGMENTIN) 500-125 MG tablet Take 1 tablet by mouth daily. 09/09/16   [provider]  atenolol (TENORMIN) 25 MG tablet Take 25 mg by mouth daily. 10/03/16   [provider]  Azelastine-Fluticasone (DYMISTA) 137-50 MCG/ACT SUSP Place 1 spray into both nostrils 2 (two) times daily. Patient not taking: Reported on 03/03/2017 01/29/16   Bobbitt, Sedalia Muta, MD  budesonide-formoterol [facility]) 160-4.5 MCG/ACT inhaler Inhale 2 puffs into the lungs 2 (two) times daily. Use with spacer Rinse Gargle and Spit after use Patient not taking: Reported on 03/03/2017 01/29/16   Bobbitt, Sedalia Muta, MD  Dexlansoprazole (DEXILANT) 30 MG capsule Take 1 capsule (30 mg total) by mouth daily. Patient not taking: Reported on 03/03/2017 10/07/16   Valentina Shaggy, MD  fluticasone [facility]) 50 MCG/ACT nasal spray Place 2 sprays into both nostrils daily.    [provider]  HYDROMET 5-1.5 MG/5ML syrup Take 5 mLs by mouth 4 (four) times daily. 09/18/16   [provider]  hyoscyamine (LEVSIN, ANASPAZ) 0.125 MG tablet Take 0.125 mg by mouth. 09/27/13   [provider]  levofloxacin (LEVAQUIN) 500 MG tablet Take 500 mg by mouth daily. 10/03/16   [provider]  meclizine (ANTIVERT) 12.5 MG  tablet Take 12.5 mg by mouth as needed. 10/03/16   [provider]  megestrol (MEGACE) 40 MG tablet 3 tablets a day for 5 days, 2 tablets a day for 5 days then 1 tablet daily Patient not taking: Reported on 03/03/2017 10/23/15   Florian Buff, MD  methocarbamol (ROBAXIN) 500 MG tablet Take 500 mg by mouth every 6 (six) hours as needed. 10/03/16   [provider]  methylPREDNISolone (MEDROL DOSEPAK) 4 MG TBPK tablet Take 4 mg by mouth daily. Daily until finished 10/03/16   [provider]  montelukast  (SINGULAIR) 10 MG tablet Take 10 mg by mouth at bedtime.    [provider]  NORLYDA 0.35 MG tablet Take 0.35 mg by mouth daily. 08/29/16   [provider]  Olopatadine HCl (PAZEO) 0.7 % SOLN Place 1 drop into both eyes daily. Patient not taking: Reported on 03/03/2017 01/29/16   Bobbitt, Sedalia Muta, MD  rizatriptan (MAXALT-MLT) 10 MG disintegrating tablet Take 10 mg by mouth every 2 (two) hours as needed. 09/29/16   [provider]  topiramate (TOPAMAX) 100 MG tablet Take 100 mg by mouth daily. 10/03/16   [provider]  traMADol (ULTRAM) 50 MG tablet Take 1 tablet (50 mg total) by mouth every 6 (six) hours as needed (headache not helped by tylenol or motrin). Patient not taking: Reported on 03/03/2017 04/28/16   Milton Ferguson, MD    Family History Family History  Problem Relation Age of Onset  . Allergic rhinitis Son   . Asthma Son     Social History Social History  Substance Use Topics  . Smoking status: Never Smoker  . Smokeless tobacco: Never Used  . Alcohol use No     Allergies   Patient has no known allergies.   Review of Systems Review of Systems  Constitutional: Negative for appetite change.  HENT: Negative for congestion.   Gastrointestinal: Positive for nausea.  Genitourinary: Negative for flank pain.  Musculoskeletal: Positive for neck pain.  Skin: Negative for rash and wound.  Neurological: Positive for headaches.  Psychiatric/Behavioral: Negative for confusion.     Physical Exam Updated Vital Signs BP 135/84 (BP Location: Right Arm)   Pulse 71   Temp 97.5 F (36.4 C) (Oral)   Resp 18   Ht 5\' 6"  (1.676 m)   Wt 72.6 kg (160 lb)   LMP 02/09/2017   SpO2 99%   BMI 25.82 kg/m   Physical Exam  Constitutional: She appears well-developed.  HENT:  Head: Atraumatic.  Neck: Neck supple.  Cardiovascular: Normal rate.   Pulmonary/Chest: Effort normal.  Musculoskeletal: She exhibits no edema.  Neurological: She is  alert.  Skin: Skin is warm. Capillary refill takes less than 2 seconds.     ED Treatments / Results  Labs (all labs ordered are listed, but only abnormal results are displayed) Labs Reviewed - No data to display  EKG  EKG Interpretation None       Radiology No results found.  Procedures Procedures (including critical care time)  Medications Ordered in ED Medications  ketorolac (TORADOL) 30 MG/ML injection 15 mg (15 mg Intravenous Given 03/03/17 0842)  prochlorperazine (COMPAZINE) injection 10 mg (10 mg Intravenous Given 03/03/17 0844)  sodium chloride 0.9 % bolus 500 mL (0 mLs Intravenous Stopped 03/03/17 0948)     Initial Impression / Assessment and Plan / ED Course  I have reviewed the triage vital signs and the nursing notes.  Pertinent labs & imaging results that were available  during my care of the patient were reviewed by me and considered in my medical decision making (see chart for details).     Patient with headache. History of same. Feels better after treatment. Will discharge home.  Final Clinical Impressions(s) / ED Diagnoses   Final diagnoses:  Migraine without status migrainosus, not intractable, unspecified migraine type    New Prescriptions Discharge Medication List as of 03/03/2017  9:44 AM       Davonna Belling, MD 03/03/17 1542

## 2017-03-03 NOTE — ED Notes (Signed)
Pt made aware to return if symptoms worsen or if any life threatening symptoms occur.   

## 2017-03-10 ENCOUNTER — Ambulatory Visit (INDEPENDENT_AMBULATORY_CARE_PROVIDER_SITE_OTHER): Payer: BLUE CROSS/BLUE SHIELD | Admitting: *Deleted

## 2017-03-10 DIAGNOSIS — J309 Allergic rhinitis, unspecified: Secondary | ICD-10-CM | POA: Diagnosis not present

## 2017-03-17 ENCOUNTER — Ambulatory Visit (INDEPENDENT_AMBULATORY_CARE_PROVIDER_SITE_OTHER): Payer: Self-pay | Admitting: *Deleted

## 2017-03-17 DIAGNOSIS — J309 Allergic rhinitis, unspecified: Secondary | ICD-10-CM

## 2017-03-24 ENCOUNTER — Ambulatory Visit (INDEPENDENT_AMBULATORY_CARE_PROVIDER_SITE_OTHER): Payer: Self-pay | Admitting: *Deleted

## 2017-03-24 DIAGNOSIS — J309 Allergic rhinitis, unspecified: Secondary | ICD-10-CM

## 2017-03-25 NOTE — Progress Notes (Signed)
VIALS EXP 03-26-18 

## 2017-03-27 DIAGNOSIS — J301 Allergic rhinitis due to pollen: Secondary | ICD-10-CM

## 2017-03-31 ENCOUNTER — Ambulatory Visit (INDEPENDENT_AMBULATORY_CARE_PROVIDER_SITE_OTHER): Payer: Self-pay | Admitting: *Deleted

## 2017-03-31 DIAGNOSIS — J309 Allergic rhinitis, unspecified: Secondary | ICD-10-CM

## 2017-04-07 ENCOUNTER — Ambulatory Visit: Payer: BLUE CROSS/BLUE SHIELD | Admitting: Allergy & Immunology

## 2017-05-06 ENCOUNTER — Telehealth: Payer: Self-pay | Admitting: Allergy & Immunology

## 2017-05-06 NOTE — Telephone Encounter (Signed)
Patient's insurance stopped.  She was unaware of the mt vial that was needed.  She made a $25 payment on her account and wants to continue payments of $25 a month.  I told her Juliann Pulse was on vacation this week.  She usually okay's payment plans.  (Because I thought she was wanting to continue immunotherapy only giving $25 a month). But she pretty much has decided not to continue injections until they get insurance back.  But still wants Juliann Pulse to call her next week.

## 2017-05-06 NOTE — Telephone Encounter (Signed)
Patient called wanting to know the balance of her bill. It was around $600. She said that cannot be right because she was told the last time she called that it was around $300. She paid $25, but would like a call back for exact remaining balance.

## 2017-05-11 NOTE — Telephone Encounter (Signed)
Set up on payment plan - will continue to pay $25/mo

## 2017-08-10 ENCOUNTER — Ambulatory Visit: Payer: Self-pay | Admitting: Orthopedic Surgery

## 2017-08-11 ENCOUNTER — Ambulatory Visit: Payer: BLUE CROSS/BLUE SHIELD | Admitting: Orthopedic Surgery

## 2017-08-11 ENCOUNTER — Ambulatory Visit (INDEPENDENT_AMBULATORY_CARE_PROVIDER_SITE_OTHER): Payer: BLUE CROSS/BLUE SHIELD

## 2017-08-11 VITALS — BP 122/65 | HR 52 | Ht 64.0 in | Wt 153.0 lb

## 2017-08-11 DIAGNOSIS — M545 Low back pain, unspecified: Secondary | ICD-10-CM

## 2017-08-11 MED ORDER — METHOCARBAMOL 500 MG PO TABS: 500.0000 mg | ORAL_TABLET | Freq: Four times a day (QID) | ORAL | 2 refills | Status: DC | PRN

## 2017-08-11 MED ORDER — IBUPROFEN 800 MG PO TABS: 800.0000 mg | ORAL_TABLET | Freq: Every day | ORAL | 0 refills | Status: DC

## 2017-08-11 NOTE — Progress Notes (Signed)
Progress Note   Patient ID: Amber Russell, female   DOB: 1971-05-03, 46 y.o.   MRN: [id number]  Chief Complaint  Patient presents with  . Back Pain    Back pain, no injury    46 year old female presents with a 76-month history of lower back pain  She cannot remember anything that caused the back pain it was insidious in onset it is now constant moderate, its dull aching primarily in the lower back and some in the lower to mid thoracic region.  Pain is more left-sided than right     Review of Systems  Constitutional: Negative.   Gastrointestinal: Negative.   Genitourinary: Negative.   Neurological: Negative.    No outpatient medications have been marked as taking for the 08/11/17 encounter (Office Visit) with Carole Civil, MD.   Current Facility-Administered Medications for the 08/11/17 encounter (Office Visit) with Carole Civil, MD  Medication  . predniSONE (DELTASONE) tablet 10 mg    Past Medical History:  Diagnosis Date  . Contraceptive management 02/15/2015  . GERD without esophagitis   . Hep C w/o coma, chronic ([place])   . Hx of migraines   . Nexplanon removal 02/15/2015     No Known Allergies  BP 122/65   Pulse (!) 52   Ht 5\' 4"  (1.626 m)   Wt 153 lb (69.4 kg)   LMP 08/06/2017 (Exact Date)   BMI 26.26 kg/m    Physical Exam  Constitutional: She is oriented to person, place, and time. She appears well-developed and well-nourished.  Musculoskeletal:       Arms:      Legs: Neurological: She is alert and oriented to person, place, and time.  Psychiatric: She has a normal mood and affect. Judgment normal.  Vitals reviewed.   Ortho Exam   Medical decision-making  Imaging: X-ray of the lumbar spine shows slight increase in lordosis coronal plane reactive and malalignment normal disc spaces no spondylolysis or listhesis  Encounter Diagnosis  Name Primary?  . Acute low back pain without sciatica, unspecified back pain laterality Yes       No orders of the defined types were placed in this encounter.    Arther Abbott, MD 08/11/2017 12:06 PM

## 2017-08-24 ENCOUNTER — Ambulatory Visit: Payer: Self-pay | Admitting: Obstetrics and Gynecology

## 2017-09-09 ENCOUNTER — Other Ambulatory Visit: Payer: Self-pay | Admitting: Orthopedic Surgery

## 2017-09-14 ENCOUNTER — Ambulatory Visit: Payer: Self-pay | Admitting: Obstetrics and Gynecology

## 2017-09-17 ENCOUNTER — Ambulatory Visit (INDEPENDENT_AMBULATORY_CARE_PROVIDER_SITE_OTHER): Payer: BLUE CROSS/BLUE SHIELD | Admitting: Otolaryngology

## 2017-09-17 DIAGNOSIS — H9203 Otalgia, bilateral: Secondary | ICD-10-CM | POA: Diagnosis not present

## 2017-09-17 DIAGNOSIS — H903 Sensorineural hearing loss, bilateral: Secondary | ICD-10-CM

## 2017-09-18 ENCOUNTER — Ambulatory Visit: Payer: BLUE CROSS/BLUE SHIELD | Admitting: Obstetrics and Gynecology

## 2017-09-18 ENCOUNTER — Encounter: Payer: Self-pay | Admitting: Obstetrics and Gynecology

## 2017-09-18 VITALS — BP 100/80 | HR 80 | Ht 64.0 in | Wt 154.8 lb

## 2017-09-18 DIAGNOSIS — Z8742 Personal history of other diseases of the female genital tract: Secondary | ICD-10-CM | POA: Diagnosis not present

## 2017-09-18 DIAGNOSIS — R102 Pelvic and perineal pain: Secondary | ICD-10-CM

## 2017-09-21 NOTE — Progress Notes (Signed)
[facility] Visit  @DATE @            Patient name: Amber Russell MRN [id number]  Date of birth: Dec 29, 1970  CC & HPI:  Amber Russell is a 47 y.o. female presenting today for pelvic pressure.  Menses regular  History of ovarian cyst in past. Responded to ocp use. Had irreg bleeding on Nexplanon. Declines consideration of IUD. Cycles are regular last 5 d ROS:  ROS   Pertinent History Reviewed:   Reviewed: Significant for Hx Hep C Medical         Past Medical History:  Diagnosis Date  . Contraceptive management 02/15/2015  . GERD without esophagitis   . Hep C w/o coma, chronic ([place])   . Hx of migraines   . Nexplanon removal 02/15/2015                              Surgical Hx:    Past Surgical History:  Procedure Laterality Date  . CESAREAN SECTION    . CHOLECYSTECTOMY     Medications: Reviewed & Updated - see associated section                       Current Outpatient Medications:  .  rizatriptan (MAXALT-MLT) 10 MG disintegrating tablet, Take 10 mg by mouth every 2 (two) hours as needed., Disp: , Rfl: 2 .  albuterol (VENTOLIN HFA) 108 (90 Base) MCG/ACT inhaler, Inhale 2 puffs into the lungs every 6 (six) hours as needed for wheezing or shortness of breath., Disp: , Rfl:  .  amoxicillin-clavulanate (AUGMENTIN) 500-125 MG tablet, Take 1 tablet by mouth daily., Disp: , Rfl: 0 .  atenolol (TENORMIN) 25 MG tablet, Take 25 mg by mouth daily., Disp: , Rfl: 1 .  Azelastine-Fluticasone (DYMISTA) 137-50 MCG/ACT SUSP, Place 1 spray into both nostrils 2 (two) times daily. (Patient not taking: Reported on 03/03/2017), Disp: 1 Bottle, Rfl: 5 .  budesonide-formoterol (SYMBICORT) 160-4.5 MCG/ACT inhaler, Inhale 2 puffs into the lungs 2 (two) times daily. Use with spacer Rinse Gargle and Spit after use (Patient not taking: Reported on 03/03/2017), Disp: 1 Inhaler, Rfl: 5 .  Dexlansoprazole (DEXILANT) 30 MG capsule, Take 1 capsule (30 mg total) by mouth daily. (Patient not taking:  Reported on 03/03/2017), Disp: 30 capsule, Rfl: 5 .  fluticasone (FLONASE) 50 MCG/ACT nasal spray, Place 2 sprays into both nostrils daily., Disp: , Rfl:  .  HYDROMET 5-1.5 MG/5ML syrup, Take 5 mLs by mouth 4 (four) times daily., Disp: , Rfl: 0 .  hyoscyamine (LEVSIN, ANASPAZ) 0.125 MG tablet, Take 0.125 mg by mouth., Disp: , Rfl:  .  ibuprofen (ADVIL,MOTRIN) 800 MG tablet, TAKE 1 TABLET(800 MG) BY MOUTH DAILY (Patient not taking: Reported on 09/18/2017), Disp: 90 tablet, Rfl: 1 .  levocetirizine (XYZAL) 5 MG tablet, Take 1 tablet (5 mg total) by mouth every evening. (Patient not taking: Reported on 09/18/2017), Disp: 30 tablet, Rfl: 5 .  levofloxacin (LEVAQUIN) 500 MG tablet, Take 500 mg by mouth daily., Disp: , Rfl: 1 .  loratadine (CLARITIN) 10 MG tablet, Take 10 mg by mouth daily., Disp: , Rfl: 0 .  meclizine (ANTIVERT) 12.5 MG tablet, Take 12.5 mg by mouth as needed., Disp: , Rfl: 1 .  megestrol (MEGACE) 40 MG tablet, 3 tablets a day for 5 days, 2 tablets a day for 5 days then 1 tablet daily (Patient not taking: Reported on 03/03/2017), Disp:  45 tablet, Rfl: 3 .  methocarbamol (ROBAXIN) 500 MG tablet, Take 1 tablet (500 mg total) by mouth every 6 (six) hours as needed. (Patient not taking: Reported on 09/18/2017), Disp: 60 tablet, Rfl: 2 .  methylPREDNISolone (MEDROL DOSEPAK) 4 MG TBPK tablet, Take 4 mg by mouth daily. Daily until finished, Disp: , Rfl: 0 .  montelukast (SINGULAIR) 10 MG tablet, Take 10 mg by mouth at bedtime., Disp: , Rfl:  .  NORLYDA 0.35 MG tablet, Take 0.35 mg by mouth daily., Disp: , Rfl: 11 .  Olopatadine HCl (PAZEO) 0.7 % SOLN, Place 1 drop into both eyes daily. (Patient not taking: Reported on 03/03/2017), Disp: 1 Bottle, Rfl: 5 .  topiramate (TOPAMAX) 100 MG tablet, Take 100 mg by mouth daily., Disp: , Rfl: 0 .  traMADol (ULTRAM) 50 MG tablet, Take 1 tablet (50 mg total) by mouth every 6 (six) hours as needed (headache not helped by tylenol or motrin). (Patient not taking:  Reported on 09/18/2017), Disp: 10 tablet, Rfl: 0  Current Facility-Administered Medications:  .  predniSONE (DELTASONE) tablet 10 mg, 10 mg, Oral, UD, Bobbitt, Sedalia Muta, MD   Social History: Reviewed -  reports that  has never smoked. she has never used smokeless tobacco.  Objective Findings:  Vitals: Blood pressure 100/80, pulse 80, height 5\' 4"  (1.626 m), weight 154 lb 12.8 oz (70.2 kg), last menstrual period 08/31/2017.  Physical Examination: General appearance - alert, well appearing, and in no distress and oriented to person, place, and time Mental status - alert, oriented to person, place, and time, normal mood, behavior, speech, dress, motor activity, and thought processes Abdomen - soft, nontender, nondistended, no masses or organomegaly Pelvic - VULVA: normal appearing vulva with no masses, tenderness or lesions, VAGINA: normal appearing vagina with normal color and discharge, no lesions, PELVIC FLOOR EXAM: no cystocele, rectocele or prolapse noted, CERVIX: normal appearing cervix without discharge or lesions, UTERUS: uterus is normal size, shape, consistency and nontender, ADNEXA: slight/minimal fullness (?) on right , not tender Extremities - peripheral pulses normal, no pedal edema, no clubbing or cyanosis   Assessment & Plan:   A:  1. Hx ov cysts. 2 minimal pelvic discomfort Plan'  Follow x 1 month, if pain returns, will obtain pelvic u/s. Pain calendar.

## 2017-09-22 ENCOUNTER — Ambulatory Visit: Payer: BLUE CROSS/BLUE SHIELD | Admitting: Orthopedic Surgery

## 2017-10-07 ENCOUNTER — Encounter: Payer: Self-pay | Admitting: Orthopedic Surgery

## 2017-10-07 ENCOUNTER — Ambulatory Visit: Payer: BLUE CROSS/BLUE SHIELD | Admitting: Orthopedic Surgery

## 2017-10-07 VITALS — BP 154/105 | HR 65 | Ht 64.0 in | Wt 156.0 lb

## 2017-10-07 DIAGNOSIS — M25512 Pain in left shoulder: Secondary | ICD-10-CM
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
## 2017-10-17 IMAGING — DX DG CHEST 2V
2 series · 2 of 2 positions shown · non-contrast
Comparison: Chest x-ray of 01/13/2014

CLINICAL DATA: Chest congestion, cough for 2 weeks, difficulty
breathing

EXAM:
CHEST  2 VIEW

[chest pa]
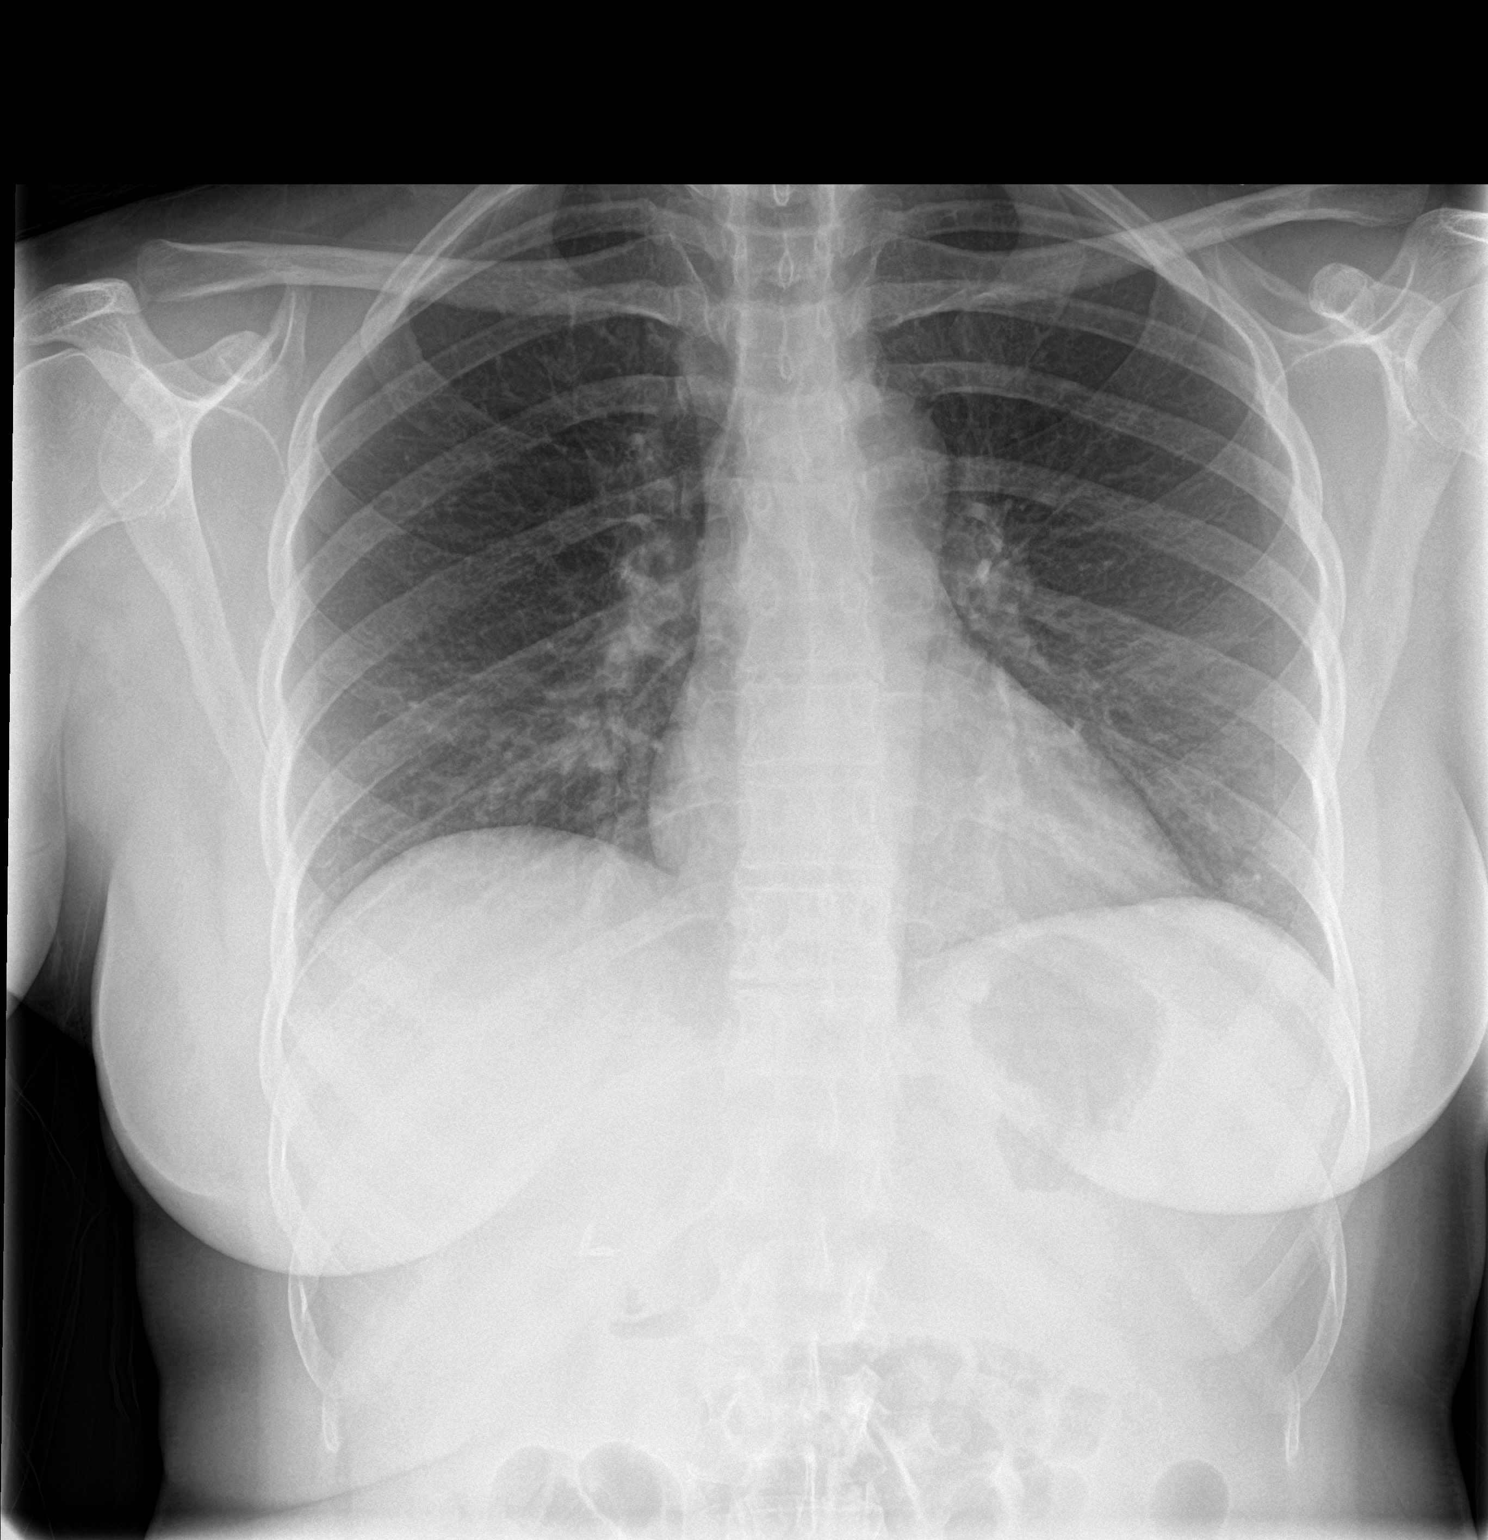

[chest lat]
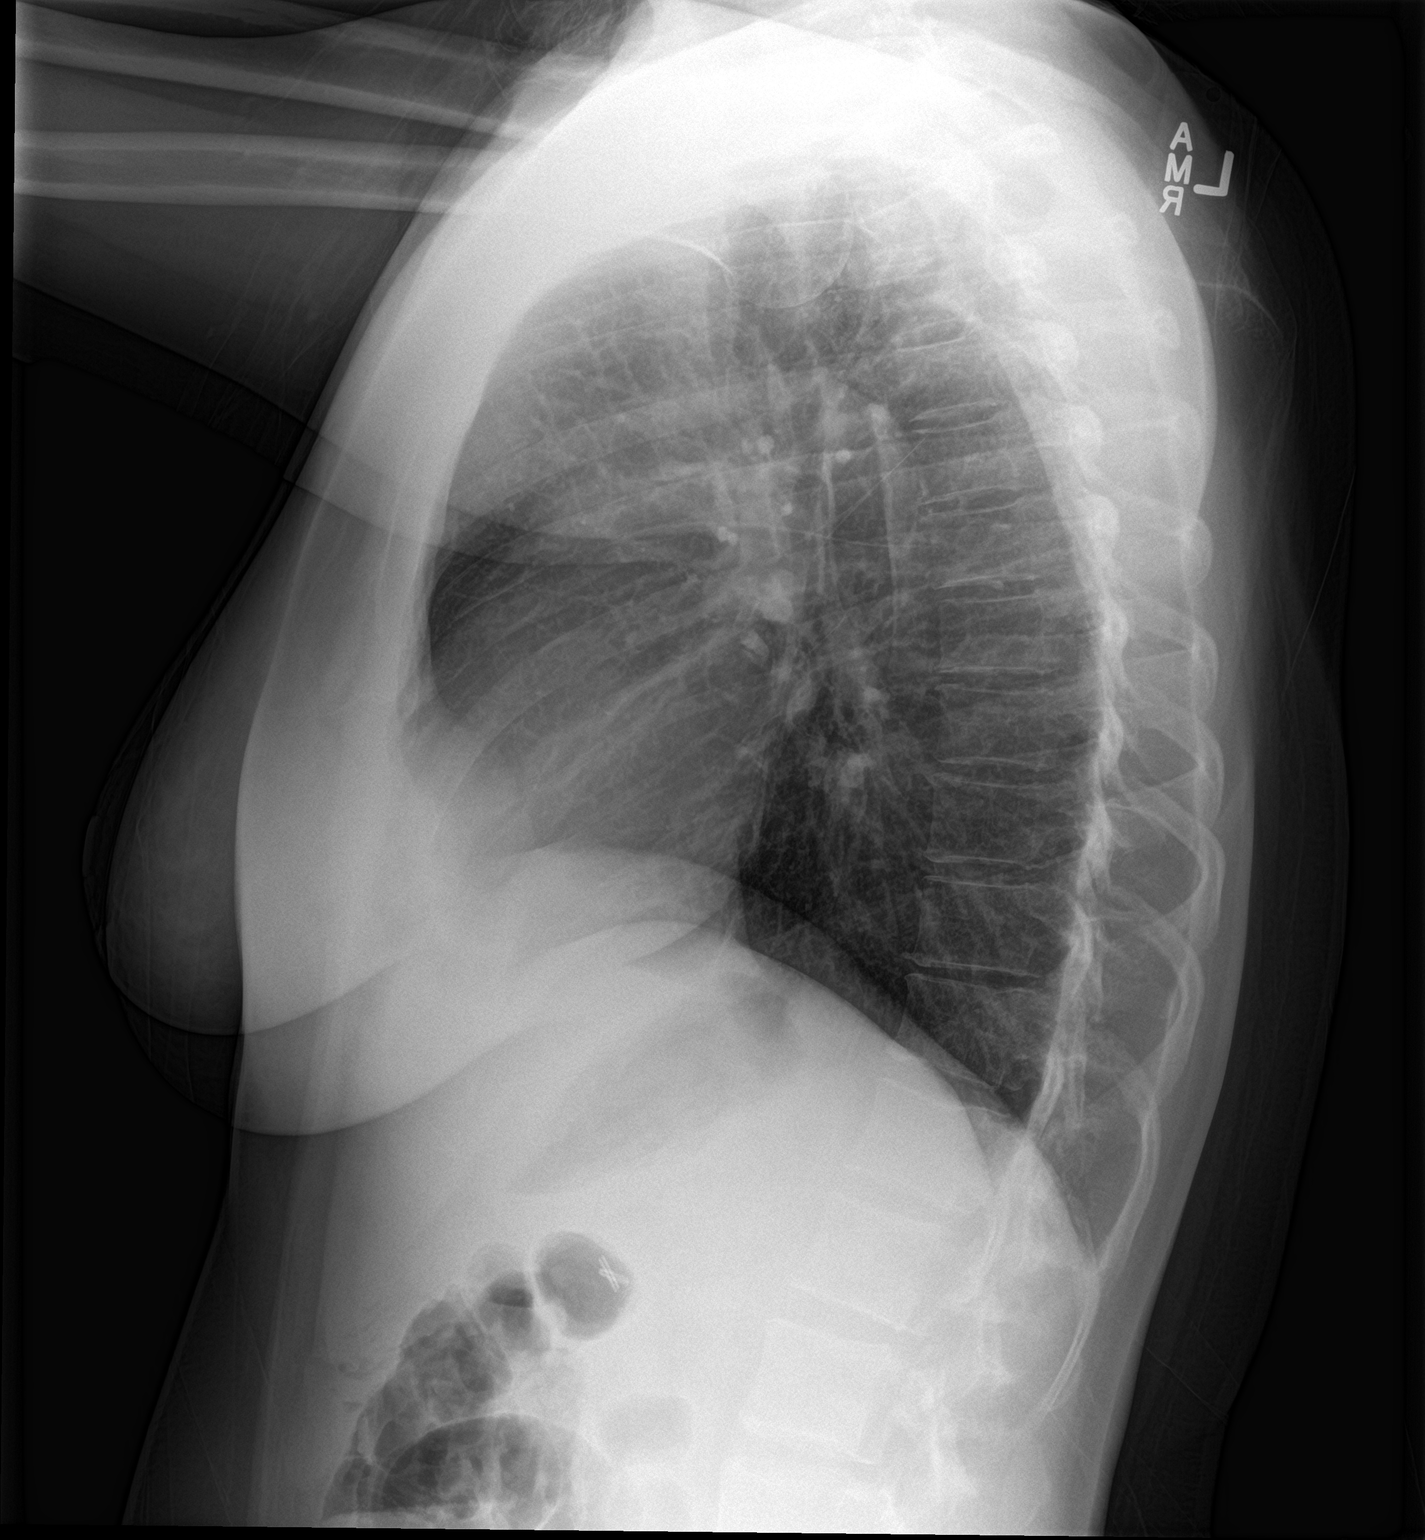

[2 of 2 positions shown; findings below may reference images not displayed]

FINDINGS: No active infiltrate or effusion is seen. Mediastinal and hilar
contours are unremarkable. The heart is within normal limits in
size. No bony abnormality is seen.
IMPRESSION: No active cardiopulmonary disease.

## 2017-10-19 ENCOUNTER — Ambulatory Visit: Payer: BLUE CROSS/BLUE SHIELD | Admitting: Obstetrics and Gynecology

## 2017-11-02 ENCOUNTER — Ambulatory Visit: Payer: BLUE CROSS/BLUE SHIELD | Admitting: Obstetrics and Gynecology

## 2017-11-04 ENCOUNTER — Ambulatory Visit: Payer: BLUE CROSS/BLUE SHIELD | Admitting: Orthopedic Surgery

## 2017-11-04 ENCOUNTER — Encounter: Payer: Self-pay | Admitting: Obstetrics and Gynecology

## 2017-11-04 ENCOUNTER — Encounter (INDEPENDENT_AMBULATORY_CARE_PROVIDER_SITE_OTHER): Payer: Self-pay

## 2017-11-04 ENCOUNTER — Ambulatory Visit: Payer: BLUE CROSS/BLUE SHIELD | Admitting: Obstetrics and Gynecology

## 2017-11-04 VITALS — BP 100/70 | HR 96 | Wt 153.4 lb

## 2017-11-04 DIAGNOSIS — Z3201 Encounter for pregnancy test, result positive: Secondary | ICD-10-CM | POA: Diagnosis not present

## 2017-11-04 DIAGNOSIS — R102 Pelvic and perineal pain: Secondary | ICD-10-CM | POA: Diagnosis not present

## 2017-11-04 LAB — POCT URINE PREGNANCY: Preg Test, Ur: POSITIVE — AB

## 2017-11-04 NOTE — Progress Notes (Signed)
Patient ID: Amber Russell, female   DOB: 04-Jul-1971, 47 y.o.   MRN: [id number]   [facility] Visit  @DATE @            Patient name: Amber Russell MRN [id number]  Date of birth: 02-27-1971  CC & HPI:  Charnele Semple is a 47 y.o. female presenting today for a follow-up of her pelvic pain. She was last seen 09/18/2017. She reports that she has not had a menstrual cycle this month. The patient had a positive pregnancy test, but her and her husband have decided that due to her age and health problems that it is not a good idea to continue with the pregnancy. She denies fever, chills or any other symptoms or complaints at this time.   ROS:  ROS +pelvic pain +pregnant -fever -chills All systems are negative except as noted in the HPI and PMH.   Pertinent History Reviewed:   Reviewed: pregnant Medical         Past Medical History:  Diagnosis Date  . Contraceptive management 02/15/2015  . GERD without esophagitis   . Hep C w/o coma, chronic ([place])   . Hx of migraines   . Nexplanon removal 02/15/2015                              Surgical Hx:    Past Surgical History:  Procedure Laterality Date  . CESAREAN SECTION    . CHOLECYSTECTOMY     Medications: Reviewed & Updated - see associated section                       Current Outpatient Medications:  .  ibuprofen (ADVIL,MOTRIN) 800 MG tablet, TAKE 1 TABLET(800 MG) BY MOUTH DAILY, Disp: 90 tablet, Rfl: 1 .  albuterol (VENTOLIN HFA) 108 (90 Base) MCG/ACT inhaler, Inhale 2 puffs into the lungs every 6 (six) hours as needed for wheezing or shortness of breath., Disp: , Rfl:  .  atenolol (TENORMIN) 25 MG tablet, Take 25 mg by mouth daily., Disp: , Rfl: 1 .  Azelastine-Fluticasone (DYMISTA) 137-50 MCG/ACT SUSP, Place 1 spray into both nostrils 2 (two) times daily. (Patient not taking: Reported on 03/03/2017), Disp: 1 Bottle, Rfl: 5 .  budesonide-formoterol (SYMBICORT) 160-4.5 MCG/ACT inhaler, Inhale 2 puffs into the lungs 2 (two) times  daily. Use with spacer Rinse Gargle and Spit after use (Patient not taking: Reported on 03/03/2017), Disp: 1 Inhaler, Rfl: 5 .  Dexlansoprazole (DEXILANT) 30 MG capsule, Take 1 capsule (30 mg total) by mouth daily. (Patient not taking: Reported on 03/03/2017), Disp: 30 capsule, Rfl: 5 .  fluticasone (FLONASE) 50 MCG/ACT nasal spray, Place 2 sprays into both nostrils daily., Disp: , Rfl:  .  hyoscyamine (LEVSIN, ANASPAZ) 0.125 MG tablet, Take 0.125 mg by mouth., Disp: , Rfl:  .  levocetirizine (XYZAL) 5 MG tablet, Take 1 tablet (5 mg total) by mouth every evening. (Patient not taking: Reported on 09/18/2017), Disp: 30 tablet, Rfl: 5 .  loratadine (CLARITIN) 10 MG tablet, Take 10 mg by mouth daily., Disp: , Rfl: 0 .  meclizine (ANTIVERT) 12.5 MG tablet, Take 12.5 mg by mouth as needed., Disp: , Rfl: 1 .  megestrol (MEGACE) 40 MG tablet, 3 tablets a day for 5 days, 2 tablets a day for 5 days then 1 tablet daily (Patient not taking: Reported on 03/03/2017), Disp: 45 tablet, Rfl: 3 .  methocarbamol (ROBAXIN) 500 MG tablet, Take  1 tablet (500 mg total) by mouth every 6 (six) hours as needed. (Patient not taking: Reported on 09/18/2017), Disp: 60 tablet, Rfl: 2 .  montelukast (SINGULAIR) 10 MG tablet, Take 10 mg by mouth at bedtime., Disp: , Rfl:  .  NORLYDA 0.35 MG tablet, Take 0.35 mg by mouth daily., Disp: , Rfl: 11 .  Olopatadine HCl (PAZEO) 0.7 % SOLN, Place 1 drop into both eyes daily. (Patient not taking: Reported on 03/03/2017), Disp: 1 Bottle, Rfl: 5 .  rizatriptan (MAXALT-MLT) 10 MG disintegrating tablet, Take 10 mg by mouth every 2 (two) hours as needed., Disp: , Rfl: 2 .  topiramate (TOPAMAX) 100 MG tablet, Take 100 mg by mouth daily., Disp: , Rfl: 0 .  traMADol (ULTRAM) 50 MG tablet, Take 1 tablet (50 mg total) by mouth every 6 (six) hours as needed (headache not helped by tylenol or motrin). (Patient not taking: Reported on 09/18/2017), Disp: 10 tablet, Rfl: 0  Current Facility-Administered Medications:   .  predniSONE (DELTASONE) tablet 10 mg, 10 mg, Oral, UD, Bobbitt, Sedalia Muta, MD   Social History: Reviewed -  reports that  has never smoked. she has never used smokeless tobacco.  Objective Findings:  Vitals: Blood pressure 100/70, pulse 96, weight 153 lb 6.4 oz (69.6 kg), last menstrual period 09/27/2017.  PHYSICAL EXAMINATION General appearance - alert, well appearing, and in no distress, oriented to person, place, and time and normal appearing weight Mental status - alert, oriented to person, place, and time, normal mood, behavior, speech, dress, motor activity, and thought processes, affect appropriate to mood  PELVIC DEFERRED   Assessment & Plan:   A:  1. Unplanned pregnancy  P:  1. Refer to [facility] in [place], given contacts. 2. F/u 6 weeks or PRN  By signing my name below, I, Margit Banda, attest that this documentation has been prepared under the direction and in the presence of Jonnie Kind, MD. Electronically Signed: Margit Banda, Medical Scribe. 11/04/17. 12:01 PM.  I personally performed the services described in this documentation, which was SCRIBED in my presence. The recorded information has been reviewed and considered accurate. It has been edited as necessary during review. Jonnie Kind, MD

## 2017-11-06 ENCOUNTER — Telehealth: Payer: Self-pay | Admitting: Obstetrics and Gynecology

## 2017-11-06 ENCOUNTER — Encounter: Payer: Self-pay | Admitting: Obstetrics and Gynecology

## 2017-11-06 ENCOUNTER — Ambulatory Visit: Payer: BLUE CROSS/BLUE SHIELD | Admitting: Obstetrics and Gynecology

## 2017-11-06 VITALS — BP 128/80 | HR 72 | Ht 64.0 in | Wt 153.6 lb

## 2017-11-06 DIAGNOSIS — Z331 Pregnant state, incidental: Secondary | ICD-10-CM | POA: Diagnosis not present

## 2017-11-06 DIAGNOSIS — Z3201 Encounter for pregnancy test, result positive: Secondary | ICD-10-CM | POA: Insufficient documentation

## 2017-11-06 DIAGNOSIS — O09511 Supervision of elderly primigravida, first trimester: Secondary | ICD-10-CM | POA: Insufficient documentation

## 2017-11-06 NOTE — Progress Notes (Signed)
[facility] Visit  11/06/2017            Patient name: Amber Russell MRN [id number]  Date of birth: 1971-06-16  CC & HPI:  Amber Russell is a 47 y.o. female presenting today for a follow up appointment to discuss genetic testing for her baby. She is unsure whether she wants to continue with the pregnancy. She is concerned her age will be a risk factor.  Down syndrome risk of 1-20 @ age 72 quoted to the patient, and discussed options of NIPS testing Dating u/s to be ordered She was last seen on 11/04/17 for f/u of pelvic pain and positive pregnancy test. She and her husband decided to terminate the pregnancy, and were given the contact info of [facility] in [place] but have reconsidered.  ROS:  ROS (+) pregnancy (-) fever (-) pain All systems are negative except as noted in the HPI and PMH.   Pertinent History Reviewed:   Reviewed: Significant for C/S, Hep C Medical         Past Medical History:  Diagnosis Date  . Contraceptive management 02/15/2015  . GERD without esophagitis   . Hep C w/o coma, chronic ([place])   . Hx of migraines   . Nexplanon removal 02/15/2015                              Surgical Hx:    Past Surgical History:  Procedure Laterality Date  . CESAREAN SECTION    . CHOLECYSTECTOMY     Medications: Reviewed & Updated - see associated section                       Current Outpatient Medications:  .  albuterol (VENTOLIN HFA) 108 (90 Base) MCG/ACT inhaler, Inhale 2 puffs into the lungs every 6 (six) hours as needed for wheezing or shortness of breath., Disp: , Rfl:   Current Facility-Administered Medications:  .  predniSONE (DELTASONE) tablet 10 mg, 10 mg, Oral, UD, Bobbitt, Sedalia Muta, MD   Social History: Reviewed -  reports that  has never smoked. she has never used smokeless tobacco.  Objective Findings:  Vitals: Blood pressure 128/80, pulse 72, height 5\' 4"  (1.626 m), weight 153 lb 9.6 oz (69.7 kg), last menstrual period  09/27/2017.  PHYSICAL EXAMINATION General appearance - alert, well appearing, and in no distress, oriented to person, place, and time and normal appearing weight Mental status - alert, oriented to person, place, and time, normal mood, behavior, speech, dress, motor activity, and thought processes  PELVIC Not indicated  Discussion: 1. Discussed with pt risks and benefits of continuing with her pregnancy at advanced maternal age. Age related Downs Syndrome statistics discussed.  At end of discussion, pt had opportunity to ask questions and has no further questions at this time.   Specific discussion as noted above. Greater than 50% was spent in counseling and coordination of care with the patient.   Total time greater than: 25 minutes.    Assessment & Plan:   A:  1.  Unplanned pregnancy, AMA 2.  Concern over genetic risk of pregnancy  P:  1. Schedule a dating U/S for Monday, gyn f/u 2. Cell-free fetal DNA testing to follow   By signing my name below, I, Izna Ahmed, attest that this documentation has been prepared under the direction and in the presence of Jonnie Kind, MD. Electronically Signed: Joni Reining  Ahmed, Medical Scribe. 11/06/17. 11:25 AM.  I personally performed the services described in this documentation, which was SCRIBED in my presence. The recorded information has been reviewed and considered accurate. It has been edited as necessary during review. Jonnie Kind, MD

## 2017-11-09 ENCOUNTER — Other Ambulatory Visit: Payer: BLUE CROSS/BLUE SHIELD

## 2017-11-10 ENCOUNTER — Other Ambulatory Visit: Payer: Self-pay | Admitting: Obstetrics and Gynecology

## 2017-11-10 DIAGNOSIS — O3680X0 Pregnancy with inconclusive fetal viability, not applicable or unspecified: Secondary | ICD-10-CM

## 2017-11-11 ENCOUNTER — Ambulatory Visit: Payer: BLUE CROSS/BLUE SHIELD | Admitting: Obstetrics and Gynecology

## 2017-11-11 ENCOUNTER — Other Ambulatory Visit: Payer: BLUE CROSS/BLUE SHIELD

## 2017-11-18 ENCOUNTER — Other Ambulatory Visit: Payer: BLUE CROSS/BLUE SHIELD

## 2017-11-18 ENCOUNTER — Telehealth: Payer: Self-pay | Admitting: *Deleted

## 2017-11-18 ENCOUNTER — Ambulatory Visit (INDEPENDENT_AMBULATORY_CARE_PROVIDER_SITE_OTHER): Payer: BLUE CROSS/BLUE SHIELD

## 2017-11-18 ENCOUNTER — Other Ambulatory Visit: Payer: Self-pay | Admitting: Obstetrics and Gynecology

## 2017-11-18 DIAGNOSIS — O3411 Maternal care for benign tumor of corpus uteri, first trimester: Secondary | ICD-10-CM

## 2017-11-18 DIAGNOSIS — D251 Intramural leiomyoma of uterus: Secondary | ICD-10-CM

## 2017-11-18 DIAGNOSIS — O209 Hemorrhage in early pregnancy, unspecified: Secondary | ICD-10-CM

## 2017-11-18 DIAGNOSIS — O208 Other hemorrhage in early pregnancy: Secondary | ICD-10-CM

## 2017-11-18 DIAGNOSIS — Z3A01 Less than 8 weeks gestation of pregnancy: Secondary | ICD-10-CM

## 2017-11-18 DIAGNOSIS — O3680X0 Pregnancy with inconclusive fetal viability, not applicable or unspecified: Secondary | ICD-10-CM

## 2017-11-18 DIAGNOSIS — N8311 Corpus luteum cyst of right ovary: Secondary | ICD-10-CM | POA: Diagnosis not present

## 2017-11-18 DIAGNOSIS — O200 Threatened abortion: Secondary | ICD-10-CM

## 2017-11-18 NOTE — Telephone Encounter (Signed)
Patient states she started having cramping and bleeding last night and is still bleeding this morning. She is wanting to be seen to see if she is having a miscarriage as she is going Friday to terminate the pregnancy but doesn't want to go if she is indeed having a miscarriage. Pt sees Dr Glo Herring but informed he is out of the office today and we only have 2 providers in the office. Will discuss with Anderson Malta and get back with patient. Please advise.

## 2017-11-18 NOTE — Progress Notes (Signed)
[name] 5+5 wks,single IUP,positive fht 101 bpm,crl 2.3 mm,simple right corpus luteal cyst 2.6 x 2.1 x 2.2 cm,normal left ovary,mult small intramural fibroids (#1) fundal right 1.4 x .8 x 1.5 cm (#2)mid left 1.2 x 1.32 x .9 cm,pt would like to terminate pregnancy,she is having vaginal bleeding today,will get labs today and next wk,per Anderson Malta

## 2017-11-18 NOTE — Telephone Encounter (Signed)
Informed patient that I spoke to [place] and we just need her to come in for a dating u/s. Pt verbalized understanding and will come at 11:30 today.

## 2017-11-19 LAB — BETA HCG QUANT (REF LAB): hCG Quant: 813 m[IU]/mL

## 2017-11-19 NOTE — Telephone Encounter (Signed)
Informed patient of quant and need to return next week on 3/13. Pt with no questions.

## 2017-11-24 ENCOUNTER — Ambulatory Visit: Payer: BLUE CROSS/BLUE SHIELD | Admitting: Obstetrics and Gynecology

## 2017-11-24 ENCOUNTER — Encounter: Payer: Self-pay | Admitting: Obstetrics and Gynecology

## 2017-11-24 DIAGNOSIS — Z3009 Encounter for other general counseling and advice on contraception: Secondary | ICD-10-CM

## 2017-11-24 DIAGNOSIS — Z3202 Encounter for pregnancy test, result negative: Secondary | ICD-10-CM

## 2017-11-24 LAB — POCT URINE PREGNANCY: Preg Test, Ur: NEGATIVE

## 2017-11-24 NOTE — Progress Notes (Signed)
   [facility] Visit  @DATE @            Patient name: Amber Russell MRN [id number]  Date of birth: 02/28/1971  CC & HPI:  Amber Russell is a 47 y.o. female presenting today for contraceptive counseling.  She is recently status post spontaneous miscarriage.  She had a quantitative hCG of 139 last Friday after bleeding cramping and passage of tissue last Wednesday.  Urine pregnancy test is negative today.  We discussed contraception options and she desires an IUD we will seek approval by [facility]  ROS:  ROS prior history of Mirena IUD use successfully   Pertinent History Reviewed:   Reviewed: Significant for prior IUD use Medical         Past Medical History:  Diagnosis Date  . Contraceptive management 02/15/2015  . GERD without esophagitis   . Hep C w/o coma, chronic ([place])   . Hx of migraines   . Nexplanon removal 02/15/2015                              Surgical Hx:    Past Surgical History:  Procedure Laterality Date  . CESAREAN SECTION    . CHOLECYSTECTOMY     Medications: Reviewed & Updated - see associated section                       Current Outpatient Medications:  .  albuterol (VENTOLIN HFA) 108 (90 Base) MCG/ACT inhaler, Inhale 2 puffs into the lungs every 6 (six) hours as needed for wheezing or shortness of breath., Disp: , Rfl:   Current Facility-Administered Medications:  .  predniSONE (DELTASONE) tablet 10 mg, 10 mg, Oral, UD, Bobbitt, Sedalia Muta, MD   Social History: Reviewed -  reports that  has never smoked. she has never used smokeless tobacco.  Objective Findings:  Vitals: Blood pressure 100/80, pulse 99, weight 154 lb 9.6 oz (70.1 kg), last menstrual period 09/27/2017.  PHYSICAL EXAMINATION General appearance - alert, well appearing, and in no distress, oriented to person, place, and time and normal appearing weight Mental status - alert, oriented to person, place, and time Chest -  Heart -  Abdomen - soft, nontender,  nondistended, no masses or organomegaly Breasts -  Skin -   P  Assessment & Plan:   A:  1. Spontaneous miscarriage completed 2. Contraception counseling desires Mirena IUD  P:  1. We will obtain approval from insurance company and schedule IUD placement in a.m. has appointment 10:30 AM

## 2017-11-25 ENCOUNTER — Other Ambulatory Visit: Payer: BLUE CROSS/BLUE SHIELD

## 2017-11-25 ENCOUNTER — Ambulatory Visit: Payer: BLUE CROSS/BLUE SHIELD | Admitting: Obstetrics and Gynecology

## 2017-11-25 ENCOUNTER — Encounter: Payer: Self-pay | Admitting: Obstetrics and Gynecology

## 2017-11-25 DIAGNOSIS — Z3202 Encounter for pregnancy test, result negative: Secondary | ICD-10-CM

## 2017-11-25 NOTE — Progress Notes (Signed)
[facility] PROCEDURE NOTE- IUD INSERT  Amber Russell is a 47 y.o. [phone] here for ([id number])Mirena  IUD insertion  No GYN concerns.   Last pap smear was on 08/07/2015  and was normal.  GC/CHL negative  IUD Insertion Procedure Note Patient identified, informed consent performed, consent signed.   Discussed risks of irregular bleeding, cramping, infection, malpositioning or misplacement of the IUD outside the uterus which may require further procedure such as laparoscopy. Time out was performed.  Urine pregnancy test negative.  Speculum inserted, Cervix visualized.  Cleaned with Betadine x 2.   Grasped anteriorly with a single tooth tenaculum.  Uterus was not sounded.  Tenaculum was removed. Patient did not tolerate procedure, and visit was cancelled.  Pt advised to return when she starts her period, and take an ibuprofen before her visit.   By signing my name below, I, Izna Ahmed, attest that this documentation has been prepared under the direction and in the presence of Jonnie Kind, MD. Electronically Signed: Jabier Gauss, Medical Scribe. 11/25/17. 12:01 PM.     I personally performed the services described in this documentation, which was SCRIBED in my presence. The recorded information has been reviewed and considered accurate. It has been edited as necessary during review. Jonnie Kind, MD

## 2017-12-16 ENCOUNTER — Ambulatory Visit (INDEPENDENT_AMBULATORY_CARE_PROVIDER_SITE_OTHER): Payer: BLUE CROSS/BLUE SHIELD | Admitting: Obstetrics and Gynecology

## 2017-12-16 ENCOUNTER — Encounter: Payer: Self-pay | Admitting: Obstetrics and Gynecology

## 2017-12-16 ENCOUNTER — Ambulatory Visit: Payer: BLUE CROSS/BLUE SHIELD | Admitting: Orthopedic Surgery

## 2017-12-16 ENCOUNTER — Encounter: Payer: Self-pay | Admitting: Orthopedic Surgery

## 2017-12-16 ENCOUNTER — Ambulatory Visit (INDEPENDENT_AMBULATORY_CARE_PROVIDER_SITE_OTHER): Payer: BLUE CROSS/BLUE SHIELD

## 2017-12-16 VITALS — BP 132/79 | HR 88 | Ht 64.0 in | Wt 152.0 lb

## 2017-12-16 VITALS — BP 110/54 | HR 80 | Ht 64.0 in | Wt 151.2 lb

## 2017-12-16 DIAGNOSIS — M25512 Pain in left shoulder: Secondary | ICD-10-CM

## 2017-12-16 DIAGNOSIS — M542 Cervicalgia: Secondary | ICD-10-CM | POA: Diagnosis not present

## 2017-12-16 DIAGNOSIS — M4722 Other spondylosis with radiculopathy, cervical region: Secondary | ICD-10-CM | POA: Diagnosis not present

## 2017-12-16 DIAGNOSIS — Z308 Encounter for other contraceptive management: Secondary | ICD-10-CM

## 2017-12-16 MED ORDER — PREDNISONE 10 MG PO TABS: 10.0000 mg | ORAL_TABLET | Freq: Every day | ORAL | 0 refills | Status: DC

## 2017-12-16 MED ORDER — GABAPENTIN 100 MG PO CAPS: 100.0000 mg | ORAL_CAPSULE | Freq: Every day | ORAL | 2 refills | Status: DC

## 2017-12-16 NOTE — Progress Notes (Signed)
Progress Note   Patient ID: Amber Russell, female   DOB: 1970-10-11, 47 y.o.   MRN: [id number]  Chief Complaint  Patient presents with  . Arm Pain    pain down left arm/ has some neck pain as well     47 year old female previously seen for trigger point presents with cervical spine pain radiating into the left arm with some weakness of the left upper extremity for approximately 4-6 weeks no prior treatment pain is dull aching no associated loss of motion the cervical spine    Review of Systems  Constitutional: Negative.   Musculoskeletal: Positive for neck pain. Negative for myalgias.  Skin: Negative.   Neurological: Positive for weakness. Negative for tingling, tremors and sensory change.   No outpatient medications have been marked as taking for the 12/16/17 encounter (Office Visit) with Carole Civil, MD.   Current Facility-Administered Medications for the 12/16/17 encounter (Office Visit) with Carole Civil, MD  Medication  . predniSONE (DELTASONE) tablet 10 mg    No Known Allergies   BP 132/79   Pulse 88   Ht 5\' 4"  (1.626 m)   Wt 152 lb (68.9 kg)   LMP 09/27/2017 (Exact Date)   Breastfeeding? Unknown   BMI 26.09 kg/m   Physical Exam  Constitutional: She is oriented to person, place, and time. She appears well-developed and well-nourished.  Neurological: She is alert and oriented to person, place, and time.  Psychiatric: She has a normal mood and affect. Judgment normal.  Vitals reviewed.  Tenderness at the base of the cervical spine but normal rotation decreased flexion with pain at the base of the spine as well  Left and right upper extremities show normal alignment.  Normal range of motion.  Normal stability shoulder elbow wrist.  Right upper extremity strength normal right arm  Weakness in the left arm with flexion and extension of the elbow hand normal  Skin over the cervical spine on the left and right arm normal  Distal pulses are intact  bilaterally there is no lymphadenopathy in the axilla or supraclavicular region  There are no sensory deficits and reflexes were 2+ and equal bilaterally  Medical decision-making  X-rays: AP lateral cervical spine  Neck pain with radiation into the arm  On the AP x-ray in the middle cervical segments of C4 through 7 uncovertebral joint arthrosis is seen  neck alignment normal  On lateral x-ray mild loss of normal cervical lordosis but disc spaces are well-maintained  C7-T1 facet joint looks to be arthritic  Impression cervical spondylosis primarily C4 through 7 with facet arthritis at C7-T1   Encounter Diagnoses  Name Primary?  . Trigger point of left shoulder region   . Neck pain   . Cervical spondylosis with radiculopathy Yes      Meds ordered this encounter  Medications  . predniSONE (DELTASONE) 10 MG tablet    Sig: Take 1 tablet (10 mg total) by mouth daily.    Dispense:  60 tablet    Refill:  0  . gabapentin (NEURONTIN) 100 MG capsule    Sig: Take 1 capsule (100 mg total) by mouth at bedtime.    Dispense:  90 capsule    Refill:  2   Medications recommended for 6 weeks if no improvement MRI would be necessary to evaluate the cervical spine more for possible disc impingement herniation or protrusion  Arther Abbott, MD 12/16/2017 9:23 AM

## 2017-12-16 NOTE — Progress Notes (Signed)
Patient ID: Amber Russell, female   DOB: 30-Jan-1971, 47 y.o.   MRN: [id number]  I explained to [name] the importance of coming in when she is on her menstrual cycle in order for me to insert the IUD. There was a misunderstanding at the last visit and she now understands. She will call when she is on her menstrual cycle and we will  fit her in for an IUD insertion before her cycle is over.   By signing my name below, I, Margit Banda, attest that this documentation has been prepared under the direction and in the presence of Jonnie Kind, MD. Electronically Signed: Margit Banda, Medical Scribe. 12/16/17. 11:11 AM.  I personally performed the services described in this documentation, which was SCRIBED in my presence. The recorded information has been reviewed and considered accurate. It has been edited as necessary during review. Jonnie Kind, MD

## 2017-12-18 ENCOUNTER — Ambulatory Visit: Payer: BLUE CROSS/BLUE SHIELD | Admitting: Orthopedic Surgery

## 2017-12-23 ENCOUNTER — Encounter: Payer: Self-pay | Admitting: Obstetrics and Gynecology

## 2017-12-23 ENCOUNTER — Ambulatory Visit: Payer: BLUE CROSS/BLUE SHIELD | Admitting: Obstetrics and Gynecology

## 2017-12-23 VITALS — BP 110/80 | HR 98 | Wt 150.6 lb

## 2017-12-23 DIAGNOSIS — Z3202 Encounter for pregnancy test, result negative: Secondary | ICD-10-CM

## 2017-12-23 DIAGNOSIS — Z30430 Encounter for insertion of intrauterine contraceptive device: Secondary | ICD-10-CM | POA: Diagnosis not present

## 2017-12-23 LAB — POCT URINE PREGNANCY: Preg Test, Ur: NEGATIVE

## 2017-12-23 MED ORDER — LEVONORGESTREL 20 MCG/24HR IU IUD
INTRAUTERINE_SYSTEM | Freq: Once | INTRAUTERINE | Status: AC
Start: 2017-12-23 — End: 2017-12-23
  Administered 2017-12-23: 14:00:00 via INTRAUTERINE

## 2017-12-23 NOTE — Addendum Note (Signed)
Addended by: Diona Fanti A on: 12/23/2017 02:08 PM   Modules accepted: Orders

## 2017-12-23 NOTE — Progress Notes (Signed)
[facility] CL[facility] PROCEDURE NOTE- IUD INSERT  Amber Russell is a 48 y.o. [phone] here for ([id number])Mirena  IUD insertion  No GYN concerns.  She is on the first day of her period. She took an ibuprofen about an hour ago. Last pap smear was on 08/12/17  and was normal.  GC/CHL negative, 12/20/14  IUD Insertion Procedure Note Patient identified, informed consent performed, consent signed.   Discussed risks of irregular bleeding, cramping, infection, malpositioning or misplacement of the IUD outside the uterus which may require further procedure such as laparoscopy. Time out was performed.  Urine pregnancy test negative.  Speculum inserted, Cervix visualized and numbed.  Cleaned with Betadine x 2.  Paracervical and intracervical injection x 10 cc lidocaine done at 12 oclock on cervix, then at 4 and 8 clock Grasped anteriorly with a single tooth tenaculum.  Uterus sounded to 9 cm.  Mirena IUD placed per manufacturer's recommendations.   Strings trimmed to 3 cm.  Tenaculum was removed, good hemostasis noted.  Patient tolerated procedure well.  TV u/s used to confirm a satisfactory position of the IUD Patient was given post-procedure instructions.  She was advised to have backup contraception for one week.  Patient was also asked to check IUD strings periodically and follow up in 4 weeks for IUD check.   By signing my name below, I, Izna Ahmed, attest that this documentation has been prepared under the direction and in the presence of Jonnie Kind, MD. Electronically Signed: Jabier Gauss, Medical Scribe. 12/23/17. 1:21 PM.  I personally performed the services described in this documentation, which was SCRIBED in my presence. The recorded information has been reviewed and considered accurate. It has been edited as necessary during review. Jonnie Kind, MD

## 2018-01-22 ENCOUNTER — Other Ambulatory Visit: Payer: Self-pay

## 2018-01-22 ENCOUNTER — Encounter (HOSPITAL_COMMUNITY): Payer: Self-pay | Admitting: Emergency Medicine

## 2018-01-22 ENCOUNTER — Emergency Department (HOSPITAL_COMMUNITY): Payer: BLUE CROSS/BLUE SHIELD

## 2018-01-22 ENCOUNTER — Emergency Department (HOSPITAL_COMMUNITY)
Admission: EM | Admit: 2018-01-22 | Discharge: 2018-01-22 | Disposition: A | Discharge status: Home / Self Care | Payer: BLUE CROSS/BLUE SHIELD | Attending: Emergency Medicine | Admitting: Emergency Medicine

## 2018-01-22 DIAGNOSIS — G43901 Migraine, unspecified, not intractable, with status migrainosus: Secondary | ICD-10-CM | POA: Diagnosis not present

## 2018-01-22 DIAGNOSIS — J4540 Moderate persistent asthma, uncomplicated: Secondary | ICD-10-CM | POA: Diagnosis not present

## 2018-01-22 DIAGNOSIS — Z79899 Other long term (current) drug therapy: Secondary | ICD-10-CM | POA: Insufficient documentation

## 2018-01-22 DIAGNOSIS — R51 Headache: Secondary | ICD-10-CM | POA: Diagnosis present

## 2018-01-22 LAB — CBC WITH DIFFERENTIAL/PLATELET
Basophils Absolute: 0 10*3/uL (ref 0.0–0.1)
Basophils Relative: 1 %
Eosinophils Absolute: 0.2 10*3/uL (ref 0.0–0.7)
Eosinophils Relative: 4 %
HCT: 40 % (ref 36.0–46.0)
Hemoglobin: 13.1 g/dL (ref 12.0–15.0)
Lymphocytes Relative: 31 %
Lymphs Abs: 1.7 10*3/uL (ref 0.7–4.0)
MCH: 28.2 pg (ref 26.0–34.0)
MCHC: 32.8 g/dL (ref 30.0–36.0)
MCV: 86 fL (ref 78.0–100.0)
Monocytes Absolute: 0.5 10*3/uL (ref 0.1–1.0)
Monocytes Relative: 9 %
Neutro Abs: 3 10*3/uL (ref 1.7–7.7)
Neutrophils Relative %: 55 %
Platelets: 167 10*3/uL (ref 150–400)
RBC: 4.65 MIL/uL (ref 3.87–5.11)
RDW: 14.8 % (ref 11.5–15.5)
WBC: 5.5 10*3/uL (ref 4.0–10.5)

## 2018-01-22 LAB — BASIC METABOLIC PANEL
Anion gap: 8 (ref 5–15)
BUN: 13 mg/dL (ref 6–20)
CO2: 25 mmol/L (ref 22–32)
Calcium: 9.1 mg/dL (ref 8.9–10.3)
Chloride: 108 mmol/L (ref 101–111)
Creatinine, Ser: 0.65 mg/dL (ref 0.44–1.00)
GFR calc Af Amer: >60 mL/min (ref >60)
GFR calc non Af Amer: >60 mL/min (ref >60)
Glucose, Bld: 92 mg/dL (ref 65–99)
Potassium: 3.7 mmol/L (ref 3.5–5.1)
Sodium: 141 mmol/L (ref 135–145)

## 2018-01-22 MED ORDER — KETOROLAC TROMETHAMINE 30 MG/ML IJ SOLN
30.0000 mg | Freq: Once | INTRAMUSCULAR | Status: AC
  Administered 2018-01-22: 30 mg via INTRAVENOUS
  Filled 2018-01-22: qty 1

## 2018-01-22 MED ORDER — DEXAMETHASONE SODIUM PHOSPHATE 10 MG/ML IJ SOLN
10.0000 mg | Freq: Once | INTRAMUSCULAR | Status: AC
  Administered 2018-01-22: 10 mg via INTRAVENOUS
  Filled 2018-01-22: qty 1

## 2018-01-22 MED ORDER — DIPHENHYDRAMINE HCL 50 MG/ML IJ SOLN
25.0000 mg | Freq: Once | INTRAMUSCULAR | Status: AC
  Administered 2018-01-22: 25 mg via INTRAVENOUS
  Filled 2018-01-22: qty 1

## 2018-01-22 MED ORDER — SODIUM CHLORIDE 0.9 % IV BOLUS
1000.0000 mL | Freq: Once | INTRAVENOUS | Status: AC
  Administered 2018-01-22: 1000 mL via INTRAVENOUS

## 2018-01-22 MED ORDER — PROCHLORPERAZINE EDISYLATE 10 MG/2ML IJ SOLN
10.0000 mg | Freq: Once | INTRAMUSCULAR | Status: AC
  Administered 2018-01-22: 10 mg via INTRAVENOUS
  Filled 2018-01-22: qty 2

## 2018-01-22 NOTE — ED Provider Notes (Signed)
[facility] EMERGENCY DEPARTMENT Provider Note   CSN: [id number] Arrival date & time: 01/22/18  0458     History   Chief Complaint Chief Complaint  Patient presents with  . Migraine    HPI Amber Russell is a 47 y.o. female.  Patient presents to the emergency department for evaluation of headache.  She has a history of recurrent migraine headache.  This headache began 1 week ago.  It started as a left-sided headache, has become global.  She has had nausea, vomiting as well as light sensitivity.  Patient was seen by her doctor, given an injection and then started on residual stent.  She reports that she has used the rizatriptan, but that has caused her to feel like her head is numb at times and both of her arms sometimes feel numb.  She feels dizzy and off-balance.     Past Medical History:  Diagnosis Date  . Contraceptive management 02/15/2015  . GERD without esophagitis   . Hep C w/o coma, chronic ([place])   . Hx of migraines   . Nexplanon removal 02/15/2015    Patient Active Problem List   Diagnosis Date Noted  . Pregnancy test positive 11/06/2017  . AMA (advanced maternal age) primigravida 37+, first trimester 11/06/2017  . Perennial and seasonal allergic rhinitis 01/29/2016  . Seasonal allergic conjunctivitis 01/29/2016  . Moderate persistent asthma with mild exacerbation 01/29/2016  . Nexplanon removal 02/15/2015  . Contraceptive management 02/15/2015  . DUB (dysfunctional uterine bleeding) 06/10/2013    Past Surgical History:  Procedure Laterality Date  . CESAREAN SECTION    . CHOLECYSTECTOMY       OB History    Gravida  4   Para  3   Term  3   Preterm      AB      Living  3     SAB      TAB      Ectopic      Multiple      Live Births               Home Medications    Prior to Admission medications   Medication Sig Start Date End Date Taking? Authorizing Provider  albuterol (VENTOLIN HFA) 108 (90 Base) MCG/ACT inhaler Inhale 2  puffs into the lungs every 6 (six) hours as needed for wheezing or shortness of breath.    [provider]  gabapentin (NEURONTIN) 100 MG capsule Take 1 capsule (100 mg total) by mouth at bedtime. Patient not taking: Reported on 12/16/2017 12/16/17   Carole Civil, MD  predniSONE (DELTASONE) 10 MG tablet Take 1 tablet (10 mg total) by mouth daily. Patient not taking: Reported on 12/16/2017 12/16/17   Carole Civil, MD    Family History Family History  Problem Relation Age of Onset  . Allergic rhinitis Son   . Asthma Son     Social History Social History   Tobacco Use  . Smoking status: Never Smoker  . Smokeless tobacco: Never Used  Substance Use Topics  . Alcohol use: No  . Drug use: No     Allergies   Patient has no known allergies.   Review of Systems Review of Systems  Neurological: Positive for dizziness, numbness and headaches.  All other systems reviewed and are negative.    Physical Exam Updated Vital Signs BP (!) 141/81   Pulse 86   Temp 97.6 F (36.4 C) (Oral)   Resp 20   Ht 5'  3" (1.6 m)   Wt 68 kg (150 lb)   LMP 12/23/2017   SpO2 98%   BMI 26.57 kg/m   Physical Exam  Constitutional: She is oriented to person, place, and time. She appears well-developed and well-nourished. No distress.  HENT:  Head: Normocephalic and atraumatic.  Right Ear: Hearing normal.  Left Ear: Hearing normal.  Nose: Nose normal.  Mouth/Throat: Oropharynx is clear and moist and mucous membranes are normal.  Eyes: Pupils are equal, round, and reactive to light. Conjunctivae and EOM are normal.  Neck: Normal range of motion. Neck supple.  Cardiovascular: Regular rhythm, S1 normal and S2 normal. Exam reveals no gallop and no friction rub.  No murmur heard. Pulmonary/Chest: Effort normal and breath sounds normal. No respiratory distress. She exhibits no tenderness.  Abdominal: Soft. Normal appearance and bowel sounds are normal. There is no hepatosplenomegaly.  There is no tenderness. There is no rebound, no guarding, no tenderness at McBurney's point and negative Murphy's sign. No hernia.  Musculoskeletal: Normal range of motion.  Neurological: She is alert and oriented to person, place, and time. She has normal strength. No cranial nerve deficit or sensory deficit. Coordination normal. GCS eye subscore is 4. GCS verbal subscore is 5. GCS motor subscore is 6.  Extraocular muscle movement: normal No visual field cut Pupils: equal and reactive both direct and consensual response is normal No nystagmus present    Sensory function is intact to light touch, pinprick Proprioception intact  Grip strength 5/5 symmetric in upper extremities No pronator drift Normal finger to nose bilaterally  Lower extremity strength 5/5 against gravity Normal heel to shin bilaterally  Gait: normal   Skin: Skin is warm, dry and intact. No rash noted. No cyanosis.  Psychiatric: She has a normal mood and affect. Her speech is normal and behavior is normal. Thought content normal.  Nursing note and vitals reviewed.    ED Treatments / Results  Labs (all labs ordered are listed, but only abnormal results are displayed) Labs Reviewed  CBC WITH DIFFERENTIAL/PLATELET  BASIC METABOLIC PANEL    EKG None  Radiology No results found.  Procedures Procedures (including critical care time)  Medications Ordered in ED Medications  sodium chloride 0.9 % bolus 1,000 mL (1,000 mLs Intravenous New Bag/Given 01/22/18 0546)  dexamethasone (DECADRON) injection 10 mg (10 mg Intravenous Given 01/22/18 0549)  diphenhydrAMINE (BENADRYL) injection 25 mg (25 mg Intravenous Given 01/22/18 0548)  prochlorperazine (COMPAZINE) injection 10 mg (10 mg Intravenous Given 01/22/18 0546)  ketorolac (TORADOL) 30 MG/ML injection 30 mg (30 mg Intravenous Given 01/22/18 0601)     Initial Impression / Assessment and Plan / ED Course  I have reviewed the triage vital signs and the nursing  notes.  Pertinent labs & imaging results that were available during my care of the patient were reviewed by me and considered in my medical decision making (see chart for details).     Patient presents to the emergency department for evaluation of migraine headache.  She has had a headache for 1 week.  She visited her doctor and was started on [place].  She has had some symptoms of dizziness, numbness and tingling after starting the Maxalt.  It has not helped her headache.  Because of these new symptoms, although her neurologic examination is normal, CT scan was performed to further evaluate.  No abnormality noted.  Patient administered IV fluids, Toradol, Compazine, Decadron, Benadryl for treatment of her headache.  She has had resolution of her migraine.  Final Clinical Impressions(s) / ED Diagnoses   Final diagnoses:  Migraine with status migrainosus, not intractable, unspecified migraine type    ED Discharge Orders    None       Orpah Greek, MD 01/22/18 [phone]

## 2018-01-22 NOTE — ED Triage Notes (Signed)
Pt c/o migraine x 1 week that will decrease in intensity, pt reports left sided headache at times and generalized at times, pt went to primary and was prescribed rizatriptan benzoate 10mg -pt reports she has taken 2 doses and since taking it her head feels full and bilateral arm numbness, Ibuprofen also prescribed 1 dose taken 1 hour ago

## 2018-01-25 ENCOUNTER — Ambulatory Visit: Payer: BLUE CROSS/BLUE SHIELD | Admitting: Obstetrics and Gynecology

## 2018-01-29 ENCOUNTER — Encounter: Payer: Self-pay | Admitting: Obstetrics and Gynecology

## 2018-01-29 ENCOUNTER — Ambulatory Visit: Payer: BLUE CROSS/BLUE SHIELD | Admitting: Obstetrics and Gynecology

## 2018-01-29 ENCOUNTER — Other Ambulatory Visit: Payer: Self-pay

## 2018-01-29 VITALS — BP 118/74 | HR 69 | Ht 64.0 in | Wt 146.0 lb

## 2018-01-29 DIAGNOSIS — Z30431 Encounter for routine checking of intrauterine contraceptive device: Secondary | ICD-10-CM | POA: Diagnosis not present

## 2018-01-29 NOTE — Progress Notes (Signed)
.  jfgynp  GYNECOLOGY OFFICE PROGRESS NOTE  History:  47 y.o. [id number] here today for today for IUD string check;  IUD was placed  4 wk ago. No complaints about the IUD, no concerning side effects.except the patient has had 2 menses this month, including this week  The following portions of the patient's history were reviewed and updated as appropriate: allergies, current medications, past family history, past medical history, past social history, past surgical history and problem list. Last pap was normal, .  Review of Systems:  Pertinent items are noted in HPI.   Objective:  Physical Exam Blood pressure 118/74, pulse 69, height 5\' 4"  (1.626 m), weight 146 lb (66.2 kg), last menstrual period 09/27/2017, unknown if currently breastfeeding. CONSTITUTIONAL: Well-developed, well-nourished female in no acute distress.  HENT:  Normocephalic, atraumatic. External right and left ear normal. Oropharynx is clear and moist EYES: Conjunctivae and EOM are normal. Pupils are equal, round, and reactive to light. No scleral icterus.  NECK: Normal range of motion, supple, no masses CARDIOVASCULAR: Normal heart rate noted RESPIRATORY: Effort and breath sounds normal, no problems with respiration noted ABDOMEN: Soft, no distention noted.   PELVIC: Normal appearing external genitalia; normal appearing vaginal mucosa and cervix.  IUD strings visualized, about 2 cm in length outside cervix.  Pelvic ULTRASOUND IS USED TO CONFIRM PROPER IUD LOCATION AND IT IS IN PROPER POSITION Assessment & Plan:  Normal IUD check. Patient to keep IUD in place for five years; can come in for removal if she desires pregnancy within the next five years. Routine preventative health maintenance measures emphasized.   [place], [employer] for [employer], [place]

## 2018-05-10 ENCOUNTER — Ambulatory Visit: Payer: BLUE CROSS/BLUE SHIELD | Admitting: Obstetrics and Gynecology

## 2018-05-13 ENCOUNTER — Telehealth: Payer: Self-pay | Admitting: *Deleted

## 2018-05-13 ENCOUNTER — Other Ambulatory Visit: Payer: Self-pay | Admitting: Obstetrics and Gynecology

## 2018-05-13 MED ORDER — MEGESTROL ACETATE 40 MG PO TABS
40.0000 mg | ORAL_TABLET | Freq: Three times a day (TID) | ORAL | 2 refills | Status: DC
Start: 2018-05-13 — End: 2019-05-30

## 2018-05-13 NOTE — Telephone Encounter (Signed)
Patient states she started bleeding heavily 4 days ago, bleeding total for 10 days now. Passing clots and at times cramping. Please advise.

## 2018-05-13 NOTE — Telephone Encounter (Signed)
I will send in a prescription for Megace 40 mg 3 times daily for patient.  Please notify her tomorrow, Donald Prose

## 2018-05-14 ENCOUNTER — Telehealth: Payer: Self-pay | Admitting: *Deleted

## 2018-05-14 NOTE — Telephone Encounter (Signed)
Patient informed Megace was sent to pharmacy.

## 2018-06-07 ENCOUNTER — Ambulatory Visit (INDEPENDENT_AMBULATORY_CARE_PROVIDER_SITE_OTHER): Payer: BLUE CROSS/BLUE SHIELD | Admitting: Obstetrics and Gynecology

## 2018-06-07 ENCOUNTER — Encounter: Payer: Self-pay | Admitting: Obstetrics and Gynecology

## 2018-06-07 VITALS — BP 117/72 | HR 72 | Wt 153.0 lb

## 2018-06-07 DIAGNOSIS — N904 Leukoplakia of vulva: Secondary | ICD-10-CM

## 2018-06-07 NOTE — Patient Instructions (Addendum)
Lichen Sclerosus also called vulvar dystrophy Lichen sclerosus is a skin problem. It can happen on any part of the body. It happens most often in the anal or genital areas. It can cause itching and discomfort. Treatment can help to control symptoms. This skin problem is not passed from one person to another (not contagious). The cause is not known. Follow these instructions at home:  Take over-the-counter and prescription medicines only as told by your doctor.  Use creams or ointments as told by your doctor.  Do not scratch the affected areas of skin.  Women should keep the vagina as clean and dry as they can.  Keep all follow-up visits as told by your doctor. This is important. Contact a doctor if:  Your redness, swelling, or pain gets worse.  You have fluid, blood, or pus coming from the area.  You have new patches (lesions) on your skin.  You have a fever.  You have pain during sex. This information is not intended to replace advice given to you by your health care provider. Make sure you discuss any questions you have with your health care provider. Document Released: 08/14/2008 Document Revised: 02/07/2016 Document Reviewed: 11/27/2014 Elsevier Interactive Patient Education  [employer].

## 2018-06-07 NOTE — Progress Notes (Signed)
Patient ID: [name], female   DOB: [birth year], 47 y.o.   MRN: [id number]    [facility] Visit  @DATE @            Patient name: Chava Dulac MRN [id number]  Date of birth: [birth year]  CC & HPI:  Renesmae Donahey is a 47 y.o. female presenting today for left side intermittent pain but believes it to be from the ovarian cysts. She also has vaginal itching on outer area of vagina that only happens every few months a year. Is taking megace, medicine takes 2-3 days to cease the bleeding, but didn't want to take it too much. Has bled for 5 days at the point in the week but before making appointment bled for 2 weeks with heavier flow. Notes abnormal period of 2 weeks   She had a 1 C-section delivering twins  ROS:  ROS +left side intermittent pain +vaginal itching +BTB on IUD -fever -chills All systems are negative except as noted in the HPI and PMH.   Pertinent History Reviewed:   Reviewed: Significant for C-section Medical         Past Medical History:  Diagnosis Date   Contraceptive management 02/15/2015   GERD without esophagitis    Hep C w/o coma, chronic ([facility])    Hx of migraines    Nexplanon removal 02/15/2015                              Surgical Hx:    Past Surgical History:  Procedure Laterality Date   CESAREAN SECTION     CHOLECYSTECTOMY     Medications: Reviewed & Updated - see associated section                       Current Outpatient Medications:    albuterol (VENTOLIN HFA) 108 (90 Base) MCG/ACT inhaler, Inhale 2 puffs into the lungs every 6 (six) hours as needed for wheezing or shortness of breath., Disp: , Rfl:    gabapentin (NEURONTIN) 100 MG capsule, Take 1 capsule (100 mg total) by mouth at bedtime., Disp: 90 capsule, Rfl: 2   ibuprofen (ADVIL,MOTRIN) 800 MG tablet, TK 1 T PO BID PRN, Disp: , Rfl: 0   megestrol (MEGACE) 40 MG tablet, Take 1 tablet (40 mg total) by mouth 3 (three) times daily. Until bleeding stops, Disp: 45 tablet,  Rfl: 2   rizatriptan (MAXALT) 10 MG tablet, TK 1 T PO Q 2 HOURS. MAX 3 PER DAY, Disp: , Rfl: 1  Current Facility-Administered Medications:    predniSONE (DELTASONE) tablet 10 mg, 10 mg, Oral, UD, [place], [place] Muta, MD   Social History: Reviewed -  reports that she has never smoked. She has never used smokeless tobacco.  Objective Findings:  Vitals: unknown if currently breastfeeding.  PHYSICAL EXAMINATION General appearance - alert, well appearing, and in no distress, oriented to person, place, and time and normal appearing weight Mental status - alert, oriented to person, place, and time, normal mood, behavior, speech, dress, motor activity, and thought processes, affect appropriate to mood  PELVIC Vulva- vulvar dystrophy Vagina -heavy mensus Cervix - IUD string visualized at good length,nonpurelent Uterus -  Bimanual-deferred  Assessment & Plan:   A:  1.  BTB on IUD 2. Vulvar dystrophy  P:  1.  Rx Clobetasol 2. Discussed the use of Megace once daily for a week after resolution of breakthrough bleeding each episode 3. discussed  the drive all the regimen, careful drying of the genital area after bathing, bathroom etc. use of cool fan loose clothing for sleeping 4. F/u in 3 weeks    By signing my name below, I, Samul Dada, attest that this documentation has been prepared under the direction and in the presence of Jonnie Kind, MD. Electronically Signed: Rainelle. 06/07/18. 1:24 PM.  I personally performed the services described in this documentation, which was SCRIBED in my presence. The recorded information has been reviewed and considered accurate. It has been edited as necessary during review. Jonnie Kind, MD

## 2018-06-08 ENCOUNTER — Telehealth: Payer: Self-pay | Admitting: Obstetrics and Gynecology

## 2018-06-08 MED ORDER — CLOBETASOL PROPIONATE 0.05 % EX CREA: 1.0000 "application " | TOPICAL_CREAM | CUTANEOUS | 0 refills | Status: DC

## 2018-06-09 ENCOUNTER — Telehealth: Payer: Self-pay | Admitting: *Deleted

## 2018-06-14 ENCOUNTER — Encounter (HOSPITAL_COMMUNITY): Payer: Self-pay

## 2018-06-14 NOTE — Therapy (Signed)
[place], [place], [address] Phone: [phone]   Fax:  [phone]  Patient Details  Name: Amber Russell MRN: [id number] Date of Birth: [birth year] Referring Provider:  No ref. provider found  Encounter Date: 06/14/2018   PHYSICAL THERAPY DISCHARGE SUMMARY  Visits from Start of Care: 5  Current functional level related to goals / functional outcomes: See last note   Remaining deficits: See last note   Education / Equipment: N/a   Plan: Patient agrees to discharge.  Patient goals were not met. Patient is being discharged due to not returning since the last visit.  ?????     Geraldine Solar PT, [facility] [address] [place], [place], [address] Phone: [phone]   Fax:  [phone]

## 2018-11-15 NOTE — Telephone Encounter (Signed)
Sent to nurse

## 2018-12-14 ENCOUNTER — Encounter (INDEPENDENT_AMBULATORY_CARE_PROVIDER_SITE_OTHER): Payer: Self-pay | Admitting: Internal Medicine

## 2018-12-14 DIAGNOSIS — E559 Vitamin D deficiency, unspecified: Secondary | ICD-10-CM | POA: Insufficient documentation

## 2018-12-14 DIAGNOSIS — Z13220 Encounter for screening for lipoid disorders: Secondary | ICD-10-CM | POA: Insufficient documentation

## 2018-12-14 DIAGNOSIS — G44009 Cluster headache syndrome, unspecified, not intractable: Secondary | ICD-10-CM | POA: Insufficient documentation

## 2018-12-14 DIAGNOSIS — Z131 Encounter for screening for diabetes mellitus: Secondary | ICD-10-CM | POA: Insufficient documentation

## 2018-12-14 DIAGNOSIS — R05 Cough: Secondary | ICD-10-CM

## 2018-12-14 DIAGNOSIS — R5383 Other fatigue: Secondary | ICD-10-CM

## 2018-12-14 DIAGNOSIS — R053 Chronic cough: Secondary | ICD-10-CM

## 2018-12-15 ENCOUNTER — Encounter: Payer: Self-pay | Admitting: Obstetrics and Gynecology

## 2019-01-04 ENCOUNTER — Ambulatory Visit (INDEPENDENT_AMBULATORY_CARE_PROVIDER_SITE_OTHER): Payer: BLUE CROSS/BLUE SHIELD | Admitting: Internal Medicine

## 2019-03-02 ENCOUNTER — Other Ambulatory Visit: Payer: Self-pay | Admitting: Nurse Practitioner

## 2019-03-02 ENCOUNTER — Other Ambulatory Visit (HOSPITAL_COMMUNITY)
Admission: RE | Admit: 2019-03-02 | Discharge: 2019-03-02 | Disposition: A | Discharge status: Home / Self Care | Payer: PRIVATE HEALTH INSURANCE | Source: Ambulatory Visit | Attending: Nurse Practitioner | Admitting: Nurse Practitioner

## 2019-03-02 ENCOUNTER — Other Ambulatory Visit (HOSPITAL_COMMUNITY): Payer: Self-pay | Admitting: Nurse Practitioner

## 2019-03-02 DIAGNOSIS — B182 Chronic viral hepatitis C: Secondary | ICD-10-CM

## 2019-03-02 DIAGNOSIS — Z129 Encounter for screening for malignant neoplasm, site unspecified: Secondary | ICD-10-CM

## 2019-03-02 DIAGNOSIS — K7400 Hepatic fibrosis, unspecified: Secondary | ICD-10-CM

## 2019-03-02 LAB — COMPREHENSIVE METABOLIC PANEL
ALT: 13 U/L (ref 0–44)
AST: 16 U/L (ref 15–41)
Albumin: 4.4 g/dL (ref 3.5–5.0)
Alkaline Phosphatase: 39 U/L (ref 38–126)
Anion gap: 12 (ref 5–15)
BUN: 14 mg/dL (ref 6–20)
CO2: 21 mmol/L — ABNORMAL LOW (ref 22–32)
Calcium: 9.2 mg/dL (ref 8.9–10.3)
Chloride: 107 mmol/L (ref 98–111)
Creatinine, Ser: 0.67 mg/dL (ref 0.44–1.00)
GFR calc Af Amer: >60 mL/min (ref >60)
GFR calc non Af Amer: >60 mL/min (ref >60)
Glucose, Bld: 91 mg/dL (ref 70–99)
Potassium: 4.1 mmol/L (ref 3.5–5.1)
Sodium: 140 mmol/L (ref 135–145)
Total Bilirubin: 1.2 mg/dL (ref 0.3–1.2)
Total Protein: 7.4 g/dL (ref 6.5–8.1)

## 2019-03-02 LAB — CBC WITH DIFFERENTIAL/PLATELET
Abs Immature Granulocytes: 0 10*3/uL (ref 0.00–0.07)
Basophils Absolute: 0 10*3/uL (ref 0.0–0.1)
Basophils Relative: 1 %
Eosinophils Absolute: 0 10*3/uL (ref 0.0–0.5)
Eosinophils Relative: 1 %
HCT: 44.9 % (ref 36.0–46.0)
Hemoglobin: 14.6 g/dL (ref 12.0–15.0)
Immature Granulocytes: 0 %
Lymphocytes Relative: 59 %
Lymphs Abs: 1.7 10*3/uL (ref 0.7–4.0)
MCH: 29.9 pg (ref 26.0–34.0)
MCHC: 32.5 g/dL (ref 30.0–36.0)
MCV: 91.8 fL (ref 80.0–100.0)
Monocytes Absolute: 0.3 10*3/uL (ref 0.1–1.0)
Monocytes Relative: 10 %
Neutro Abs: 0.8 10*3/uL — ABNORMAL LOW (ref 1.7–7.7)
Neutrophils Relative %: 29 %
Platelets: 128 10*3/uL — ABNORMAL LOW (ref 150–400)
RBC: 4.89 MIL/uL (ref 3.87–5.11)
RDW: 14 % (ref 11.5–15.5)
WBC: 2.8 10*3/uL — ABNORMAL LOW (ref 4.0–10.5)
nRBC: 0 % (ref 0.0–0.2)

## 2019-03-08 ENCOUNTER — Other Ambulatory Visit: Payer: Self-pay

## 2019-03-08 ENCOUNTER — Ambulatory Visit (HOSPITAL_COMMUNITY)
Admission: RE | Admit: 2019-03-08 | Discharge: 2019-03-08 | Disposition: A | Discharge status: Home / Self Care | Payer: PRIVATE HEALTH INSURANCE | Source: Ambulatory Visit | Attending: Nurse Practitioner | Admitting: Nurse Practitioner

## 2019-03-08 DIAGNOSIS — Z129 Encounter for screening for malignant neoplasm, site unspecified: Secondary | ICD-10-CM | POA: Insufficient documentation

## 2019-03-08 DIAGNOSIS — B182 Chronic viral hepatitis C: Secondary | ICD-10-CM | POA: Insufficient documentation

## 2019-05-27 ENCOUNTER — Telehealth: Payer: Self-pay | Admitting: Women's Health

## 2019-05-27 NOTE — Telephone Encounter (Signed)
Called patient regarding appointment scheduled in our office encouraged to come alone to the visit if possible, however, a support person, over age 46, may accompany her  to appointment if assistance is needed for safety or care concerns. Otherwise, support persons should remain outside until the visit is complete.  ° °We ask if you have had any exposure to anyone suspected or confirmed of having COVID-19 or if you are experiencing any of the following, to call and reschedule your appointment: fever, cough, shortness of breath, muscle pain, diarrhea, rash, vomiting, abdominal pain, red eye, weakness, bruising, bleeding, joint pain, or a severe headache.  ° °Please know we will ask you these questions or similar questions when you arrive for your appointment and again it’s how we are keeping everyone safe.   ° °Also,to keep you safe, please use the provided hand sanitizer when you enter the office. We are asking everyone in the office to wear a mask to help prevent the spread of °germs. If you have a mask of your own, please wear it to your appointment, if not, we are happy to provide one for you. ° °Thank you for understanding and your cooperation.  ° ° °[facility] Staff ° ° ° °

## 2019-05-30 ENCOUNTER — Other Ambulatory Visit: Payer: Self-pay

## 2019-05-30 ENCOUNTER — Encounter: Payer: Self-pay | Admitting: Women's Health

## 2019-05-30 ENCOUNTER — Other Ambulatory Visit (HOSPITAL_COMMUNITY)
Admission: RE | Admit: 2019-05-30 | Discharge: 2019-05-30 | Disposition: A | Discharge status: Home / Self Care | Payer: PRIVATE HEALTH INSURANCE | Source: Ambulatory Visit | Attending: Obstetrics & Gynecology | Admitting: Obstetrics & Gynecology

## 2019-05-30 ENCOUNTER — Ambulatory Visit (INDEPENDENT_AMBULATORY_CARE_PROVIDER_SITE_OTHER): Payer: PRIVATE HEALTH INSURANCE | Admitting: Women's Health

## 2019-05-30 VITALS — BP 113/68 | HR 69 | Ht 64.0 in | Wt 146.0 lb

## 2019-05-30 DIAGNOSIS — Z01419 Encounter for gynecological examination (general) (routine) without abnormal findings: Secondary | ICD-10-CM | POA: Diagnosis not present

## 2019-05-30 DIAGNOSIS — R102 Pelvic and perineal pain: Secondary | ICD-10-CM

## 2019-05-30 DIAGNOSIS — N9089 Other specified noninflammatory disorders of vulva and perineum: Secondary | ICD-10-CM

## 2019-05-30 MED ORDER — NYSTATIN-TRIAMCINOLONE 100000-0.1 UNIT/GM-% EX OINT: 1.0000 "application " | TOPICAL_OINTMENT | Freq: Two times a day (BID) | CUTANEOUS | 0 refills | Status: DC

## 2019-05-30 NOTE — Progress Notes (Signed)
   WELL-WOMAN EXAMINATION Patient name: Amber Russell MRN [id number]  Date of birth: [birth year] Chief Complaint:   Gynecologic Exam  History of Present Illness:   Amber Russell is a 48 y.o. [id number] female being seen today for a routine well-woman exam.  Current complaints: occ bilateral lower pelvic pain, vulvar itching x 2wks, no odor  PCP: Hasanji      does not desire labs Patient's last menstrual period was 05/29/2019. The current method of family planning is IUD inserted 11/25/17 Last pap 08/07/15. Results were: normal Last mammogram: 01/08/17. Results were: normal. Family h/o breast cancer: No Last colonoscopy: never. Results were: n/a. Family h/o colorectal cancer: No Review of Systems:   Pertinent items are noted in HPI Denies any headaches, blurred vision, fatigue, shortness of breath, chest pain, abdominal pain, abnormal vaginal discharge/itching/odor/irritation, problems with periods, bowel movements, urination, or intercourse unless otherwise stated above. Pertinent History Reviewed:  Reviewed past medical,surgical, social and family history.  Reviewed problem list, medications and allergies. Physical Assessment:   Vitals:   05/30/19 0917  BP: 113/68  Pulse: 69  Weight: 146 lb (66.2 kg)  Height: 5\' 4"  (1.626 m)  Body mass index is 25.06 kg/m.        Physical Examination:   General appearance - well appearing, and in no distress  Mental status - alert, oriented to person, place, and time  Psych:  She has a normal mood and affect  Skin - warm and dry, normal color, no suspicious lesions noted  Chest - effort normal, all lung fields clear to auscultation bilaterally  Heart - normal rate and regular rhythm  Neck:  midline trachea, no thyromegaly or nodules  Breasts - breasts appear normal, no suspicious masses, no skin or nipple changes or  axillary nodes  Abdomen - soft, nontender, nondistended, no masses or organomegaly  Pelvic - VULVA: slightly  erythematous/irritated, no masses, tenderness or lesions  VAGINA: normal appearing vagina with normal color and discharge, no lesions, on menses CERVIX: normal appearing cervix without discharge or lesions, no CMT, IUD strings visible  Thin prep pap is done w/ HR HPV cotesting  UTERUS: uterus is felt to be normal size, shape, consistency and nontender   ADNEXA: No adnexal masses or tenderness noted.  Extremities:  No swelling or varicosities noted  No results found for this or any previous visit (from the past 24 hour(s)).  Assessment & Plan:  1) Well-Woman Exam  2) Occ bilateral lower pelvic pain> exam normal today, let us know if worsens, will get pelvic u/s  3) Vulvar irritation> rx mycolog  Labs/procedures today: pap  Mammogram-call to schedule screening asap Colonoscopy @46yo  or sooner if problems  No orders of the defined types were placed in this encounter.   Meds:  Meds ordered this encounter  Medications  . nystatin-triamcinolone ointment (MYCOLOG)    Sig: Apply 1 application topically 2 (two) times daily.    Dispense:  30 g    Refill:  0    Order Specific Question:   Supervising Provider    Answer:   Florian Buff [[id number]]    Follow-up: Return in about 1 year (around 05/29/2020) for Physical.  [place], [facility]NP-BC 05/30/2019 9:36 AM

## 2019-05-31 LAB — CYTOLOGY - PAP
Diagnosis: NEGATIVE
HPV: NOT DETECTED

## 2019-06-01 ENCOUNTER — Other Ambulatory Visit (HOSPITAL_COMMUNITY): Payer: Self-pay | Admitting: Women's Health

## 2019-06-01 DIAGNOSIS — Z1231 Encounter for screening mammogram for malignant neoplasm of breast: Secondary | ICD-10-CM

## 2019-06-15 ENCOUNTER — Inpatient Hospital Stay (HOSPITAL_COMMUNITY): Admission: RE | Admit: 2019-06-15 | Payer: PRIVATE HEALTH INSURANCE | Source: Ambulatory Visit

## 2019-06-17 ENCOUNTER — Other Ambulatory Visit: Payer: Self-pay

## 2019-06-17 ENCOUNTER — Ambulatory Visit (HOSPITAL_COMMUNITY)
Admission: RE | Admit: 2019-06-17 | Discharge: 2019-06-17 | Disposition: A | Discharge status: Home / Self Care | Payer: PRIVATE HEALTH INSURANCE | Source: Ambulatory Visit | Attending: Women's Health | Admitting: Women's Health

## 2019-06-17 DIAGNOSIS — Z1231 Encounter for screening mammogram for malignant neoplasm of breast: Secondary | ICD-10-CM | POA: Diagnosis present

## 2019-06-23 ENCOUNTER — Ambulatory Visit (HOSPITAL_COMMUNITY): Payer: PRIVATE HEALTH INSURANCE

## 2019-07-29 ENCOUNTER — Telehealth: Payer: Self-pay | Admitting: Orthopedic Surgery

## 2019-07-29 NOTE — Telephone Encounter (Signed)
Patient, spouse, called earlier this morning to request same day appointment for arm pain problem, which I discussed with supervisor Abigail Butts; as previously relayed by Dr Aline Brochure, today's clinic is closed due to Dr scheduled to do surgery immediately following clinic. Relayed to patient, also asked if patient is being treated for this issue by any other providers. States has seen primary care, Dr Sherrie Sport, but "not helping her much."  Asked if I'd ask Dr Aline Brochure if he can see her Monday or as soon as possible.  We have requested primary care notes.

## 2019-07-30 ENCOUNTER — Other Ambulatory Visit: Payer: Self-pay | Admitting: Orthopedic Surgery

## 2019-07-30 DIAGNOSIS — M79603 Pain in arm, unspecified: Secondary | ICD-10-CM

## 2019-07-30 MED ORDER — GABAPENTIN 100 MG PO CAPS: 100.0000 mg | ORAL_CAPSULE | Freq: Three times a day (TID) | ORAL | 2 refills | Status: DC

## 2019-07-30 MED ORDER — PREDNISONE 10 MG (48) PO TBPK: ORAL_TABLET | Freq: Every day | ORAL | 0 refills | Status: DC

## 2019-09-14 ENCOUNTER — Ambulatory Visit: Payer: PRIVATE HEALTH INSURANCE | Admitting: Orthopedic Surgery

## 2019-09-14 ENCOUNTER — Other Ambulatory Visit: Payer: Self-pay

## 2019-09-14 ENCOUNTER — Ambulatory Visit: Payer: PRIVATE HEALTH INSURANCE

## 2019-09-14 VITALS — BP 121/78 | HR 55 | Temp 96.6°F | Ht 65.0 in | Wt 141.0 lb

## 2019-09-14 DIAGNOSIS — G8929 Other chronic pain: Secondary | ICD-10-CM | POA: Diagnosis not present

## 2019-09-14 DIAGNOSIS — M25512 Pain in left shoulder: Secondary | ICD-10-CM

## 2019-09-14 DIAGNOSIS — M542 Cervicalgia: Secondary | ICD-10-CM

## 2019-09-14 DIAGNOSIS — M4722 Other spondylosis with radiculopathy, cervical region: Secondary | ICD-10-CM

## 2019-09-14 MED ORDER — GABAPENTIN 100 MG PO CAPS: 100.0000 mg | ORAL_CAPSULE | Freq: Three times a day (TID) | ORAL | 2 refills | Status: DC

## 2019-09-14 MED ORDER — CYCLOBENZAPRINE HCL 5 MG PO TABS: 5.0000 mg | ORAL_TABLET | Freq: Three times a day (TID) | ORAL | 0 refills | Status: DC | PRN

## 2019-09-14 MED ORDER — PREDNISONE 10 MG PO TABS: 10.0000 mg | ORAL_TABLET | Freq: Every day | ORAL | 0 refills | Status: DC

## 2019-09-14 NOTE — Progress Notes (Signed)
Progress Note   Patient ID: Amber Russell, female   DOB: [birth year], 48 y.o.   MRN: [id number]  Body mass index is 23.46 kg/m.  Chief Complaint  Patient presents with  . Follow-up    left shoulder pain history of trigger point and cervical spondylosis    Encounter Diagnoses  Name Primary?  . Cervical spondylosis with radiculopathy Yes  . Chronic left shoulder pain     48 year old female seen 2 years ago for cervical spine pain reading to the left arm with some upper extremity weakness treated with prednisone and gabapentin  We noted x-ray findings of cervical spondylosis C4-7 with facet arthritis and loss of normal cervical lordosis  She comes in complaining of increasing cervical spine pain left shoulder pain radiating down into her left hand with weakness in the left upper extremity and numbness and tingling in the left upper extremity some weakness noted    ROS -constitutional symptoms were negative    BP 121/78   Pulse (!) 55   Temp (!) 96.6 F (35.9 C)   Ht 5\' 5"  (1.651 m)   Wt 141 lb (64 kg)   BMI 23.46 kg/m   Physical Exam   Medical decisions:  (Established problem worse, x-ray ,physical therapy, over-the-counter medicines, read outside film or summarize x-ray)  Data  Imaging:   Shoulder x-ray was relatively normal there was some abnormal contours in Shenton's line but otherwise normal  Her prior cervical spine x-ray showed no significant amount of cervical spondylosis  Recommend MRI cervical spine  Encounter Diagnoses  Name Primary?  . Cervical spondylosis with radiculopathy Yes  . Chronic left shoulder pain     PLAN:   MR cervical spine  Meds ordered this encounter  Medications  . predniSONE (DELTASONE) 10 MG tablet    Sig: Take 1 tablet (10 mg total) by mouth daily.    Dispense:  60 tablet    Refill:  0  . gabapentin (NEURONTIN) 100 MG capsule    Sig: Take 1 capsule (100 mg total) by mouth 3 (three) times daily.    Dispense:  90  capsule    Refill:  2  . cyclobenzaprine (FLEXERIL) 5 MG tablet    Sig: Take 1 tablet (5 mg total) by mouth 3 (three) times daily as needed for muscle spasms.    Dispense:  30 tablet    Refill:  0       Arther Abbott, MD 09/14/2019 3:25 PM

## 2019-09-14 NOTE — Patient Instructions (Addendum)
It looks like he had 2 problems 1 rotator cuff inflammation of the left shoulder and #2 arthritis and pinched nerve cervical spine or neck area  We will give you 3 medications take 1 for muscle spasm and 1 for nerve pain and 1 for inflammation  We will order MRI of your neck to see why the arm is going numb    Radicular Pain Radicular pain is a type of pain that spreads from your back or neck along a spinal nerve. Spinal nerves are nerves that leave the spinal cord and go to the muscles. Radicular pain is sometimes called radiculopathy, radiculitis, or a pinched nerve. When you have this type of pain, you may also have weakness, numbness, or tingling in the area of your body that is supplied by the nerve. The pain may feel sharp and burning. Depending on which spinal nerve is affected, the pain may occur in the:  Neck area (cervical radicular pain). You may also feel pain, numbness, weakness, or tingling in the arms.  Mid-spine area (thoracic radicular pain). You would feel this pain in the back and chest. This type is rare.  Lower back area (lumbar radicular pain). You would feel this pain as low back pain. You may feel pain, numbness, weakness, or tingling in the buttocks or legs. Sciatica is a type of lumbar radicular pain that shoots down the back of the leg. Radicular pain occurs when one of the spinal nerves becomes irritated or squeezed (compressed). It is often caused by something pushing on a spinal nerve, such as one of the bones of the spine (vertebrae) or one of the round cushions between vertebrae (intervertebral disks). This can result from:  An injury.  Wear and tear or aging of a disk.  The growth of a bone spur that pushes on the nerve. Radicular pain often goes away when you follow instructions from your health care provider for relieving pain at home. Follow these instructions at home: Managing pain      If directed, put ice on the affected area: ? Put ice in a  plastic bag. ? Place a towel between your skin and the bag. ? Leave the ice on for 20 minutes, 2-3 times a day.  If directed, apply heat to the affected area as often as told by your health care provider. Use the heat source that your health care provider recommends, such as a moist heat pack or a heating pad. ? Place a towel between your skin and the heat source. ? Leave the heat on for 20-30 minutes. ? Remove the heat if your skin turns bright red. This is especially important if you are unable to feel pain, heat, or cold. You may have a greater risk of getting burned. Activity   Do not sit or rest in bed for long periods of time.  Try to stay as active as possible. Ask your health care provider what type of exercise or activity is best for you.  Avoid activities that make your pain worse, such as bending and lifting.  Do not lift anything that is heavier than 10 lb (4.5 kg), or the limit that you are told, until your health care provider says that it is safe.  Practice using proper technique when lifting items. Proper lifting technique involves bending your knees and rising up.  Do strength and range-of-motion exercises only as told by your health care provider or physical therapist. General instructions  Take over-the-counter and prescription medicines only as told  by your health care provider.  Pay attention to any changes in your symptoms.  Keep all follow-up visits as told by your health care provider. This is important. ? Your health care provider may send you to a physical therapist to help with this pain. Contact a health care provider if:  Your pain and other symptoms get worse.  Your pain medicine is not helping.  Your pain has not improved after a few weeks of home care.  You have a fever. Get help right away if:  You have severe pain, weakness, or numbness.  You have difficulty with bladder or bowel control. Summary  Radicular pain is a type of pain that  spreads from your back or neck along a spinal nerve.  When you have radicular pain, you may also have weakness, numbness, or tingling in the area of your body that is supplied by the nerve.  The pain may feel sharp or burning.  Radicular pain may be treated with ice, heat, medicines, or physical therapy. This information is not intended to replace advice given to you by your health care provider. Make sure you discuss any questions you have with your health care provider. Document Released: 10/09/2004 Document Revised: 03/16/2018 Document Reviewed: 03/16/2018 Elsevier Patient Education  2020 [employer].

## 2019-09-15 ENCOUNTER — Telehealth: Payer: Self-pay | Admitting: Radiology

## 2019-09-15 DIAGNOSIS — M542 Cervicalgia: Secondary | ICD-10-CM

## 2019-09-15 DIAGNOSIS — M4722 Other spondylosis with radiculopathy, cervical region: Secondary | ICD-10-CM

## 2019-09-15 NOTE — Telephone Encounter (Signed)
I called patient. She told me she did have physical therapy. She had this done in 2018   The denial states the therapy should be in past 6 months. I have discussed with her again.   Sent order for physical therapy   To you FYI

## 2019-09-15 NOTE — Telephone Encounter (Addendum)
[facility] did not approve the MRI scan due to lack of 6 weeks of conservative care.   I will call patient to advise

## 2019-09-20 ENCOUNTER — Telehealth (HOSPITAL_COMMUNITY): Payer: Self-pay | Admitting: Physical Therapy

## 2019-09-20 NOTE — Telephone Encounter (Signed)
pt needs to change for tomorrow appt because of family emergency

## 2019-09-21 ENCOUNTER — Ambulatory Visit (HOSPITAL_COMMUNITY): Payer: PRIVATE HEALTH INSURANCE | Admitting: Physical Therapy

## 2019-09-26 ENCOUNTER — Encounter (HOSPITAL_COMMUNITY): Payer: Self-pay | Admitting: Physical Therapy

## 2019-09-26 ENCOUNTER — Ambulatory Visit (HOSPITAL_COMMUNITY): Payer: 59 | Attending: Orthopedic Surgery | Admitting: Physical Therapy

## 2019-09-26 ENCOUNTER — Other Ambulatory Visit: Payer: Self-pay

## 2019-09-26 DIAGNOSIS — M542 Cervicalgia: Secondary | ICD-10-CM | POA: Insufficient documentation

## 2019-09-26 DIAGNOSIS — M5412 Radiculopathy, cervical region: Secondary | ICD-10-CM | POA: Insufficient documentation

## 2019-09-26 DIAGNOSIS — R293 Abnormal posture: Secondary | ICD-10-CM | POA: Insufficient documentation

## 2019-09-26 NOTE — Therapy (Signed)
[place], [place], [address] Phone: [phone]   Fax:  [phone]  Physical Therapy Evaluation  Patient Details  Name: Amber Russell MRN: [id number] Date of Birth: [birth year] Referring Provider (PT): Arther Abbott MD   Encounter Date: 09/26/2019  PT End of Session - 09/26/19 1222    Visit Number  1    Number of Visits  12    Date for PT Re-Evaluation  11/11/19   reassess 10/21/19   Authorization Type  [employer] (30 visit limit) no auth req    Authorization Time Period  09/26/19- 11/11/19    Authorization - Visit Number  1    Authorization - Number of Visits  30    PT Start Time  0815    PT Stop Time  0900    PT Time Calculation (min)  45 min    Activity Tolerance  Patient tolerated treatment well       Past Medical History:  Diagnosis Date  . Contraceptive management 02/15/2015  . GERD without esophagitis   . Hep C w/o coma, chronic ([place])   . Hx of migraines   . Nexplanon removal 02/15/2015    Past Surgical History:  Procedure Laterality Date  . BILE DUCT EXPLORATION N/A   . CESAREAN SECTION    . CESAREAN SECTION     age 54  . CHOLECYSTECTOMY    . KNEE ARTHROPLASTY      There were no vitals filed for this visit.   Subjective Assessment - 09/26/19 0823    Subjective  Patient presents to physical therapy with complaint of radiating neck pain beginning about 2 years. Patient says pain began insidiously. Patient states she has had physical therapy for this problem about 2 years ago, which she found helpful but says that within the past few months pain has become progressively worse. Patient says she has occasional pain radiating into bilateral arms but states her LT arm is usually more often and usually LT side. Patient does reports intermittent numbness and tingling in both hands. Patient says she has had an xray of LT arm which was unremarkable but has not had xray of neck.    Limitations   Sitting;Reading;Lifting;House hold activities    Currently in Pain?  Yes    Pain Score  5     Pain Location  Shoulder    Pain Orientation  Left    Pain Descriptors / Indicators  Dull    Pain Onset  More than a month ago    Pain Frequency  Constant    Aggravating Factors   reaching, lifting, activity with UEs    Pain Relieving Factors  rest, meds    Effect of Pain on Daily Activities  limits         [facility] PT Assessment - 09/26/19 0001      Assessment   Medical Diagnosis  cervical radiculopathy bilateral    Referring Provider (PT)  Arther Abbott MD    Onset Date/Surgical Date  --   Over 2 years    Prior Therapy  yes in 2018       Precautions   Precautions  None      Restrictions   Weight Bearing Restrictions  No      Balance Screen   Has the patient fallen in the past 6 months  No      [place] residence    Living Arrangements  Spouse/significant  other;Children      Prior Function   Level of Independence  Independent      Cognition   Overall Cognitive Status  Within Functional Limits for tasks assessed      Observation/Other Assessments   Focus on Therapeutic Outcomes (FOTO)   42% limitation       Sensation   Light Touch  Appears Intact      Posture/Postural Control   Posture/Postural Control  Postural limitations    Postural Limitations  Rounded Shoulders;Forward head      ROM / Strength   AROM / PROM / Strength  AROM;Strength      AROM   AROM Assessment Site  Shoulder;Cervical    Cervical Flexion  --   [facility]   Cervical Extension  33    Cervical - Right Side Bend  20    Cervical - Left Side Bend  30    Cervical - Right Rotation  55    Cervical - Left Rotation  65      Strength   Strength Assessment Site  Shoulder    Right/Left Shoulder  Right;Left    Right Shoulder Flexion  4/5    Right Shoulder ABduction  4+/5    Right Shoulder Internal Rotation  5/5    Right Shoulder External Rotation  4/5    Left  Shoulder Flexion  4/5    Left Shoulder ABduction  4+/5    Left Shoulder Internal Rotation  5/5    Left Shoulder External Rotation  4/5      Flexibility   Soft Tissue Assessment /Muscle Length  --   Min/mod restriciton in Lt upper trap, levator     Palpation   Palpation comment  Moderate tenderness to palpation about LT upper trap and cervical paraspinals       Special Tests    Special Tests  Cervical    Cervical Tests  Spurling's      Spurling's   Findings  Negative    Side  --   bilateral               Objective measurements completed on examination: See above findings.      [facility] Adult PT Treatment/Exercise - 09/26/19 0001      Exercises   Exercises  Shoulder;Neck      Neck Exercises: Seated   Neck Retraction  10 reps      Shoulder Exercises: Seated   Retraction  Both;10 reps             PT Education - 09/26/19 0826    Education Details  Patient educated on evaluation findings, POC and HEP    Person(s) Educated  Patient    Methods  Explanation    Comprehension  Verbalized understanding       PT Short Term Goals - 09/26/19 1228      PT SHORT TERM GOAL #1   Title  Patient will be independent with initial HEP to improve functional outcomes    Time  3    Period  Weeks    Status  New    Target Date  10/21/19        PT Long Term Goals - 09/26/19 1228      PT LONG TERM GOAL #1   Title  Patient will improve FOTO score to <30% to indicate improvement in functional outcomes    Time  6    Period  Weeks    Status  New    Target Date  11/11/19      PT LONG TERM GOAL #2   Title  Patient will report at least 75% overall improvement in subjective complaint to indicate improvement in ability to perform ADLs.    Time  6    Period  Weeks    Status  New    Target Date  11/11/19      PT LONG TERM GOAL #3   Title  Patient will improve pain free cervical extension by at least 10 degress in order to perform overhead ADLs safely    Time  6     Period  Weeks    Status  New    Target Date  11/11/19      PT LONG TERM GOAL #4   Title  Patient will have equal to or > 4+/5 MMT throughout BUE to improve ability to perform UE ADLs, driving, lifting    Time  6    Period  Weeks    Status  New    Target Date  11/11/19             Plan - 09/26/19 1224    Clinical Impression Statement  Patient is a 49 y.o. female who presents to physical therapy with complaint of neck pain with bilateral radiculopathy. Patient demonstrates decreased strength, ROM restriction, reduced flexibility, increased tenderness to palpation and postural abnormalities which are likely contributing to symptoms of pain and are negatively impacting patient ability to perform ADLs. Patient will benefit from skilled physical therapy services to address these deficits to reduce pain and improve level of function with ADLs    Examination-Activity Limitations  Lift;Reach Overhead;Caring for Others;Carry    Examination-Participation Restrictions  Cleaning;Yard Work;Community Activity;Driving;Laundry    Stability/Clinical Decision Making  Stable/Uncomplicated    Clinical Decision Making  Low    Rehab Potential  Good    PT Frequency  2x / week    PT Duration  6 weeks    PT Treatment/Interventions  ADLs/Self Care Home Management;Biofeedback;Therapeutic exercise;Therapeutic activities;Patient/family education;Moist Heat;Neuromuscular re-education;DME Instruction;Manual techniques;Taping;Joint Manipulations;Spinal Manipulations;Passive range of motion    PT Next Visit Plan  Initiate treatment, review goals and HEP. Add manual to address restricitons in cervical/ periscapular area. Progress cervical ROM, and postural strengthening exercise as tolerated.    PT Home Exercise Plan  09/26/19: scap retraction, chin tucks    Consulted and Agree with Plan of Care  Patient       Patient will benefit from skilled therapeutic intervention in order to improve the following deficits and  impairments:  Pain, Decreased activity tolerance, Decreased range of motion, Decreased strength, Hypomobility, Impaired flexibility  Visit Diagnosis: Cervicalgia  Radiculopathy, cervical region  Abnormal posture     Problem List Patient Active Problem List   Diagnosis Date Noted  . Cough, persistent 12/14/2018  . Migraines, neuralgic 12/14/2018  . Vitamin D deficiency, unspecified 12/14/2018  . Fatigue 12/14/2018  . Perennial and seasonal allergic rhinitis 01/29/2016  . Seasonal allergic conjunctivitis 01/29/2016  . Moderate persistent asthma with mild exacerbation 01/29/2016  . DUB (dysfunctional uterine bleeding) 06/10/2013  . Dizziness 12/27/2012  . TMJ arthralgia 12/27/2012  . Varicose veins of lower extremity with inflammation 06/16/2012  . Chronic hepatitis C ([place]) 06/06/2011   12:34 PM, 09/26/19 Josue Hector PT DPT  Physical Therapist with [facility]  [phone] [place] [address] [place], [place], [address] Phone: [phone]   Fax:  [phone]  Name: Amber  Russell MRN: [id number] Date of Birth: 1971/06/24

## 2019-09-30 ENCOUNTER — Other Ambulatory Visit: Payer: Self-pay

## 2019-09-30 ENCOUNTER — Encounter (HOSPITAL_COMMUNITY): Payer: Self-pay

## 2019-09-30 ENCOUNTER — Ambulatory Visit (HOSPITAL_COMMUNITY): Payer: 59

## 2019-09-30 DIAGNOSIS — M542 Cervicalgia: Secondary | ICD-10-CM

## 2019-09-30 DIAGNOSIS — R293 Abnormal posture: Secondary | ICD-10-CM

## 2019-09-30 DIAGNOSIS — M5412 Radiculopathy, cervical region: Secondary | ICD-10-CM

## 2019-09-30 NOTE — Therapy (Signed)
[place], [place], [address] Phone: [phone]   Fax:  [phone]  Physical Therapy Treatment  Patient Details  Name: Amber Russell MRN: [id number] Date of Birth: 26-Aug-1971 Referring Provider (PT): Arther Abbott MD   Encounter Date: 09/30/2019  PT End of Session - 09/30/19 1310    Visit Number  2    Number of Visits  12    Date for PT Re-Evaluation  11/11/19   reassess 10/21/19   Authorization Type  [employer] (30 visit limit) no auth req    Authorization Time Period  09/26/19- 11/11/19    Authorization - Visit Number  2    Authorization - Number of Visits  30    PT Start Time  [id number]    PT Stop Time  1348    PT Time Calculation (min)  40 min    Activity Tolerance  Patient tolerated treatment well    Behavior During Therapy  [facility] for tasks assessed/performed       Past Medical History:  Diagnosis Date  . Contraceptive management 02/15/2015  . GERD without esophagitis   . Hep C w/o coma, chronic ([place])   . Hx of migraines   . Nexplanon removal 02/15/2015    Past Surgical History:  Procedure Laterality Date  . BILE DUCT EXPLORATION N/A   . CESAREAN SECTION    . CESAREAN SECTION     age 49  . CHOLECYSTECTOMY    . KNEE ARTHROPLASTY      There were no vitals filed for this visit.  Subjective Assessment - 09/30/19 1309    Subjective  Pt reports she is feeling good today.  No reports of pain today, has began HEP at home.  Reports she occassionally has radicular symptoms Lt>Rt, none currently.    Currently in Pain?  No/denies                       [facility] Adult PT Treatment/Exercise - 09/30/19 0001      Posture/Postural Control   Posture/Postural Control  Postural limitations    Postural Limitations  Rounded Shoulders;Forward head      Exercises   Exercises  Shoulder      Neck Exercises: Theraband   Scapula Retraction  10 reps;Red    Shoulder Extension  10 reps;Red    Rows   10 reps;Red      Neck Exercises: Seated   Neck Retraction  10 reps    Postural Training  Educated importance of posture    Other Seated Exercise  scapular retraction 10    Other Seated Exercise  3D cervical excursion      Manual Therapy   Manual Therapy  Soft tissue mobilization;Other (comment);Manual Traction    Manual therapy comments  Manual complete separate than rest of tx    Soft tissue mobilization  supine to UT and posterior cervical mm    Manual Traction  2x30"    Other Manual Therapy  suboccipital release x 2 min             PT Education - 09/30/19 1316    Education Details  Reviewed goals, assured compliance wiht HEP, pt able to demonstrate exercise and verbalize appropriate hold time.  Pt educated on importance of posture for pain control.    Person(s) Educated  Patient    Methods  Explanation;Demonstration    Comprehension  Verbalized understanding       PT Short Term Goals -  09/26/19 1228      PT SHORT TERM GOAL #1   Title  Patient will be independent with initial HEP to improve functional outcomes    Time  3    Period  Weeks    Status  New    Target Date  10/21/19        PT Long Term Goals - 09/26/19 1228      PT LONG TERM GOAL #1   Title  Patient will improve FOTO score to <30% to indicate improvement in functional outcomes    Time  6    Period  Weeks    Status  New    Target Date  11/11/19      PT LONG TERM GOAL #2   Title  Patient will report at least 75% overall improvement in subjective complaint to indicate improvement in ability to perform ADLs.    Time  6    Period  Weeks    Status  New    Target Date  11/11/19      PT LONG TERM GOAL #3   Title  Patient will improve pain free cervical extension by at least 10 degress in order to perform overhead ADLs safely    Time  6    Period  Weeks    Status  New    Target Date  11/11/19      PT LONG TERM GOAL #4   Title  Patient will have equal to or > 4+/5 MMT throughout BUE to improve  ability to perform UE ADLs, driving, lifting    Time  6    Period  Weeks    Status  New    Target Date  11/11/19            Plan - 09/30/19 1352    Clinical Impression Statement  Reviewed goals and assured compliance with HEP.  Pt able to demonstrate good form and appropriate hold times with current HEP.  Pt educated on importance of posture for pain control with verbalized understanding.  Therex focus on cervical mobility and postural strengthening with verbal and tactile cueing to assure correct form/mechanics with new exercises.  EOS with manual soft tissue mobilization to address restrictions in UT and posterior cervical mm, noted significant tension suboccipital lobe region, reports relief following manual.  Encouraged pt to stay hydrated following manual to reduce risk of headache.    Examination-Activity Limitations  Lift;Reach Overhead;Caring for Others;Carry    Examination-Participation Restrictions  Cleaning;Yard Work;Community Activity;Driving;Laundry    Stability/Clinical Decision Making  Stable/Uncomplicated    Rehab Potential  Good    PT Frequency  2x / week    PT Duration  6 weeks    PT Treatment/Interventions  ADLs/Self Care Home Management;Biofeedback;Therapeutic exercise;Therapeutic activities;Patient/family education;Moist Heat;Neuromuscular re-education;DME Instruction;Manual techniques;Taping;Joint Manipulations;Spinal Manipulations;Passive range of motion    PT Next Visit Plan  Add UT stretch and Wback next session.  Continue manual to address restricitons in cervical/ periscapular area. Progress cervical ROM, and postural strengthening exercise as tolerated.       Patient will benefit from skilled therapeutic intervention in order to improve the following deficits and impairments:  Pain, Decreased activity tolerance, Decreased range of motion, Decreased strength, Hypomobility, Impaired flexibility  Visit Diagnosis: Cervicalgia  Radiculopathy, cervical  region  Abnormal posture     Problem List Patient Active Problem List   Diagnosis Date Noted  . Cough, persistent 12/14/2018  . Migraines, neuralgic 12/14/2018  . Vitamin D deficiency, unspecified 12/14/2018  . Fatigue  12/14/2018  . Perennial and seasonal allergic rhinitis 01/29/2016  . Seasonal allergic conjunctivitis 01/29/2016  . Moderate persistent asthma with mild exacerbation 01/29/2016  . DUB (dysfunctional uterine bleeding) 06/10/2013  . Dizziness 12/27/2012  . TMJ arthralgia 12/27/2012  . Varicose veins of lower extremity with inflammation 06/16/2012  . Chronic hepatitis C ([place]) 06/06/2011   Ihor Austin, LPTA; [place]  Aldona Lento 09/30/2019, 2:04 PM  [place] [address] [place], [place], [address] Phone: [phone]   Fax:  [phone]  Name: Amber Russell MRN: [id number] Date of Birth: 22-Mar-1971

## 2019-10-03 ENCOUNTER — Ambulatory Visit (HOSPITAL_COMMUNITY): Payer: 59 | Admitting: Physical Therapy

## 2019-10-03 ENCOUNTER — Telehealth (HOSPITAL_COMMUNITY): Payer: Self-pay | Admitting: Physical Therapy

## 2019-10-03 NOTE — Telephone Encounter (Signed)
pt had to cancel today's appt and reschedule due to she has a dental appt

## 2019-10-05 ENCOUNTER — Ambulatory Visit (HOSPITAL_COMMUNITY): Payer: 59 | Admitting: Physical Therapy

## 2019-10-05 ENCOUNTER — Other Ambulatory Visit: Payer: Self-pay

## 2019-10-05 ENCOUNTER — Encounter (HOSPITAL_COMMUNITY): Payer: Self-pay | Admitting: Physical Therapy

## 2019-10-05 DIAGNOSIS — M542 Cervicalgia: Secondary | ICD-10-CM

## 2019-10-05 DIAGNOSIS — R293 Abnormal posture: Secondary | ICD-10-CM

## 2019-10-05 DIAGNOSIS — M5412 Radiculopathy, cervical region: Secondary | ICD-10-CM

## 2019-10-05 NOTE — Therapy (Signed)
[place], [place], [address] Phone: [phone]   Fax:  [phone]  Physical Therapy Treatment  Patient Details  Name: Amber Russell MRN: [id number] Date of Birth: 05/09/1971 Referring Provider (PT): Arther Abbott MD   Encounter Date: 10/05/2019  PT End of Session - 10/05/19 1356    Visit Number  3    Number of Visits  12    Date for PT Re-Evaluation  11/11/19   reassess 10/21/19   Authorization Type  [employer] (30 visit limit) no auth req    Authorization Time Period  09/26/19- 11/11/19    Authorization - Visit Number  3    Authorization - Number of Visits  30    PT Start Time  1351    PT Stop Time  1429    PT Time Calculation (min)  38 min    Activity Tolerance  Patient tolerated treatment well    Behavior During Therapy  [facility] for tasks assessed/performed       Past Medical History:  Diagnosis Date  . Contraceptive management 02/15/2015  . GERD without esophagitis   . Hep C w/o coma, chronic ([place])   . Hx of migraines   . Nexplanon removal 02/15/2015    Past Surgical History:  Procedure Laterality Date  . BILE DUCT EXPLORATION N/A   . CESAREAN SECTION    . CESAREAN SECTION     age 55  . CHOLECYSTECTOMY    . KNEE ARTHROPLASTY      There were no vitals filed for this visit.  Subjective Assessment - 10/05/19 1356    Subjective  Patient says she is feeling better and feels that exercises have been helping a little bit. Patient reports currently no pain, but notes that she had some discomfort and stiffness in LT side this morning.    Currently in Pain?  No/denies                       [facility] Adult PT Treatment/Exercise - 10/05/19 0001      Neck Exercises: Prone   Other Prone Exercise  prone cervical retraction 10 x 5"      Shoulder Exercises: Prone   Other Prone Exercises  prone scapular retraction 10 x5: retraction with shoulder ext 10 x 5"      Shoulder Exercises:  Standing   Horizontal ABduction  Both;15 reps;Theraband    Theraband Level (Shoulder Horizontal ABduction)  Level 2 (Red)    External Rotation  Both;15 reps;Theraband    Theraband Level (Shoulder External Rotation)  Level 2 (Red)      Manual Therapy   Manual Therapy  Manual Traction;Other (comment);Soft tissue mobilization    Manual therapy comments  Manual complete separate than rest of tx    Soft tissue mobilization  STM to bilateral sub occipitals    Manual Traction  5 x 60"    Other Manual Therapy  suboccipital release 3 x 60"               PT Short Term Goals - 09/26/19 1228      PT SHORT TERM GOAL #1   Title  Patient will be independent with initial HEP to improve functional outcomes    Time  3    Period  Weeks    Status  New    Target Date  10/21/19        PT Long Term Goals - 09/26/19 1228  PT LONG TERM GOAL #1   Title  Patient will improve FOTO score to <30% to indicate improvement in functional outcomes    Time  6    Period  Weeks    Status  New    Target Date  11/11/19      PT LONG TERM GOAL #2   Title  Patient will report at least 75% overall improvement in subjective complaint to indicate improvement in ability to perform ADLs.    Time  6    Period  Weeks    Status  New    Target Date  11/11/19      PT LONG TERM GOAL #3   Title  Patient will improve pain free cervical extension by at least 10 degress in order to perform overhead ADLs safely    Time  6    Period  Weeks    Status  New    Target Date  11/11/19      PT LONG TERM GOAL #4   Title  Patient will have equal to or > 4+/5 MMT throughout BUE to improve ability to perform UE ADLs, driving, lifting    Time  6    Period  Weeks    Status  New    Target Date  11/11/19            Plan - 10/05/19 1432    Clinical Impression Statement  Patient tolerated session well today. Patient able to progress scapular and postural strengthening exercise but did note mild discomfort in LT  shoulder and wrist with band resisted ER. Patient educated on adjusting form and hand positioning as well as limiting ROM to pain free range. Patient was able to then perform activity with improved form and comfort. Patient reported decreased stiffness/ discomfort in neck post manual treatment.    Examination-Activity Limitations  Lift;Reach Overhead;Caring for Others;Carry    Examination-Participation Restrictions  Cleaning;Yard Work;Community Activity;Driving;Laundry    Stability/Clinical Decision Making  Stable/Uncomplicated    Rehab Potential  Good    PT Frequency  2x / week    PT Duration  6 weeks    PT Treatment/Interventions  ADLs/Self Care Home Management;Biofeedback;Therapeutic exercise;Therapeutic activities;Patient/family education;Moist Heat;Neuromuscular re-education;DME Instruction;Manual techniques;Taping;Joint Manipulations;Spinal Manipulations;Passive range of motion    PT Next Visit Plan  Continue manual to address restricitons in cervical/ periscapular area. Progress cervical ROM, and postural strengthening exercise as tolerated.       Patient will benefit from skilled therapeutic intervention in order to improve the following deficits and impairments:  Pain, Decreased activity tolerance, Decreased range of motion, Decreased strength, Hypomobility, Impaired flexibility  Visit Diagnosis: Radiculopathy, cervical region  Abnormal posture  Cervicalgia     Problem List Patient Active Problem List   Diagnosis Date Noted  . Cough, persistent 12/14/2018  . Migraines, neuralgic 12/14/2018  . Vitamin D deficiency, unspecified 12/14/2018  . Fatigue 12/14/2018  . Perennial and seasonal allergic rhinitis 01/29/2016  . Seasonal allergic conjunctivitis 01/29/2016  . Moderate persistent asthma with mild exacerbation 01/29/2016  . DUB (dysfunctional uterine bleeding) 06/10/2013  . Dizziness 12/27/2012  . TMJ arthralgia 12/27/2012  . Varicose veins of lower extremity with  inflammation 06/16/2012  . Chronic hepatitis C ([place]) 06/06/2011   2:36 PM, 10/05/19 Josue Hector PT DPT  Physical Therapist with [facility]  [phone] [place] [address] [place], [place], [address] Phone: [phone]   Fax:  [phone]  Name: Amber Russell MRN: [id number] Date of Birth: 04/04/1971

## 2019-10-07 ENCOUNTER — Ambulatory Visit (HOSPITAL_COMMUNITY): Payer: 59

## 2019-10-10 ENCOUNTER — Ambulatory Visit (HOSPITAL_COMMUNITY): Payer: 59 | Admitting: Physical Therapy

## 2019-10-10 ENCOUNTER — Other Ambulatory Visit: Payer: Self-pay

## 2019-10-10 ENCOUNTER — Encounter (HOSPITAL_COMMUNITY): Payer: Self-pay | Admitting: Physical Therapy

## 2019-10-10 DIAGNOSIS — M5412 Radiculopathy, cervical region: Secondary | ICD-10-CM

## 2019-10-10 DIAGNOSIS — R293 Abnormal posture: Secondary | ICD-10-CM

## 2019-10-10 DIAGNOSIS — M542 Cervicalgia: Secondary | ICD-10-CM

## 2019-10-10 NOTE — Therapy (Signed)
[place], [place], [address] Phone: [phone]   Fax:  [phone]  Physical Therapy Treatment  Patient Details  Name: Amber Russell MRN: [id number] Date of Birth: 1971-08-06 Referring Provider (PT): Arther Abbott MD   Encounter Date: 10/10/2019  PT End of Session - 10/10/19 1302    Visit Number  4    Number of Visits  12    Date for PT Re-Evaluation  11/11/19   Reassess 10/21/19   Authorization Type  [employer] (30 visit limit) no auth req    Authorization Time Period  09/26/19- 11/11/19    Authorization - Visit Number  4    Authorization - Number of Visits  30    PT Start Time  1300    PT Stop Time  1345    PT Time Calculation (min)  45 min    Activity Tolerance  Patient tolerated treatment well    Behavior During Therapy  [facility] for tasks assessed/performed       Past Medical History:  Diagnosis Date  . Contraceptive management 02/15/2015  . GERD without esophagitis   . Hep C w/o coma, chronic ([place])   . Hx of migraines   . Nexplanon removal 02/15/2015    Past Surgical History:  Procedure Laterality Date  . BILE DUCT EXPLORATION N/A   . CESAREAN SECTION    . CESAREAN SECTION     age 49  . CHOLECYSTECTOMY    . KNEE ARTHROPLASTY      There were no vitals filed for this visit.  Subjective Assessment - 10/10/19 1304    Subjective  Patient says she is doing well today and feels that she has been having less pain in neck and shoulders. Patient reports compliance with HEP, with no new issues. Currently reports no pain.    Currently in Pain?  No/denies                       [facility] Adult PT Treatment/Exercise - 10/10/19 0001      Neck Exercises: Prone   Other Prone Exercise  prone cervical retraction 10 x 5"      Shoulder Exercises: Seated   Other Seated Exercises  Thoracic extesnion and bilateral rotation excursions, 5 x each       Shoulder Exercises: Prone   Other Prone  Exercises  prone scapular retraction 10 x5: retraction with shoulder ext 10 x 5"      Shoulder Exercises: Standing   Horizontal ABduction  Both;20 reps;Theraband    Theraband Level (Shoulder Horizontal ABduction)  Level 2 (Red)    External Rotation  Both;20 reps;Theraband    Theraband Level (Shoulder External Rotation)  Level 2 (Red)      Shoulder Exercises: Stretch   Corner Stretch  3 reps;20 seconds      Manual Therapy   Manual Therapy  Manual Traction;Other (comment);Soft tissue mobilization    Manual therapy comments  Manual complete separate than rest of tx    Soft tissue mobilization  STM to bilateral sub occipitals    Manual Traction  4 x 60"    Other Manual Therapy  suboccipital release 3 x 60"               PT Short Term Goals - 09/26/19 1228      PT SHORT TERM GOAL #1   Title  Patient will be independent with initial HEP to improve functional outcomes    Time  3    Period  Weeks    Status  New    Target Date  10/21/19        PT Long Term Goals - 09/26/19 1228      PT LONG TERM GOAL #1   Title  Patient will improve FOTO score to <30% to indicate improvement in functional outcomes    Time  6    Period  Weeks    Status  New    Target Date  11/11/19      PT LONG TERM GOAL #2   Title  Patient will report at least 75% overall improvement in subjective complaint to indicate improvement in ability to perform ADLs.    Time  6    Period  Weeks    Status  New    Target Date  11/11/19      PT LONG TERM GOAL #3   Title  Patient will improve pain free cervical extension by at least 10 degress in order to perform overhead ADLs safely    Time  6    Period  Weeks    Status  New    Target Date  11/11/19      PT LONG TERM GOAL #4   Title  Patient will have equal to or > 4+/5 MMT throughout BUE to improve ability to perform UE ADLs, driving, lifting    Time  6    Period  Weeks    Status  New    Target Date  11/11/19            Plan - 10/10/19 1545     Clinical Impression Statement  Patient tolerated session well today but did note some increased discomfort in LT shoulder with horizontal band abduction. Patient cued on lowering band below shoulder level which reduced discomfort level. Added thoracic extension and rotation excursions, as well as corner stretch to improve posturing and reduce stress to cervical region. Patient required verbal cueing and demo for proper body mechanics with added exercise. Patient instructed to include new stretches to HEP, and was issued updated HEP handout.    Examination-Activity Limitations  Lift;Reach Overhead;Caring for Others;Carry    Examination-Participation Restrictions  Cleaning;Yard Work;Community Activity;Driving;Laundry    Stability/Clinical Decision Making  Stable/Uncomplicated    Rehab Potential  Good    PT Frequency  2x / week    PT Duration  6 weeks    PT Treatment/Interventions  ADLs/Self Care Home Management;Biofeedback;Therapeutic exercise;Therapeutic activities;Patient/family education;Moist Heat;Neuromuscular re-education;DME Instruction;Manual techniques;Taping;Joint Manipulations;Spinal Manipulations;Passive range of motion    PT Next Visit Plan  Continue manual to address restricitons in cervical/ periscapular area. Progress cervical ROM, and postural strengthening exercise as tolerated. Add band rows/ extension next visit.    PT Home Exercise Plan  09/26/19: scap retraction, chin tucks; 10/10/19: corner stretch, thoracic extension, rotation stretching       Patient will benefit from skilled therapeutic intervention in order to improve the following deficits and impairments:  Pain, Decreased activity tolerance, Decreased range of motion, Decreased strength, Hypomobility, Impaired flexibility  Visit Diagnosis: Radiculopathy, cervical region  Abnormal posture  Cervicalgia     Problem List Patient Active Problem List   Diagnosis Date Noted  . Cough, persistent 12/14/2018  .  Migraines, neuralgic 12/14/2018  . Vitamin D deficiency, unspecified 12/14/2018  . Fatigue 12/14/2018  . Perennial and seasonal allergic rhinitis 01/29/2016  . Seasonal allergic conjunctivitis 01/29/2016  . Moderate persistent asthma with mild exacerbation 01/29/2016  . DUB (dysfunctional uterine bleeding) 06/10/2013  .  Dizziness 12/27/2012  . TMJ arthralgia 12/27/2012  . Varicose veins of lower extremity with inflammation 06/16/2012  . Chronic hepatitis C ([place]) 06/06/2011   3:47 PM, 10/10/19 Josue Hector PT DPT  Physical Therapist with [facility]  [phone] [place] [address] [place], [place], [address] Phone: [phone]   Fax:  [phone]  Name: Donnett Eimers MRN: [id number] Date of Birth: Jan 17, 1971

## 2019-10-12 ENCOUNTER — Ambulatory Visit (HOSPITAL_COMMUNITY): Payer: 59 | Admitting: Physical Therapy

## 2019-10-12 ENCOUNTER — Other Ambulatory Visit: Payer: Self-pay

## 2019-10-12 ENCOUNTER — Encounter (HOSPITAL_COMMUNITY): Payer: Self-pay | Admitting: Physical Therapy

## 2019-10-12 DIAGNOSIS — M5412 Radiculopathy, cervical region: Secondary | ICD-10-CM

## 2019-10-12 DIAGNOSIS — M542 Cervicalgia: Secondary | ICD-10-CM | POA: Diagnosis not present

## 2019-10-12 DIAGNOSIS — R293 Abnormal posture: Secondary | ICD-10-CM

## 2019-10-12 NOTE — Patient Instructions (Signed)
Access Code: [email or web address]  URL: [email or web address]  Date: 10/12/2019  Prepared by: Josue Hector   Exercises Shoulder External Rotation and Scapular Retraction with Resistance - 10 reps - 2 sets - 1x daily - 3x weekly Standing Shoulder Horizontal Abduction with Resistance - 10 reps - 2 sets - 1x daily - 3x weekly Standing Row with Anchored Resistance - 10 reps - 2 sets - 1x daily - 3x weekly Shoulder Extension with Resistance - 10 reps - 2 sets - 1x daily - 3x weekly

## 2019-10-12 NOTE — Therapy (Signed)
[place], [place], [address] Phone: [phone]   Fax:  [phone]  Physical Therapy Treatment  Patient Details  Name: Amber Russell MRN: [id number] Date of Birth: [birth year] Referring Provider (PT): Arther Abbott MD   Encounter Date: 10/12/2019  PT End of Session - 10/12/19 1441    Visit Number  5    Number of Visits  12    Date for PT Re-Evaluation  11/11/19   Reassess 10/21/19   Authorization Type  [employer] (30 visit limit) no auth req    Authorization Time Period  09/26/19- 11/11/19    Authorization - Visit Number  5    Authorization - Number of Visits  30    PT Start Time  [id number]    PT Stop Time  1515    PT Time Calculation (min)  40 min    Activity Tolerance  Patient tolerated treatment well    Behavior During Therapy  [facility] for tasks assessed/performed       Past Medical History:  Diagnosis Date  . Contraceptive management 02/15/2015  . GERD without esophagitis   . Hep C w/o coma, chronic ([place])   . Hx of migraines   . Nexplanon removal 02/15/2015    Past Surgical History:  Procedure Laterality Date  . BILE DUCT EXPLORATION N/A   . CESAREAN SECTION    . CESAREAN SECTION     age 38  . CHOLECYSTECTOMY    . KNEE ARTHROPLASTY      There were no vitals filed for this visit.  Subjective Assessment - 10/12/19 1440    Subjective  Patient says she is doing well today reports no pain currently. Patient says she felt fine after last visit, but was not able to do her exercise today because she was busy helping her husband.    Currently in Pain?  No/denies                       [facility] Adult PT Treatment/Exercise - 10/12/19 0001      Shoulder Exercises: Seated   Other Seated Exercises  Thoracic extesnion and bilateral rotation excursions, 5 x each       Shoulder Exercises: Standing   Horizontal ABduction  Both;20 reps;Theraband    Theraband Level (Shoulder Horizontal ABduction)   Level 2 (Red)    External Rotation  Both;20 reps;Theraband    Theraband Level (Shoulder External Rotation)  Level 2 (Red)    Extension  Both;20 reps;Theraband    Theraband Level (Shoulder Extension)  Level 2 (Red)    Row  Both;20 reps;Theraband    Theraband Level (Shoulder Row)  Level 2 (Red)      Shoulder Exercises: Stretch   Corner Stretch  3 reps;20 seconds      Manual Therapy   Manual Therapy  Manual Traction;Other (comment);Soft tissue mobilization    Manual therapy comments  Manual complete separate than rest of tx    Soft tissue mobilization  STM to bilateral sub occipitals    Manual Traction  4 x 60"    Other Manual Therapy  suboccipital release 3 x 60"               PT Short Term Goals - 09/26/19 1228      PT SHORT TERM GOAL #1   Title  Patient will be independent with initial HEP to improve functional outcomes    Time  3    Period  Weeks  Status  New    Target Date  10/21/19        PT Long Term Goals - 09/26/19 1228      PT LONG TERM GOAL #1   Title  Patient will improve FOTO score to <30% to indicate improvement in functional outcomes    Time  6    Period  Weeks    Status  New    Target Date  11/11/19      PT LONG TERM GOAL #2   Title  Patient will report at least 75% overall improvement in subjective complaint to indicate improvement in ability to perform ADLs.    Time  6    Period  Weeks    Status  New    Target Date  11/11/19      PT LONG TERM GOAL #3   Title  Patient will improve pain free cervical extension by at least 10 degress in order to perform overhead ADLs safely    Time  6    Period  Weeks    Status  New    Target Date  11/11/19      PT LONG TERM GOAL #4   Title  Patient will have equal to or > 4+/5 MMT throughout BUE to improve ability to perform UE ADLs, driving, lifting    Time  6    Period  Weeks    Status  New    Target Date  11/11/19            Plan - 10/12/19 1524    Clinical Impression Statement  Patient  tolerated session well with no increased complaint of pain. Patient able to progress to band rows and extensions. Patient cued on form with band horizontal abduction to initiate movement with scapular retraction. Patient noted improved ease of movement with correction. Patient did note slight muscle fatigue in LT shoulder during band exercise but no pain. Patient required tactile cueing for scapular retractions during band rows for target muscle activation. Patient educated on and issued updated HEP handout.    Examination-Activity Limitations  Lift;Reach Overhead;Caring for Others;Carry    Examination-Participation Restrictions  Cleaning;Yard Work;Community Activity;Driving;Laundry    Stability/Clinical Decision Making  Stable/Uncomplicated    Rehab Potential  Good    PT Frequency  2x / week    PT Duration  6 weeks    PT Treatment/Interventions  ADLs/Self Care Home Management;Biofeedback;Therapeutic exercise;Therapeutic activities;Patient/family education;Moist Heat;Neuromuscular re-education;DME Instruction;Manual techniques;Taping;Joint Manipulations;Spinal Manipulations;Passive range of motion    PT Next Visit Plan  Continue manual to address restricitons in cervical/ periscapular area. Progress cervical ROM, and postural strengthening exercise as tolerated. Add band X pull apart.    PT Home Exercise Plan  09/26/19: scap retraction, chin tucks; 10/10/19: corner stretch, thoracic extension, rotation stretching       Patient will benefit from skilled therapeutic intervention in order to improve the following deficits and impairments:  Pain, Decreased activity tolerance, Decreased range of motion, Decreased strength, Hypomobility, Impaired flexibility  Visit Diagnosis: Radiculopathy, cervical region  Abnormal posture  Cervicalgia     Problem List Patient Active Problem List   Diagnosis Date Noted  . Cough, persistent 12/14/2018  . Migraines, neuralgic 12/14/2018  . Vitamin D deficiency,  unspecified 12/14/2018  . Fatigue 12/14/2018  . Perennial and seasonal allergic rhinitis 01/29/2016  . Seasonal allergic conjunctivitis 01/29/2016  . Moderate persistent asthma with mild exacerbation 01/29/2016  . DUB (dysfunctional uterine bleeding) 06/10/2013  . Dizziness 12/27/2012  . TMJ arthralgia 12/27/2012  .  Varicose veins of lower extremity with inflammation 06/16/2012  . Chronic hepatitis C ([place]) 06/06/2011   3:26 PM, 10/12/19 Josue Hector PT DPT  Physical Therapist with [facility]  [phone] [place] [address] [place], [place], [address] Phone: [phone]   Fax:  [phone]  Name: Amber Russell MRN: [id number] Date of Birth: October 27, 1970

## 2019-10-17 ENCOUNTER — Other Ambulatory Visit: Payer: Self-pay

## 2019-10-17 ENCOUNTER — Encounter (HOSPITAL_COMMUNITY): Payer: Self-pay | Admitting: Physical Therapy

## 2019-10-17 ENCOUNTER — Ambulatory Visit (HOSPITAL_COMMUNITY): Payer: 59 | Attending: Orthopedic Surgery | Admitting: Physical Therapy

## 2019-10-17 DIAGNOSIS — M5412 Radiculopathy, cervical region: Secondary | ICD-10-CM

## 2019-10-17 DIAGNOSIS — M542 Cervicalgia: Secondary | ICD-10-CM

## 2019-10-17 DIAGNOSIS — R293 Abnormal posture: Secondary | ICD-10-CM

## 2019-10-17 NOTE — Therapy (Signed)
[place], [place], [address] Phone: [phone]   Fax:  [phone]  Physical Therapy Treatment  Patient Details  Name: Amber Russell MRN: [id number] Date of Birth: 11/25/1970 Referring Provider (PT): Arther Abbott MD   Encounter Date: 10/17/2019  PT End of Session - 10/17/19 1439    Visit Number  6    Number of Visits  12    Date for PT Re-Evaluation  11/11/19   Reassess 10/21/19   Authorization Type  [employer] (30 visit limit) no auth req    Authorization Time Period  09/26/19- 11/11/19    Authorization - Visit Number  6    Authorization - Number of Visits  30    PT Start Time  [id number]    PT Stop Time  1520    PT Time Calculation (min)  45 min    Activity Tolerance  Patient tolerated treatment well    Behavior During Therapy  [facility] for tasks assessed/performed       Past Medical History:  Diagnosis Date  . Contraceptive management 02/15/2015  . GERD without esophagitis   . Hep C w/o coma, chronic ([place])   . Hx of migraines   . Nexplanon removal 02/15/2015    Past Surgical History:  Procedure Laterality Date  . BILE DUCT EXPLORATION N/A   . CESAREAN SECTION    . CESAREAN SECTION     age 49  . CHOLECYSTECTOMY    . KNEE ARTHROPLASTY      There were no vitals filed for this visit.  Subjective Assessment - 10/17/19 1438    Subjective  Patient says things are going well today, reports no new issues since last visit. No pain currently in neck or shoulders.    Currently in Pain?  No/denies                       [facility] Adult PT Treatment/Exercise - 10/17/19 0001      Shoulder Exercises: Seated   Other Seated Exercises  Thoracic extesnion and bilateral rotation excursions, 5 x each       Shoulder Exercises: Standing   Horizontal ABduction  Both;20 reps;Theraband    Theraband Level (Shoulder Horizontal ABduction)  Level 3 (Green)    External Rotation  Both;20 reps;Theraband    Theraband Level (Shoulder External Rotation)  Level 3 (Green)    Extension  Both;20 reps;Theraband    Theraband Level (Shoulder Extension)  Level 3 (Green)    Row  [employer];Theraband    Theraband Level (Shoulder Row)  Level 3 (Green)    Other Standing Exercises  Ball on wall shoulder circles. red x20 CW/CCW      Manual Therapy   Manual Therapy  Manual Traction;Other (comment);Soft tissue mobilization    Manual therapy comments  Manual complete separate than rest of tx    Soft tissue mobilization  STM to bilateral sub occipitals    Manual Traction  4 x 60"    Other Manual Therapy  suboccipital release 3 x 60"               PT Short Term Goals - 09/26/19 1228      PT SHORT TERM GOAL #1   Title  Patient will be independent with initial HEP to improve functional outcomes    Time  3    Period  Weeks    Status  New    Target Date  10/21/19  PT Long Term Goals - 09/26/19 1228      PT LONG TERM GOAL #1   Title  Patient will improve FOTO score to <30% to indicate improvement in functional outcomes    Time  6    Period  Weeks    Status  New    Target Date  11/11/19      PT LONG TERM GOAL #2   Title  Patient will report at least 75% overall improvement in subjective complaint to indicate improvement in ability to perform ADLs.    Time  6    Period  Weeks    Status  New    Target Date  11/11/19      PT LONG TERM GOAL #3   Title  Patient will improve pain free cervical extension by at least 10 degress in order to perform overhead ADLs safely    Time  6    Period  Weeks    Status  New    Target Date  11/11/19      PT LONG TERM GOAL #4   Title  Patient will have equal to or > 4+/5 MMT throughout BUE to improve ability to perform UE ADLs, driving, lifting    Time  6    Period  Weeks    Status  New    Target Date  11/11/19            Plan - 10/17/19 1544    Clinical Impression Statement  Patient making good progress to therapy goals. Patient shows  improved shoulder and scapular strength as well as good return with added exercise to home program. Patient required cues for eccentric control of band resisted shoulder extension. Patient able to progress all band exercise to green band resistance today with no pain but did note increased shoulder muscle fatigue. Patient required verbal cueing and demo for proper body mechanics with added shoulder ball on wall exercise.    Examination-Activity Limitations  Lift;Reach Overhead;Caring for Others;Carry    Examination-Participation Restrictions  Cleaning;Yard Work;Community Activity;Driving;Laundry    Stability/Clinical Decision Making  Stable/Uncomplicated    Rehab Potential  Good    PT Frequency  2x / week    PT Duration  6 weeks    PT Treatment/Interventions  ADLs/Self Care Home Management;Biofeedback;Therapeutic exercise;Therapeutic activities;Patient/family education;Moist Heat;Neuromuscular re-education;DME Instruction;Manual techniques;Taping;Joint Manipulations;Spinal Manipulations;Passive range of motion    PT Next Visit Plan  Reassess next visit, review progress to LTGs. Continue to progress postural and scapular strengthening as tolerated.    PT Home Exercise Plan  09/26/19: scap retraction, chin tucks; 10/10/19: corner stretch, thoracic extension, rotation stretching       Patient will benefit from skilled therapeutic intervention in order to improve the following deficits and impairments:  Pain, Decreased activity tolerance, Decreased range of motion, Decreased strength, Hypomobility, Impaired flexibility  Visit Diagnosis: Radiculopathy, cervical region  Abnormal posture  Cervicalgia     Problem List Patient Active Problem List   Diagnosis Date Noted  . Cough, persistent 12/14/2018  . Migraines, neuralgic 12/14/2018  . Vitamin D deficiency, unspecified 12/14/2018  . Fatigue 12/14/2018  . Perennial and seasonal allergic rhinitis 01/29/2016  . Seasonal allergic conjunctivitis  01/29/2016  . Moderate persistent asthma with mild exacerbation 01/29/2016  . DUB (dysfunctional uterine bleeding) 06/10/2013  . Dizziness 12/27/2012  . TMJ arthralgia 12/27/2012  . Varicose veins of lower extremity with inflammation 06/16/2012  . Chronic hepatitis C ([place]) 06/06/2011   3:47 PM, 10/17/19 Josue Hector PT DPT  Physical  Therapist with [facility]  [phone] [place] [address] [place], [place], [address] Phone: [phone]   Fax:  [phone]  Name: Amber Russell MRN: [id number] Date of Birth: [birth year]

## 2019-10-19 ENCOUNTER — Ambulatory Visit (HOSPITAL_COMMUNITY): Payer: 59 | Admitting: Physical Therapy

## 2019-10-19 ENCOUNTER — Telehealth (HOSPITAL_COMMUNITY): Payer: Self-pay | Admitting: Physical Therapy

## 2019-10-19 NOTE — Telephone Encounter (Signed)
She has a migrain headache today and can not drive here for her apptment, she plans to return on Monday

## 2019-10-24 ENCOUNTER — Other Ambulatory Visit: Payer: Self-pay

## 2019-10-24 ENCOUNTER — Ambulatory Visit (HOSPITAL_COMMUNITY): Payer: 59 | Admitting: Physical Therapy

## 2019-10-24 DIAGNOSIS — M542 Cervicalgia: Secondary | ICD-10-CM

## 2019-10-24 DIAGNOSIS — M5412 Radiculopathy, cervical region: Secondary | ICD-10-CM | POA: Diagnosis not present

## 2019-10-24 DIAGNOSIS — R293 Abnormal posture: Secondary | ICD-10-CM

## 2019-10-24 NOTE — Therapy (Signed)
[place], [place], [address] Phone: [phone]   Fax:  [phone]  Physical Therapy Treatment/ Progress Note  Patient Details  Name: Amber Russell MRN: [id number] Date of Birth: [birth year] Referring Provider (PT): Arther Abbott MD   Encounter Date: 10/24/2019   Progress Note Reporting Period 09/26/19 to 10/24/19  See note below for Objective Data and Assessment of Progress/Goals.       PT End of Session - 10/24/19 1403    Visit Number  7    Number of Visits  12    Date for PT Re-Evaluation  11/11/19   Progress note done 10/24/19   Authorization Type  [employer] (30 visit limit) no auth req    Authorization Time Period  09/26/19- 11/11/19    Authorization - Visit Number  7    Authorization - Number of Visits  30    PT Start Time  1350    PT Stop Time  1430    PT Time Calculation (min)  40 min    Activity Tolerance  Patient tolerated treatment well    Behavior During Therapy  [facility] for tasks assessed/performed       Past Medical History:  Diagnosis Date  . Contraceptive management 02/15/2015  . GERD without esophagitis   . Hep C w/o coma, chronic ([place])   . Hx of migraines   . Nexplanon removal 02/15/2015    Past Surgical History:  Procedure Laterality Date  . BILE DUCT EXPLORATION N/A   . CESAREAN SECTION    . CESAREAN SECTION     age 89  . CHOLECYSTECTOMY    . KNEE ARTHROPLASTY      There were no vitals filed for this visit.  Subjective Assessment - 10/24/19 1418    Subjective  Patient reports overall improvement in pain and ROM. Continues to be limited by weakness and difficulty reaching out and backward. States pain is much better since starting therapy. Reports overall improvement of 50% since starting therapy.    Limitations  Sitting;Reading;Lifting;House hold activities    Currently in Pain?  No/denies         [facility] PT Assessment - 10/24/19 0001      Assessment   Medical  Diagnosis  cervical radiculopathy bilateral    Referring Provider (PT)  Arther Abbott MD    Onset Date/Surgical Date  --   chronic   Prior Therapy  yes in 2018       Precautions   Precautions  None      Restrictions   Weight Bearing Restrictions  No      Home Environment   Living Environment  Private residence      Cognition   Overall Cognitive Status  Within Functional Limits for tasks assessed      Observation/Other Assessments   Focus on Therapeutic Outcomes (FOTO)   33% limited   was 42%     AROM   Cervical Flexion  --   [facility]   Cervical Extension  47    Cervical - Right Side Bend  28    Cervical - Left Side Bend  35    Cervical - Right Rotation  68    Cervical - Left Rotation  67      Strength   Right Shoulder Flexion  4+/5   was 4   Right Shoulder ABduction  4+/5    Right Shoulder Internal Rotation  5/5    Right Shoulder External Rotation  4+/5  was 4   Left Shoulder Flexion  4+/5   was 4   Left Shoulder ABduction  4+/5    Left Shoulder Internal Rotation  5/5    Left Shoulder External Rotation  4+/5   was 4                          PT Education - 10/24/19 1419    Education Details  APtient educated on reassessment findings and POC    Person(s) Educated  Patient    Methods  Explanation    Comprehension  Verbalized understanding       PT Short Term Goals - 10/24/19 1427      PT SHORT TERM GOAL #1   Title  Patient will be independent with initial HEP to improve functional outcomes    Time  3    Period  Weeks    Status  Achieved    Target Date  10/21/19        PT Long Term Goals - 10/24/19 1427      PT LONG TERM GOAL #1   Title  Patient will improve FOTO score to <30% to indicate improvement in functional outcomes    Baseline  cuurent: 33% limited    Time  6    Period  Weeks    Status  On-going      PT LONG TERM GOAL #2   Title  Patient will report at least 75% overall improvement in subjective complaint to indicate  improvement in ability to perform ADLs.    Baseline  current: 50%    Time  6    Period  Weeks    Status  On-going      PT LONG TERM GOAL #3   Title  Patient will improve pain free cervical extension by at least 10 degress in order to perform overhead ADLs safely    Baseline  current: 47 dg    Time  6    Period  Weeks    Status  Achieved      PT LONG TERM GOAL #4   Title  Patient will have equal to or > 4+/5 MMT throughout BUE to improve ability to perform UE ADLs, driving, lifting    Time  6    Period  Weeks    Status  Achieved            Plan - 10/24/19 1835    Clinical Impression Statement  Patient is making good progress to therapy goals. Patient currently with 1/1 short term and 2/4 long term therapy goals met. Patient with improved strength and ROM, but continues to be limited by pain with activity, and muscle restrictions which continue to negatively impact function. Patient will continue to benefit from skilled therapy services to address remaining deficits to reduce pain and improve level of function with UE ADLs.    Examination-Activity Limitations  Lift;Reach Overhead;Caring for Others;Carry    Examination-Participation Restrictions  Cleaning;Yard Work;Community Activity;Driving;Laundry    Stability/Clinical Decision Making  Stable/Uncomplicated    Rehab Potential  Good    PT Frequency  2x / week    PT Duration  6 weeks    PT Treatment/Interventions  ADLs/Self Care Home Management;Biofeedback;Therapeutic exercise;Therapeutic activities;Patient/family education;Moist Heat;Neuromuscular re-education;DME Instruction;Manual techniques;Taping;Joint Manipulations;Spinal Manipulations;Passive range of motion    PT Next Visit Plan  Continue to progress postural and scapular strengthening as tolerated.    PT Home Exercise Plan  09/26/19: scap retraction, chin tucks; 10/10/19:  corner stretch, thoracic extension, rotation stretching       Patient will benefit from skilled  therapeutic intervention in order to improve the following deficits and impairments:  Pain, Decreased activity tolerance, Decreased range of motion, Decreased strength, Hypomobility, Impaired flexibility  Visit Diagnosis: Radiculopathy, cervical region  Abnormal posture  Cervicalgia     Problem List Patient Active Problem List   Diagnosis Date Noted  . Cough, persistent 12/14/2018  . Migraines, neuralgic 12/14/2018  . Vitamin D deficiency, unspecified 12/14/2018  . Fatigue 12/14/2018  . Perennial and seasonal allergic rhinitis 01/29/2016  . Seasonal allergic conjunctivitis 01/29/2016  . Moderate persistent asthma with mild exacerbation 01/29/2016  . DUB (dysfunctional uterine bleeding) 06/10/2013  . Dizziness 12/27/2012  . TMJ arthralgia 12/27/2012  . Varicose veins of lower extremity with inflammation 06/16/2012  . Chronic hepatitis C ([place]) 06/06/2011   6:41 PM, 10/24/19 Josue Hector PT DPT  Physical Therapist with [facility]  [phone] [place] [address] [place], [place], [address] Phone: [phone]   Fax:  [phone]  Name: Amber Russell MRN: [id number] Date of Birth: 10/20/1970

## 2019-10-26 ENCOUNTER — Ambulatory Visit (HOSPITAL_COMMUNITY): Payer: 59 | Admitting: Physical Therapy

## 2019-10-26 ENCOUNTER — Encounter (HOSPITAL_COMMUNITY): Payer: Self-pay | Admitting: Physical Therapy

## 2019-10-26 ENCOUNTER — Other Ambulatory Visit: Payer: Self-pay

## 2019-10-26 DIAGNOSIS — M542 Cervicalgia: Secondary | ICD-10-CM

## 2019-10-26 DIAGNOSIS — M5412 Radiculopathy, cervical region: Secondary | ICD-10-CM | POA: Diagnosis not present

## 2019-10-26 DIAGNOSIS — R293 Abnormal posture: Secondary | ICD-10-CM

## 2019-10-26 NOTE — Therapy (Signed)
[place], [place], [address] Phone: [phone]   Fax:  [phone]  Physical Therapy Treatment  Patient Details  Name: Amber Russell MRN: [id number] Date of Birth: [birth year] Referring Provider (PT): Arther Abbott MD   Encounter Date: 10/26/2019  PT End of Session - 10/26/19 1352    Visit Number  8    Number of Visits  12    Date for PT Re-Evaluation  11/11/19   Progress note done 10/24/19   Authorization Type  [employer] (30 visit limit) no auth req    Authorization Time Period  09/26/19- 11/11/19    Authorization - Visit Number  8    Authorization - Number of Visits  30    PT Start Time  1350    PT Stop Time  1433    PT Time Calculation (min)  43 min    Activity Tolerance  Patient tolerated treatment well;Patient limited by fatigue    Behavior During Therapy  [facility] for tasks assessed/performed       Past Medical History:  Diagnosis Date  . Contraceptive management 02/15/2015  . GERD without esophagitis   . Hep C w/o coma, chronic ([place])   . Hx of migraines   . Nexplanon removal 02/15/2015    Past Surgical History:  Procedure Laterality Date  . BILE DUCT EXPLORATION N/A   . CESAREAN SECTION    . CESAREAN SECTION     age 38  . CHOLECYSTECTOMY    . KNEE ARTHROPLASTY      There were no vitals filed for this visit.  Subjective Assessment - 10/26/19 1353    Subjective  Patient says she is doing well today, reports no new issues since last visit. Currently reports no pain.    Limitations  Sitting;Reading;Lifting;House hold activities    Currently in Pain?  No/denies                       [facility] Adult PT Treatment/Exercise - 10/26/19 0001      Neck Exercises: Seated   Other Seated Exercise  seated upper trap/ levator stretching 2 x 30" each       Shoulder Exercises: Seated   Other Seated Exercises  Thoracic extension and bilateral rotation excursions, 5 x each       Shoulder  Exercises: Standing   Horizontal ABduction  Both;20 reps;Theraband    Theraband Level (Shoulder Horizontal ABduction)  Level 3 (Green)    Extension  Both;20 reps;Theraband    Theraband Level (Shoulder Extension)  Level 4 (Blue)    Row  Both;20 reps;Theraband    Theraband Level (Shoulder Row)  Level 4 (Blue)    Other Standing Exercises  Green band PNF D2 x10 each       Shoulder Exercises: Stretch   Corner Stretch  3 reps;20 seconds      Manual Therapy   Manual Therapy  Manual Traction;Other (comment);Soft tissue mobilization    Manual therapy comments  Manual complete separate than rest of tx    Soft tissue mobilization  STM to bilateral sub occipitals    Manual Traction  4 x 60"    Other Manual Therapy  suboccipital release 3 x 60"               PT Short Term Goals - 10/24/19 1427      PT SHORT TERM GOAL #1   Title  Patient will be independent with initial HEP to improve  functional outcomes    Time  3    Period  Weeks    Status  Achieved    Target Date  10/21/19        PT Long Term Goals - 10/24/19 1427      PT LONG TERM GOAL #1   Title  Patient will improve FOTO score to <30% to indicate improvement in functional outcomes    Baseline  cuurent: 33% limited    Time  6    Period  Weeks    Status  On-going      PT LONG TERM GOAL #2   Title  Patient will report at least 75% overall improvement in subjective complaint to indicate improvement in ability to perform ADLs.    Baseline  current: 50%    Time  6    Period  Weeks    Status  On-going      PT LONG TERM GOAL #3   Title  Patient will improve pain free cervical extension by at least 10 degress in order to perform overhead ADLs safely    Baseline  current: 47 dg    Time  6    Period  Weeks    Status  Achieved      PT LONG TERM GOAL #4   Title  Patient will have equal to or > 4+/5 MMT throughout BUE to improve ability to perform UE ADLs, driving, lifting    Time  6    Period  Weeks    Status  Achieved             Plan - 10/26/19 1434    Clinical Impression Statement  Patient was able to progress band rows and extension to blue resistance with no increased complaint of pain. Attempted to progress band exercise to GTB PNF D2. Patient had significant difficulty and limitation on LT side and was unable to perform through full ROM, even with verbal cues for form. Patient educated on proper form and function of added upper trap and levator stretching.    Examination-Activity Limitations  Lift;Reach Overhead;Caring for Others;Carry    Examination-Participation Restrictions  Cleaning;Yard Work;Community Activity;Driving;Laundry    Stability/Clinical Decision Making  Stable/Uncomplicated    Rehab Potential  Good    PT Frequency  2x / week    PT Duration  6 weeks    PT Treatment/Interventions  ADLs/Self Care Home Management;Biofeedback;Therapeutic exercise;Therapeutic activities;Patient/family education;Moist Heat;Neuromuscular re-education;DME Instruction;Manual techniques;Taping;Joint Manipulations;Spinal Manipulations;Passive range of motion    PT Next Visit Plan  Continue to progress postural and scapular strengthening as tolerated.    PT Home Exercise Plan  09/26/19: scap retraction, chin tucks; 10/10/19: corner stretch, thoracic extension, rotation stretching       Patient will benefit from skilled therapeutic intervention in order to improve the following deficits and impairments:  Pain, Decreased activity tolerance, Decreased range of motion, Decreased strength, Hypomobility, Impaired flexibility  Visit Diagnosis: Radiculopathy, cervical region  Abnormal posture  Cervicalgia     Problem List Patient Active Problem List   Diagnosis Date Noted  . Cough, persistent 12/14/2018  . Migraines, neuralgic 12/14/2018  . Vitamin D deficiency, unspecified 12/14/2018  . Fatigue 12/14/2018  . Perennial and seasonal allergic rhinitis 01/29/2016  . Seasonal allergic conjunctivitis 01/29/2016   . Moderate persistent asthma with mild exacerbation 01/29/2016  . DUB (dysfunctional uterine bleeding) 06/10/2013  . Dizziness 12/27/2012  . TMJ arthralgia 12/27/2012  . Varicose veins of lower extremity with inflammation 06/16/2012  . Chronic hepatitis C ([place]) 06/06/2011  2:38 PM, 10/26/19 Josue Hector PT DPT  Physical Therapist with [facility]  [phone]   [facility] [address] [place], [place], [address] Phone: [phone]   Fax:  [phone]  Name: Amber Russell MRN: [id number] Date of Birth: 10-11-1970

## 2019-10-28 ENCOUNTER — Encounter (HOSPITAL_COMMUNITY): Payer: 59

## 2019-10-31 ENCOUNTER — Ambulatory Visit (HOSPITAL_COMMUNITY): Payer: 59 | Admitting: Physical Therapy

## 2019-10-31 ENCOUNTER — Telehealth (HOSPITAL_COMMUNITY): Payer: Self-pay | Admitting: Physical Therapy

## 2019-10-31 NOTE — Telephone Encounter (Signed)
cx due to weather

## 2019-11-02 ENCOUNTER — Ambulatory Visit (HOSPITAL_COMMUNITY): Payer: 59 | Admitting: Physical Therapy

## 2019-11-04 ENCOUNTER — Telehealth (HOSPITAL_COMMUNITY): Payer: Self-pay | Admitting: Physical Therapy

## 2019-11-04 NOTE — Telephone Encounter (Signed)
pt cancelled appt on 2/22 because she has a lot to do

## 2019-11-07 ENCOUNTER — Ambulatory Visit (HOSPITAL_COMMUNITY): Payer: 59 | Admitting: Physical Therapy

## 2019-11-09 ENCOUNTER — Ambulatory Visit (HOSPITAL_COMMUNITY): Payer: 59 | Admitting: Physical Therapy

## 2019-11-14 ENCOUNTER — Ambulatory Visit (HOSPITAL_COMMUNITY): Payer: 59 | Attending: Orthopedic Surgery | Admitting: Physical Therapy

## 2019-11-14 ENCOUNTER — Other Ambulatory Visit: Payer: Self-pay

## 2019-11-14 ENCOUNTER — Encounter (HOSPITAL_COMMUNITY): Payer: Self-pay | Admitting: Physical Therapy

## 2019-11-14 DIAGNOSIS — M542 Cervicalgia: Secondary | ICD-10-CM | POA: Insufficient documentation

## 2019-11-14 DIAGNOSIS — R293 Abnormal posture: Secondary | ICD-10-CM | POA: Insufficient documentation

## 2019-11-14 DIAGNOSIS — M5412 Radiculopathy, cervical region: Secondary | ICD-10-CM | POA: Insufficient documentation

## 2019-11-14 NOTE — Therapy (Signed)
[facility] [address] [place], [place], [address] Phone: [phone]   Fax:  [phone]  Physical Therapy Treatment/ Discharge Summary  Patient Details  Name: Amber Russell MRN: [id number] Date of Birth: 09/12/1971 Referring Provider (PT): Arther Abbott MD   Encounter Date: 11/14/2019 PHYSICAL THERAPY DISCHARGE SUMMARY  Visits from Start of Care: 9  Current functional level related to goals / functional outcomes: See below    Remaining deficits: See below    Education / Equipment: See assessment  Plan: Patient agrees to discharge.  Patient goals were partially met. Patient is being discharged due to being pleased with the current functional level.  ?????      PT End of Session - 11/14/19 1312    Visit Number  9    Number of Visits  12    Date for PT Re-Evaluation  11/11/19   Progress note done 10/24/19   Authorization Type  [facility] (30 visit limit) no auth req    Authorization Time Period  09/26/19- 11/11/19; 11/14/19    Authorization - Visit Number  9    Authorization - Number of Visits  30    PT Start Time  1300    PT Stop Time  1400    PT Time Calculation (min)  60 min    Activity Tolerance  Patient tolerated treatment well    Behavior During Therapy  WFL for tasks assessed/performed       Past Medical History:  Diagnosis Date  . Contraceptive management 02/15/2015  . GERD without esophagitis   . Hep C w/o coma, chronic ([place])   . Hx of migraines   . Nexplanon removal 02/15/2015    Past Surgical History:  Procedure Laterality Date  . BILE DUCT EXPLORATION N/A   . CESAREAN SECTION    . CESAREAN SECTION     age 49  . CHOLECYSTECTOMY    . KNEE ARTHROPLASTY      There were no vitals filed for this visit.  Subjective Assessment - 11/14/19 1311    Subjective  Patient returns to therapy after 3 week absence due to various issues and scheduling conflicts. Patient says her neck and shoulders have been  doing ok, "better than before" but still bother her to some degree. At this point, patient says she feels her neck issues are persistent and she would like to discharge form therapy to return to care of referring MD for follow up and possibly MRI imaging. Patient reports overall 60-70% improvement in sx since starting therapy.    Limitations  Sitting;Reading;Lifting;House hold activities    Currently in Pain?  No/denies         [facility] PT Assessment - 11/14/19 0001      Assessment   Medical Diagnosis  cervical radiculopathy bilateral    Referring Provider (PT)  Arther Abbott MD    Prior Therapy  yes in 2018       Precautions   Precautions  None      Restrictions   Weight Bearing Restrictions  No      [place] residence      Cognition   Overall Cognitive Status  Within Functional Limits for tasks assessed      Observation/Other Assessments   Focus on Therapeutic Outcomes (FOTO)   35% limited    was 33%     AROM   Cervical Extension  55    Cervical - Right Side Bend  28  Cervical - Left Side Bend  34    Cervical - Right Rotation  68    Cervical - Left Rotation  74      Strength   Right Shoulder Flexion  5/5    Right Shoulder ABduction  5/5    Right Shoulder Internal Rotation  5/5    Right Shoulder External Rotation  5/5    Left Shoulder Flexion  4+/5    Left Shoulder ABduction  4+/5   mild pain    Left Shoulder Internal Rotation  5/5    Left Shoulder External Rotation  5/5                   [facility] Adult PT Treatment/Exercise - 11/14/19 0001      Neck Exercises: Seated   Other Seated Exercise  Seated chin tuck x20, chin tuck with overpressure x20, chin tuck with self towel mob x 10              PT Education - 11/14/19 1311    Education Details  on reassessment finding, cervical spine anatomy, HEP and DC status    Person(s) Educated  Patient    Methods  Explanation;Handout    Comprehension  Verbalized  understanding       PT Short Term Goals - 10/24/19 1427      PT SHORT TERM GOAL #1   Title  Patient will be independent with initial HEP to improve functional outcomes    Time  3    Period  Weeks    Status  Achieved    Target Date  10/21/19        PT Long Term Goals - 11/14/19 1414      PT LONG TERM GOAL #1   Title  Patient will improve FOTO score to <30% to indicate improvement in functional outcomes    Baseline  current: 35% limited    Time  6    Period  Weeks    Status  Not Met      PT LONG TERM GOAL #2   Title  Patient will report at least 75% overall improvement in subjective complaint to indicate improvement in ability to perform ADLs.    Baseline  current: 60-70%    Time  6    Period  Weeks    Status  Not Met      PT LONG TERM GOAL #3   Title  Patient will improve pain free cervical extension by at least 10 degress in order to perform overhead ADLs safely    Baseline  MET    Time  6    Period  Weeks    Status  Achieved      PT LONG TERM GOAL #4   Title  Patient will have equal to or > 4+/5 MMT throughout BUE to improve ability to perform UE ADLs, driving, lifting    Time  6    Period  Weeks    Status  Achieved            Plan - 11/14/19 1407    Clinical Impression Statement  Patient has made good progress to LTGs, despite recent absence from therapy for about 3 weeks. Patient has been compliant with HEP and has continued to make progress. Patient has made good improvement in shoulder strength and cervical pain free ROM. Patient is still limited by mild pain with LT shoulder flexion to end range, and mild LT scapular instability/ fatigue. Patient is being DC today  per patient request, to return to care of referring provider for further evaluation. Patient was educated in depth on comprehensive HEP for continued improvement, and on postural awareness. Patient verbalized understanding and returned demonstration. Patient issued update HEP handout, and  instructed to follow up with physical therapy services with any further questions or concerns.   Examination-Activity Limitations  Lift;Reach Overhead;Caring for Others;Carry    Examination-Participation Restrictions  Cleaning;Yard Work;Community Activity;Driving;Laundry    Stability/Clinical Decision Making  Stable/Uncomplicated    Rehab Potential  Good    PT Frequency  --    PT Duration  --    PT Treatment/Interventions  ADLs/Self Care Home Management;Biofeedback;Therapeutic exercise;Therapeutic activities;Patient/family education;Moist Heat;Neuromuscular re-education;DME Instruction;Manual techniques;Taping;Joint Manipulations;Spinal Manipulations;Passive range of motion    PT Next Visit Plan  DC to home program    PT Home Exercise Plan  09/26/19: scap retraction, chin tucks; 10/10/19: corner stretch, thoracic extension, rotation stretching    Consulted and Agree with Plan of Care  Patient       Patient will benefit from skilled therapeutic intervention in order to improve the following deficits and impairments:  Pain, Decreased activity tolerance, Decreased range of motion, Decreased strength, Hypomobility, Impaired flexibility  Visit Diagnosis: Radiculopathy, cervical region  Abnormal posture  Cervicalgia     Problem List Patient Active Problem List   Diagnosis Date Noted  . Cough, persistent 12/14/2018  . Migraines, neuralgic 12/14/2018  . Vitamin D deficiency, unspecified 12/14/2018  . Fatigue 12/14/2018  . Perennial and seasonal allergic rhinitis 01/29/2016  . Seasonal allergic conjunctivitis 01/29/2016  . Moderate persistent asthma with mild exacerbation 01/29/2016  . DUB (dysfunctional uterine bleeding) 06/10/2013  . Dizziness 12/27/2012  . TMJ arthralgia 12/27/2012  . Varicose veins of lower extremity with inflammation 06/16/2012  . Chronic hepatitis C ([place]) 06/06/2011   2:22 PM, 11/14/19 Josue Hector PT DPT  Physical Therapist with [facility]  [phone] [place] [address] [place], [place], [address] Phone: [phone]   Fax:  [phone]  Name: Kennidi Yoshida MRN: [id number] Date of Birth: 1970-11-29

## 2019-11-14 NOTE — Patient Instructions (Signed)
Access Code: [email or web address]  URL: [email or web address]  Date: 11/14/2019  Prepared by: Josue Hector   Exercises Correct Seated Posture - 10 reps - 1 sets - 1x daily - 7x weekly Seated Cervical Retraction - 10 reps - 2 sets - 3x daily - 7x weekly Seated Passive Cervical Retraction - 10 reps - 2 sets - 3x daily - 7x weekly Seated Scapular Retraction - 10 reps - 2 sets - 2x daily - 7x weekly Seated Gentle Upper Trapezius Stretch - 3 reps - 1 sets - 30 hold - 2x daily - 7x weekly Gentle Levator Scapulae Stretch - 3 reps - 1 sets - 30 hold - 2x daily - 7x weekly Doorway Pec Stretch at 90 Degrees Abduction - 3 reps - 1 sets - 30 hold - 2x daily - 7x weekly Seated Thoracic Extension Arms Overhead - 10 reps - 1 sets - 2x daily - 7x weekly Seated Thoracic Flexion and Rotation with Arms Crossed - 10 reps - 1 sets - 2x daily - 7x weekly Shoulder External Rotation and Scapular Retraction with Resistance - 10 reps - 2 sets - 1x daily - 3x weekly Standing Shoulder Horizontal Abduction with Resistance - 10 reps - 2 sets - 1x daily - 3x weekly Standing Shoulder Diagonal Horizontal Abduction 60/120 Degrees with Resistance - 10 reps - 2 sets - 1x daily - 3x weekly Standing Row with Anchored Resistance - 10 reps - 2 sets - 1x daily - 3x weekly Shoulder Extension with Resistance - 10 reps - 2 sets - 1x daily - 3x weekly

## 2019-12-01 ENCOUNTER — Telehealth: Payer: Self-pay | Admitting: Orthopaedic Surgery

## 2019-12-01 NOTE — Telephone Encounter (Signed)
Patient called to relay that she has completed physical therapy. Please advise if she may re-try for MRI or if a follow up office vistt is to be scheduled.

## 2019-12-02 NOTE — Telephone Encounter (Signed)
Needs appointment

## 2019-12-05 NOTE — Telephone Encounter (Signed)
Called back to patient to schedule.

## 2019-12-19 NOTE — Telephone Encounter (Signed)
Appointment scheduled; aware.

## 2019-12-28 ENCOUNTER — Ambulatory Visit (INDEPENDENT_AMBULATORY_CARE_PROVIDER_SITE_OTHER): Payer: 59 | Admitting: Orthopedic Surgery

## 2019-12-28 ENCOUNTER — Other Ambulatory Visit: Payer: Self-pay

## 2019-12-28 ENCOUNTER — Encounter: Payer: Self-pay | Admitting: Orthopedic Surgery

## 2019-12-28 VITALS — Ht 65.0 in | Wt 140.0 lb

## 2019-12-28 DIAGNOSIS — M4722 Other spondylosis with radiculopathy, cervical region: Secondary | ICD-10-CM | POA: Diagnosis not present

## 2019-12-28 DIAGNOSIS — M542 Cervicalgia: Secondary | ICD-10-CM

## 2019-12-28 DIAGNOSIS — G8929 Other chronic pain: Secondary | ICD-10-CM

## 2019-12-28 DIAGNOSIS — M25512 Pain in left shoulder: Secondary | ICD-10-CM

## 2019-12-28 NOTE — Progress Notes (Signed)
Chief Complaint  Patient presents with  . Neck Pain    down left arm/ a little better with physical therapy     49 years old just came back from therapy for cervical spondylosis with radiculopathy and trigger point.  She had the therapy she says is a little better but still has that one little area left shoulder superior border the scapula still hurts hurts with range of motion  I reexamined her today she has good cuff strength her shoulder is stable but she is point tender in that spot  Recommend injection she approved  Trigger point injection left shoulder  Use heat Anti-inflammatories Check 6 weeks  TRIGGER POINT INJECTION  Patient consented verbally for injection of the left  posterior/MEDIAL scapula. Timeout confirmed the site of injection A steroid injection was performed at inferior border of the left scapula at the point of maximal tenderness using 1% plain Lidocaine and 40 mg of Depo-Medrol. This was well tolerated.

## 2019-12-28 NOTE — Patient Instructions (Addendum)
Trigger Point Injection Trigger points are areas where you have pain. A trigger point injection is a shot given in the trigger point to help relieve pain for a few days to a few months. Common places for trigger points include:  The neck.  The shoulders.  The upper back.  The lower back. A trigger point injection will not cure long-term (chronic) pain permanently. These injections do not always work for every person. For some people, they can help to relieve pain for a few days to a few months. Tell a health care provider about:  Any allergies you have.  All medicines you are taking, including vitamins, herbs, eye drops, creams, and over-the-counter medicines.  Any problems you or family members have had with anesthetic medicines.  Any blood disorders you have.  Any surgeries you have had.  Any medical conditions you have. What are the risks? Generally, this is a safe procedure. However, problems may occur, including:  Infection.  Bleeding or bruising.  Allergic reaction to the injected medicine.  Irritation of the skin around the injection site. What happens before the procedure? Ask your health care provider about:  Changing or stopping your regular medicines. This is especially important if you are taking diabetes medicines or blood thinners.  Taking medicines such as aspirin and ibuprofen. These medicines can thin your blood. Do not take these medicines unless your health care provider tells you to take them.  Taking over-the-counter medicines, vitamins, herbs, and supplements. What happens during the procedure?   Your health care provider will feel for trigger points. A marker may be used to circle the area for the injection.  The skin over the trigger point will be washed with a germ-killing (antiseptic) solution.  A thin needle is used for the injection. You may feel pain or a twitching feeling when the needle enters the trigger point.  A numbing solution may  be injected into the trigger point. Sometimes a medicine to keep down inflammation is also injected.  Your health care provider may move the needle around the area where the trigger point is located until the tightness and twitching goes away.  After the injection, your health care provider may put gentle pressure over the injection site.  The injection site will be covered with a bandage (dressing). The procedure may vary among health care providers and hospitals. What can I expect after treatment? After treatment, you may have:  Soreness and stiffness for 1-2 days.  A dressing. This can be taken off in a few hours or as told by your health care provider. Follow these instructions at home: Injection site care  Remove your dressing as told by your health care provider.  Check your injection site every day for signs of infection. Check for: ? Redness, swelling, or pain. ? Fluid or blood. ? Warmth. ? Pus or a bad smell. Managing pain, stiffness, and swelling  If directed, put ice on the affected area. ? Put ice in a plastic bag. ? Place a towel between your skin and the bag. ? Leave the ice on for 20 minutes, 2-3 times a day. General instructions  If you were asked to stop your regular medicines, ask your health care provider when you may start taking them again.  Return to your normal activities as told by your health care provider. Ask your health care provider what activities are safe for you.  Do not take baths, swim, or use a hot tub until your health care provider approves.    You may be asked to see an occupational or physical therapist for exercises that reduce muscle strain and stretch the area of the trigger point.  Keep all follow-up visits as told by your health care provider. This is important. Contact a health care provider if:  Your pain comes back, and it is worse than before the injection. You may need more injections.  You have chills or a fever.  The  injection site becomes more painful, red, swollen, or warm to the touch. Summary  A trigger point injection is a shot given in the trigger point to help relieve pain for a few days to a few months.  Common places for trigger point injections are the neck, shoulder, upper back, and lower back.  These injections do not always work for every person, but for some people, the injections can help to relieve pain for a few days to a few months.  Contact a health care provider if symptoms come back or they are worse than before treatment. Also, get help if the injection site becomes more painful, red, swollen, or warm to the touch. This information is not intended to replace advice given to you by your health care provider. Make sure you discuss any questions you have with your health care provider. Document Revised: 10/13/2018 Document Reviewed: 10/13/2018 Elsevier Patient Education  [place] ice to the injection site for the next 48 hours then heat with a heating pad for 30 minutes once or twice a day  Apply BenGay massage into the area once a day for the next 4 weeks  Follow-up in 5 weeks

## 2020-01-18 ENCOUNTER — Ambulatory Visit: Payer: 59 | Admitting: Orthopedic Surgery

## 2020-01-19 ENCOUNTER — Ambulatory Visit (INDEPENDENT_AMBULATORY_CARE_PROVIDER_SITE_OTHER): Payer: 59 | Admitting: Orthopedic Surgery

## 2020-01-19 ENCOUNTER — Other Ambulatory Visit: Payer: Self-pay

## 2020-01-19 VITALS — BP 124/74 | HR 76 | Temp 97.2°F | Ht 65.0 in | Wt 140.0 lb

## 2020-01-19 DIAGNOSIS — M542 Cervicalgia: Secondary | ICD-10-CM | POA: Diagnosis not present

## 2020-01-19 DIAGNOSIS — M4722 Other spondylosis with radiculopathy, cervical region: Secondary | ICD-10-CM | POA: Diagnosis not present

## 2020-01-19 NOTE — Patient Instructions (Signed)
Take ibuprofen 800 mg at a time   We are scheduling for MRI

## 2020-01-19 NOTE — Progress Notes (Addendum)
Chief Complaint  Patient presents with  . Follow-up    Recheck on left shoulder    49 year old female now has 2-year history of neck and shoulder pain with radiation of pain down the left arm and a trigger point in the left shoulder which is been unrelieved by heat anti-inflammatories trigger point injection and physical therapy  We recommend she increase her ibuprofen to 800 mg 3 times a day and that she get an MRI of the cervical spine  The images  Chief complaint neck pain  AP and lateral cervical spine  Cervical lordosis is slightly diminished.  Joint spaces appear to be normal.  At the C7-T1 facet joint there appears to be narrowing and increased bony sclerosis  On the AP view the C4 through 6 area appear to have sclerosis and narrowing consistent with uncovertebral joint degeneration  Impression mild spondylosis of the cervical spine  Encounter Diagnoses  Name Primary?  . Cervicalgia   . Cervical spondylosis with radiculopathy Yes   Chronic problem with exacerbation, MRI to consider whether the patient is a candidate for epidural steroids or needs surgery

## 2020-01-24 ENCOUNTER — Telehealth: Payer: Self-pay | Admitting: Radiology

## 2020-01-24 NOTE — Telephone Encounter (Signed)
[facility] needs Amber Russell note to state she is being considered for [facility] or surgical intervention of Amber Russell neck before they will approve Amber Russell MRI ?  Will you addend Amber Russell note from last office visit to reflect this, please?

## 2020-01-24 NOTE — Telephone Encounter (Signed)
ok 

## 2020-01-25 NOTE — Telephone Encounter (Signed)
Resent note with addendum to appeals dept

## 2020-02-16 ENCOUNTER — Ambulatory Visit (HOSPITAL_COMMUNITY): Payer: 59

## 2020-02-17 ENCOUNTER — Ambulatory Visit (HOSPITAL_COMMUNITY)
Admission: RE | Admit: 2020-02-17 | Discharge: 2020-02-17 | Disposition: A | Discharge status: Home / Self Care | Payer: 59 | Source: Ambulatory Visit | Attending: Orthopedic Surgery | Admitting: Orthopedic Surgery

## 2020-02-17 ENCOUNTER — Other Ambulatory Visit: Payer: Self-pay

## 2020-02-17 DIAGNOSIS — M542 Cervicalgia: Secondary | ICD-10-CM | POA: Insufficient documentation

## 2020-02-21 NOTE — Progress Notes (Signed)
Spoke with this patient regarding her MRI she has several small central disc protrusions a tiny right paracentral disc protrusion; she wants to think about possible epidural injections and will call us back

## 2020-03-14 ENCOUNTER — Ambulatory Visit (INDEPENDENT_AMBULATORY_CARE_PROVIDER_SITE_OTHER): Payer: 59 | Admitting: Obstetrics and Gynecology

## 2020-03-14 ENCOUNTER — Encounter: Payer: Self-pay | Admitting: Obstetrics and Gynecology

## 2020-03-14 ENCOUNTER — Other Ambulatory Visit (HOSPITAL_COMMUNITY)
Admission: RE | Admit: 2020-03-14 | Discharge: 2020-03-14 | Disposition: A | Discharge status: Home / Self Care | Payer: 59 | Source: Ambulatory Visit | Attending: Obstetrics and Gynecology | Admitting: Obstetrics and Gynecology

## 2020-03-14 ENCOUNTER — Other Ambulatory Visit: Payer: Self-pay

## 2020-03-14 ENCOUNTER — Encounter (INDEPENDENT_AMBULATORY_CARE_PROVIDER_SITE_OTHER): Payer: Self-pay

## 2020-03-14 VITALS — BP 132/77 | HR 59 | Ht 65.0 in | Wt 140.0 lb

## 2020-03-14 DIAGNOSIS — Z113 Encounter for screening for infections with a predominantly sexual mode of transmission: Secondary | ICD-10-CM | POA: Insufficient documentation

## 2020-03-14 DIAGNOSIS — R1031 Right lower quadrant pain: Secondary | ICD-10-CM

## 2020-03-14 DIAGNOSIS — N951 Menopausal and female climacteric states: Secondary | ICD-10-CM

## 2020-03-14 NOTE — Addendum Note (Signed)
Addended by: Armond Hang on: 03/14/2020 02:50 PM   Modules accepted: Orders

## 2020-03-14 NOTE — Progress Notes (Signed)
PATIENT ID: Amber Russell, female     DOB: Dec 21, 1970, 49 y.o.     MRN: [id number]   [facility] Visit  03/14/20     PATIENT NAME: Amber Russell     MRN [id number]     DOB: 11/08/1970  CC & HPI:   Chief Complaint  Patient presents with  . Lower Right Abdominal Pain    Vaginal Discharge   Amber Russell is a 49 y.o. female presenting today for lower right abdominal pain accompanied by vaginal discharge.   She states that the pain is intermittent and that it feels like its putting pressure on her uterus and she states that "she feels like something is coming out like a baby." The pain in her abdominal RLQ correlates to her pressure. She also notes that she has recently developed a lot of light yellow discharge as well.   ROS:  Review of Systems  Constitutional: Negative.   HENT: Negative.   Eyes: Negative.   Respiratory: Negative.   Cardiovascular: Negative.   Gastrointestinal: Positive for abdominal pain (RLQ).  Genitourinary: Negative.   Musculoskeletal: Negative.   Skin: Negative.   Neurological: Negative.   Endo/Heme/Allergies: Negative.   Psychiatric/Behavioral: Negative.   All other systems reviewed and are negative.   Pertinent History Reviewed:  Reviewed: Significant for l Medical         Past Medical History:  Diagnosis Date  . Contraceptive management 02/15/2015  . GERD without esophagitis   . Hep C w/o coma, chronic ([place])   . Hx of migraines   . Nexplanon removal 02/15/2015                              Surgical Hx:    Past Surgical History:  Procedure Laterality Date  . BILE DUCT EXPLORATION N/A   . CESAREAN SECTION    . CESAREAN SECTION     age 46  . CHOLECYSTECTOMY    . KNEE ARTHROPLASTY     Medications: Reviewed & Updated - see associated section                       Current Outpatient Medications:  .  Cholecalciferol (VITAMIN D3) 50000 units CAPS, TK 1 C PO Q WEEK, Disp: , Rfl: 0 .  clobetasol cream (TEMOVATE) 7.90 %, Apply 1  application topically 3 (three) times a week. To irritated tissues, Disp: 30 g, Rfl: 0 .  cyclobenzaprine (FLEXERIL) 5 MG tablet, Take 1 tablet (5 mg total) by mouth 3 (three) times daily as needed for muscle spasms., Disp: 30 tablet, Rfl: 0 .  gabapentin (NEURONTIN) 100 MG capsule, Take 1 capsule (100 mg total) by mouth 3 (three) times daily., Disp: 90 capsule, Rfl: 2 .  ibuprofen (ADVIL) 200 MG tablet, Take 200 mg by mouth every 6 (six) hours as needed., Disp: , Rfl:  .  levocetirizine (XYZAL) 5 MG tablet, Take 5 mg by mouth at bedtime., Disp: , Rfl:  .  levonorgestrel (MIRENA) 20 MCG/24HR IUD, by Intrauterine route., Disp: , Rfl:  .  NURTEC 75 MG TBDP, Take 1 tablet by mouth daily as needed., Disp: , Rfl:  .  nystatin-triamcinolone ointment (MYCOLOG), Apply 1 application topically 2 (two) times daily., Disp: 30 g, Rfl: 0 .  rizatriptan (MAXALT) 10 MG tablet, TK 1 T PO Q 2 HOURS. MAX 3 PER DAY, Disp: , Rfl: 1 .  topiramate (TOPAMAX) 50 MG tablet, Take  50 mg by mouth 2 (two) times daily., Disp: , Rfl:   Current Facility-Administered Medications:  .  predniSONE (DELTASONE) tablet 10 mg, 10 mg, Oral, UD, Bobbitt, [place] Muta, MD   Social History: Reviewed -  reports that she has never smoked. She has never used smokeless tobacco.  Objective Findings:  Vitals: Blood pressure 132/77, pulse (!) 59, height 5\' 5"  (1.651 m), weight 140 lb (63.5 kg), unknown if currently breastfeeding.  PHYSICAL EXAMINATION General appearance - alert, well appearing, and in no distress, oriented to person, place, and time, normal appearing weight and well hydrated Mental status - alert, oriented to person, place, and time, normal mood, behavior, speech, dress, motor activity, and thought processes, affect appropriate to mood Chest - not examined Heart - not examined Abdomen - soft, nontender, nondistended, no masses or organomegaly Breasts - not examined Skin - normal coloration and turgor, no rashes, no  suspicious skin lesions noted  PELVIC External genitalia - cracking present in posterior fourchette Vulva - normal Vagina - thinning tissues, Cervix - tiny  IUD string present  Uterus - good support, IUD string visible, tenderness Adnexa - firm in right adnexa, but no mass [place] - no discharge Rectal - rectal exam not indicated  Assessment & Plan:   A:  1.  postmenopause? 2.   P:  1. Schedule TV U/S 2. Labs today [facility]  estradiol  By signing my name below, I, [employer], attest that this documentation has been prepared under the direction and in the presence of Jonnie Kind, MD. Electronically Signed: [place]. 03/14/20. 2:19 PM.  I personally performed the services described in this documentation, which was SCRIBED in my presence. The recorded information has been reviewed and considered accurate. It has been edited as necessary during review. Jonnie Kind, MD

## 2020-03-15 ENCOUNTER — Telehealth: Payer: Self-pay | Admitting: Obstetrics and Gynecology

## 2020-03-15 LAB — ESTRADIOL: Estradiol: 286 pg/mL

## 2020-03-15 LAB — FOLLICLE STIMULATING HORMONE: FSH: 6.9 m[IU]/mL

## 2020-03-15 NOTE — Telephone Encounter (Signed)
Patient wants someone to call her because she has her lab results and need to ask a question.

## 2020-03-15 NOTE — Progress Notes (Signed)
Both results indicate you are premenopausal. The IUD is still useful

## 2020-03-16 LAB — CERVICOVAGINAL ANCILLARY ONLY
Chlamydia: NEGATIVE
Comment: NEGATIVE
Comment: NEGATIVE
Comment: NORMAL
Neisseria Gonorrhea: NEGATIVE
Trichomonas: NEGATIVE

## 2020-03-17 NOTE — Telephone Encounter (Signed)
As I understand the labs, I believe that you are Not Yet postmenopausal, so if the IUD is removed you will resume periods, and have a slight fertility risk.  So I would continue the IUD for a year or so, and recheck the labs

## 2020-03-23 ENCOUNTER — Other Ambulatory Visit: Payer: Self-pay | Admitting: Obstetrics and Gynecology

## 2020-03-23 DIAGNOSIS — R1031 Right lower quadrant pain: Secondary | ICD-10-CM

## 2020-03-23 DIAGNOSIS — N951 Menopausal and female climacteric states: Secondary | ICD-10-CM

## 2020-03-28 ENCOUNTER — Other Ambulatory Visit: Payer: Self-pay

## 2020-03-28 ENCOUNTER — Ambulatory Visit (INDEPENDENT_AMBULATORY_CARE_PROVIDER_SITE_OTHER): Payer: 59

## 2020-03-28 ENCOUNTER — Other Ambulatory Visit: Payer: Self-pay | Admitting: Obstetrics and Gynecology

## 2020-03-28 DIAGNOSIS — N83201 Unspecified ovarian cyst, right side: Secondary | ICD-10-CM

## 2020-03-28 DIAGNOSIS — R1031 Right lower quadrant pain: Secondary | ICD-10-CM

## 2020-03-28 DIAGNOSIS — N83202 Unspecified ovarian cyst, left side: Secondary | ICD-10-CM

## 2020-03-28 DIAGNOSIS — N951 Menopausal and female climacteric states: Secondary | ICD-10-CM

## 2020-03-28 NOTE — Progress Notes (Signed)
PELVIC US TA/TV:heterogeneous anteverted uterus,with mult small fibroids,(#1) fundal intramural fibroid 2 x 1.8 x 1.8 cm,(#2) mid left intramural fibroid 1.2 x 1.6 x 1 cm,IUD is centrally located within the uterus,EEC 8 mm,simple left ovarian cyst 3.7 x 3.1 x 3.2 cm,simple right ovarian cyst 5.1 x 5.3 x 3.2 cm,arterial and venous flow visualized in both ovaries,ovaries appear mobile,no pain during ultrasound   Chaperone Peggy

## 2020-04-05 ENCOUNTER — Ambulatory Visit (INDEPENDENT_AMBULATORY_CARE_PROVIDER_SITE_OTHER): Payer: 59 | Admitting: Obstetrics and Gynecology

## 2020-04-05 ENCOUNTER — Encounter: Payer: Self-pay | Admitting: Obstetrics and Gynecology

## 2020-04-05 VITALS — BP 119/74 | HR 61 | Ht 65.0 in | Wt 142.0 lb

## 2020-04-05 DIAGNOSIS — N83292 Other ovarian cyst, left side: Secondary | ICD-10-CM

## 2020-04-05 DIAGNOSIS — N83299 Other ovarian cyst, unspecified side: Secondary | ICD-10-CM

## 2020-04-05 DIAGNOSIS — R102 Pelvic and perineal pain: Secondary | ICD-10-CM

## 2020-04-05 DIAGNOSIS — D259 Leiomyoma of uterus, unspecified: Secondary | ICD-10-CM | POA: Diagnosis not present

## 2020-04-05 DIAGNOSIS — N83291 Other ovarian cyst, right side: Secondary | ICD-10-CM | POA: Diagnosis not present

## 2020-04-05 MED ORDER — CLOBETASOL PROPIONATE 0.05 % EX CREA: 1.0000 "application " | TOPICAL_CREAM | CUTANEOUS | 0 refills | Status: DC

## 2020-04-05 NOTE — Progress Notes (Signed)
Patient ID: Amber Russell, female   DOB: 08/02/1971, 49 y.o.   MRN: [id number]    [facility] Visit  @DATE @            Patient name: Amber Russell MRN [id number]  Date of birth: 03/12/1971  CC & HPI:  Amber Russell is a 49 y.o. female presenting today to review the results from her ultrasound on 03/28/2020, which was completed for intermittent RLQ pain. She reports that she has intermittent abdominal pain that feels like "when a baby is coming." The patient denies fever, chills or any other symptoms or complaints at this time.   ROS:  ROS  - intermittent abdominal pain - fever - chills All systems are negative except as noted in the HPI and PMH.   Pertinent History Reviewed:   Reviewed: Medical         Past Medical History:  Diagnosis Date  . Contraceptive management 02/15/2015  . GERD without esophagitis   . Hep C w/o coma, chronic ([place])   . Hx of migraines   . Nexplanon removal 02/15/2015                              Surgical Hx:    Past Surgical History:  Procedure Laterality Date  . BILE DUCT EXPLORATION N/A   . CESAREAN SECTION    . CESAREAN SECTION     age 79  . CHOLECYSTECTOMY    . KNEE ARTHROPLASTY     Medications: Reviewed & Updated - see associated section                       Current Outpatient Medications:  .  Cholecalciferol (VITAMIN D3) 50000 units CAPS, TK 1 C PO Q WEEK, Disp: , Rfl: 0 .  clobetasol cream (TEMOVATE) 7.82 %, Apply 1 application topically 3 (three) times a week. To irritated tissues, Disp: 30 g, Rfl: 0 .  cyclobenzaprine (FLEXERIL) 5 MG tablet, Take 1 tablet (5 mg total) by mouth 3 (three) times daily as needed for muscle spasms., Disp: 30 tablet, Rfl: 0 .  gabapentin (NEURONTIN) 100 MG capsule, Take 1 capsule (100 mg total) by mouth 3 (three) times daily., Disp: 90 capsule, Rfl: 2 .  ibuprofen (ADVIL) 800 MG tablet, Take by mouth at bedtime., Disp: , Rfl:  .  levocetirizine (XYZAL) 5 MG tablet, Take 5 mg by mouth at bedtime.,  Disp: , Rfl:  .  levonorgestrel (MIRENA) 20 MCG/24HR IUD, by Intrauterine route., Disp: , Rfl:  .  NURTEC 75 MG TBDP, Take 1 tablet by mouth daily as needed., Disp: , Rfl:  .  nystatin-triamcinolone ointment (MYCOLOG), Apply 1 application topically 2 (two) times daily., Disp: 30 g, Rfl: 0 .  rizatriptan (MAXALT) 10 MG tablet, TK 1 T PO Q 2 HOURS. MAX 3 PER DAY, Disp: , Rfl: 1 .  topiramate (TOPAMAX) 50 MG tablet, Take 50 mg by mouth 2 (two) times daily., Disp: , Rfl:   Current Facility-Administered Medications:  .  predniSONE (DELTASONE) tablet 10 mg, 10 mg, Oral, UD, [place], [place] Muta, MD   Social History: Reviewed -  reports that she has never smoked. She has never used smokeless tobacco.  Objective Findings:  Vitals: Blood pressure 119/74, pulse 61, height 5\' 5"  (1.651 m), weight 142 lb (64.4 kg), last menstrual period 03/30/2020, not currently breastfeeding.  PHYSICAL EXAMINATION General appearance - alert, well appearing, and in no distress, oriented  to person, place, and time and normal appearing weight Mental status - alert, oriented to person, place, and time, normal mood, behavior, speech, dress, motor activity, and thought processes, affect appropriate to mood  PELVIC DEFERRED  Assessment & Plan:   A: 1. Reviewed Ultrasound imaging with the patient 2. 5 cm right ovarian cyst, small left ovarian cyst, small uterine fibroids 3. IUD positioned in uterus  P: 1. Recommended Ibuprofen 2. Refill Clobetasol 3. F/U prn  By signing my name below, I, De Burrs, attest that this documentation has been prepared under the direction and in the presence of Jonnie Kind, MD. Electronically Signed: De Burrs, Medical Scribe. 04/05/20. 3:21 PM.  I personally performed the services described in this documentation, which was SCRIBED in my presence. The recorded information has been reviewed and considered accurate. It has been edited as necessary during review. Jonnie Kind, MD

## 2020-06-04 ENCOUNTER — Other Ambulatory Visit (HOSPITAL_COMMUNITY)
Admission: RE | Admit: 2020-06-04 | Discharge: 2020-06-04 | Disposition: A | Discharge status: Home / Self Care | Payer: 59 | Source: Ambulatory Visit | Attending: Obstetrics & Gynecology | Admitting: Obstetrics & Gynecology

## 2020-06-04 ENCOUNTER — Ambulatory Visit (INDEPENDENT_AMBULATORY_CARE_PROVIDER_SITE_OTHER): Payer: 59 | Admitting: Women's Health

## 2020-06-04 ENCOUNTER — Encounter: Payer: Self-pay | Admitting: Women's Health

## 2020-06-04 VITALS — BP 130/68 | HR 62 | Ht 65.0 in | Wt 139.0 lb

## 2020-06-04 DIAGNOSIS — R102 Pelvic and perineal pain: Secondary | ICD-10-CM

## 2020-06-04 DIAGNOSIS — Z01419 Encounter for gynecological examination (general) (routine) without abnormal findings: Secondary | ICD-10-CM | POA: Diagnosis not present

## 2020-06-04 DIAGNOSIS — N898 Other specified noninflammatory disorders of vagina: Secondary | ICD-10-CM | POA: Insufficient documentation

## 2020-06-04 DIAGNOSIS — Z30430 Encounter for insertion of intrauterine contraceptive device: Secondary | ICD-10-CM | POA: Insufficient documentation

## 2020-06-04 NOTE — Progress Notes (Signed)
WELL-WOMAN EXAMINATION Patient name: Amber Russell MRN [id number]  Date of birth: 1970-10-02 Chief Complaint:   Annual Exam (physical only)  History of Present Illness:   Amber Russell is a 49 y.o. [phone] female being seen today for a routine well-woman exam.  Current complaints: continued intermittent pelvic pain, had pelvic u/s 03/28/20 for same which revealed 2 small fibroids and bilateral simple ovarian cysts, Lt 3.7cm, Rt 5.3cm.  Increased discharge, no odor, some mild itching.  Occ pain Lt upper outer breast for years, had u/s and mammo few years back and both were normal. Does drink a lot of caffeine (coffee).   Depression screen [facility] 2/9 06/04/2020 05/30/2019  Decreased Interest 0 0  Down, Depressed, Hopeless 0 1  PHQ - 2 Score 0 1  Altered sleeping 0 -  Tired, decreased energy 0 -  Change in appetite 0 -  Feeling bad or failure about yourself  0 -  Trouble concentrating 0 -  Moving slowly or fidgety/restless 0 -  Suicidal thoughts 0 -  PHQ-9 Score 0 -     PCP: Dr. Sherrie Sport      does not desire labs No LMP recorded. (Menstrual status: IUD). The current method of family planning is IUD, Mirena inserted 11/25/17  Last pap 05/30/19. Results were: normal. H/O abnormal pap: no Last mammogram: 06/17/19. Results were: normal. Family h/o breast cancer: no Last colonoscopy: never. Results were: N/A. Family h/o colorectal cancer: no Review of Systems:   Pertinent items are noted in HPI Denies any headaches, blurred vision, fatigue, shortness of breath, chest pain, abdominal pain, abnormal vaginal discharge/itching/odor/irritation, problems with periods, bowel movements, urination, or intercourse unless otherwise stated above. Pertinent History Reviewed:  Reviewed past medical,surgical, social and family history.  Reviewed problem list, medications and allergies. Physical Assessment:   Vitals:   06/04/20 1103  BP: 130/68  Pulse: 62  Weight: 139 lb (63 kg)  Height: 5\' 5"   (1.651 m)  Body mass index is 23.13 kg/m.        Physical Examination:   General appearance - well appearing, and in no distress  Mental status - alert, oriented to person, place, and time  Psych:  She has a normal mood and affect  Skin - warm and dry, normal color, no suspicious lesions noted  Chest - effort normal, all lung fields clear to auscultation bilaterally  Heart - normal rate and regular rhythm  Neck:  midline trachea, no thyromegaly or nodules  Breasts - breasts appear normal, no suspicious masses, no skin or nipple changes or  axillary nodes  Abdomen - soft, nontender, nondistended, no masses or organomegaly  Pelvic - VULVA: normal appearing vulva with no masses, tenderness or lesions  VAGINA: normal appearing vagina with normal color and discharge, no lesions  CERVIX: normal appearing cervix without discharge or lesions, no CMT, IUD strings visible  Thin prep pap is not done  UTERUS: uterus is felt to be normal size, shape, consistency and nontender   ADNEXA: No adnexal masses or tenderness noted.  Extremities:  No swelling or varicosities noted  Chaperone: [place]    No results found for this or any previous visit (from the past 24 hour(s)).  Assessment & Plan:  1) Well-Woman Exam  2) Intermittent pelvic pain> w/ known small fibroids and bilateral simple ovarian cysts (Lt 3.7cm, Rt 5.3cm in July). Will get repeat u/s 68mths from last to check on cysts  3) Increased vaginal d/c> CV swab sent  4) Intermittent Lt upper outer  breast pain> x years, normal u/s and mammo for these complaints few years ago, most recent mammo Oct 2020 normal. Drinks a lot of coffee, to cut back on caffeine to see if helps  Labs/procedures today: CV swab  Mammogram-gave [facility] radiology number to call to schedule screening mammo in Oct, or sooner if problems Colonoscopy-gave local GI numbers to call to get appt to schedule screening   No orders of the defined types were placed in this  encounter.   Meds: No orders of the defined types were placed in this encounter.   Follow-up: Return for after 10/14 for pelvic u/s and f/u w/ MD.  Roma Schanz CNM, WHNP-BC 06/04/2020 1:18 PM

## 2020-06-04 NOTE — Patient Instructions (Signed)
Call [phone] to schedule your mammogram for after 06/16/20  Gastro[facility] (GI) Doctors in [place]-call to schedule your colonoscopy  [facility] (Dr. Gala Romney & Dr. Oneida Alar) [phone]  Dr. Laural Golden [phone]

## 2020-06-04 NOTE — Addendum Note (Signed)
Addended by: Octaviano Glow on: 06/04/2020 04:19 PM   Modules accepted: Orders

## 2020-06-04 NOTE — Addendum Note (Signed)
Addended by: Octaviano Glow on: 06/04/2020 04:09 PM   Modules accepted: Orders

## 2020-06-06 LAB — CERVICOVAGINAL ANCILLARY ONLY
Bacterial Vaginitis (gardnerella): NEGATIVE
Candida Glabrata: NEGATIVE
Candida Vaginitis: NEGATIVE
Chlamydia: NEGATIVE
Comment: NEGATIVE
Comment: NEGATIVE
Comment: NEGATIVE
Comment: NEGATIVE
Comment: NEGATIVE
Comment: NORMAL
Neisseria Gonorrhea: NEGATIVE
Trichomonas: NEGATIVE

## 2020-06-11 ENCOUNTER — Telehealth: Payer: Self-pay | Admitting: *Deleted

## 2020-06-11 NOTE — Telephone Encounter (Signed)
Telephoned patient at home number and advised patient swab test was negative. Patient voiced understanding.

## 2020-06-25 ENCOUNTER — Other Ambulatory Visit: Payer: Self-pay | Admitting: *Deleted

## 2020-06-28 ENCOUNTER — Other Ambulatory Visit: Payer: Self-pay | Admitting: Women's Health

## 2020-06-28 DIAGNOSIS — D219 Benign neoplasm of connective and other soft tissue, unspecified: Secondary | ICD-10-CM

## 2020-06-28 DIAGNOSIS — N83202 Unspecified ovarian cyst, left side: Secondary | ICD-10-CM

## 2020-06-28 DIAGNOSIS — N83201 Unspecified ovarian cyst, right side: Secondary | ICD-10-CM

## 2020-06-29 ENCOUNTER — Other Ambulatory Visit: Payer: 59

## 2020-06-29 ENCOUNTER — Ambulatory Visit: Payer: 59 | Admitting: Obstetrics & Gynecology

## 2020-07-03 ENCOUNTER — Other Ambulatory Visit (HOSPITAL_COMMUNITY): Payer: Self-pay | Admitting: Obstetrics and Gynecology

## 2020-07-03 ENCOUNTER — Other Ambulatory Visit (HOSPITAL_COMMUNITY): Payer: Self-pay | Admitting: Internal Medicine

## 2020-07-03 DIAGNOSIS — Z1231 Encounter for screening mammogram for malignant neoplasm of breast: Secondary | ICD-10-CM

## 2020-07-11 ENCOUNTER — Ambulatory Visit (HOSPITAL_COMMUNITY)
Admission: RE | Admit: 2020-07-11 | Discharge: 2020-07-11 | Disposition: A | Discharge status: Home / Self Care | Payer: 59 | Source: Ambulatory Visit | Attending: Cardiology | Admitting: Cardiology

## 2020-07-11 ENCOUNTER — Other Ambulatory Visit: Payer: Self-pay

## 2020-07-11 ENCOUNTER — Ambulatory Visit (INDEPENDENT_AMBULATORY_CARE_PROVIDER_SITE_OTHER): Payer: 59

## 2020-07-11 ENCOUNTER — Encounter: Payer: Self-pay | Admitting: Adult Health

## 2020-07-11 ENCOUNTER — Ambulatory Visit (INDEPENDENT_AMBULATORY_CARE_PROVIDER_SITE_OTHER): Payer: 59 | Admitting: Adult Health

## 2020-07-11 VITALS — BP 124/75 | HR 57 | Ht 65.0 in | Wt 140.0 lb

## 2020-07-11 DIAGNOSIS — Z30432 Encounter for removal of intrauterine contraceptive device: Secondary | ICD-10-CM | POA: Diagnosis not present

## 2020-07-11 DIAGNOSIS — D219 Benign neoplasm of connective and other soft tissue, unspecified: Secondary | ICD-10-CM | POA: Diagnosis not present

## 2020-07-11 DIAGNOSIS — Z1231 Encounter for screening mammogram for malignant neoplasm of breast: Secondary | ICD-10-CM | POA: Diagnosis not present

## 2020-07-11 DIAGNOSIS — N83201 Unspecified ovarian cyst, right side: Secondary | ICD-10-CM

## 2020-07-11 DIAGNOSIS — N83202 Unspecified ovarian cyst, left side: Secondary | ICD-10-CM

## 2020-07-11 DIAGNOSIS — R102 Pelvic and perineal pain: Secondary | ICD-10-CM | POA: Diagnosis not present

## 2020-07-11 NOTE — Progress Notes (Signed)
PELVIC US TA/TV: Heterogeneous anteverted uterus with mult small fibroids N/C (#1) posterior fundal intramural fibroid 2.2 x 1.8 x 1.4 cm,(#2) left mid intramural fibroid 1.3 x 1.5 x .7 cm,EEC 5 mm,IUD is centrally located with in the endometrium,normal right ovary,simple left ovarian cyst 3.7 x 3.2 x 3.5 cm,ovaries appear mobile,no free fluid,bilateral adnexal pain during ultrasound  Chaperone Peggy

## 2020-07-11 NOTE — Progress Notes (Signed)
  Subjective:     Patient ID: Amber Russell, female   DOB: 04-Mar-1971, 49 y.o.   MRN: [id number]  HPI Amber Russell is a 49 year old female, married, [id number] in requesting IUD removal, has had pelvic pain and with history of ovarian cyst and yellow vaginal discharge, she had [name] this am PCP is Dr Sherrie Sport.  Review of Systems +pelvic pain History of cysts  History of yellow discharge Wants IUD removed Reviewed past medical,surgical, social and family history. Reviewed medications and allergies.     Objective:   Physical Exam BP 124/75 (BP Location: Left Arm, Patient Position: Sitting, Cuff Size: Normal)   Pulse (!) 57   Ht 5\' 5"  (1.651 m)   Wt 140 lb (63.5 kg)   LMP 07/10/2020   BMI 23.30 kg/m  Consent signed and time out called. Skin warm and dry.Pelvic: external genitalia is normal in appearance no lesions, vagina: +period blood without odor,urethra has no lesions or masses noted, cervix:smooth and bulbous,IUD strings seen and asked pt to cough and IUD easily removed, uterus: normal size, shape and contour, non tender, no masses felt, adnexa: no masses or tenderness noted. Bladder is non tender and no masses felt. Reviewed [name] with her and told her MD has not read yet and Maudie Mercury would get back to her, but US showed multiple small fibroids and resolved cyst on the right and simple cyst on the left, 3.7 x 3.2 x 3.5 cm    Upstream - 07/11/20 1107      Pregnancy Intention Screening   Does the patient want to become pregnant in the next year? No    Does the patient's partner want to become pregnant in the next year? No    Would the patient like to discuss contraceptive options today? No      Contraception Wrap Up   Current Method IUD or IUS    End Method No Method - Other Reason   prostate surgery   Contraception Counseling Provided No         Examination chaperoned by Levy Pupa LPN  Assessment:     1. Encounter for IUD removal  2. Fibroids  3. Cyst of left ovary  4. Pelvic pain  in female If pain persists call for appt.    Plan:     Follow up prn

## 2020-08-16 ENCOUNTER — Encounter (INDEPENDENT_AMBULATORY_CARE_PROVIDER_SITE_OTHER): Payer: Self-pay | Admitting: *Deleted

## 2021-05-23 ENCOUNTER — Encounter (INDEPENDENT_AMBULATORY_CARE_PROVIDER_SITE_OTHER): Payer: Self-pay | Admitting: *Deleted

## 2021-05-23 ENCOUNTER — Telehealth (INDEPENDENT_AMBULATORY_CARE_PROVIDER_SITE_OTHER): Payer: Self-pay

## 2021-05-23 ENCOUNTER — Encounter (INDEPENDENT_AMBULATORY_CARE_PROVIDER_SITE_OTHER): Payer: Self-pay

## 2021-05-23 ENCOUNTER — Other Ambulatory Visit (INDEPENDENT_AMBULATORY_CARE_PROVIDER_SITE_OTHER): Payer: Self-pay

## 2021-05-23 DIAGNOSIS — Z01812 Encounter for preprocedural laboratory examination: Secondary | ICD-10-CM

## 2021-05-23 DIAGNOSIS — Z1211 Encounter for screening for malignant neoplasm of colon: Secondary | ICD-10-CM

## 2021-05-23 MED ORDER — PEG 3350-KCL-NA BICARB-NACL 420 G PO SOLR: 4000.0000 mL | Freq: Once | ORAL | 0 refills | Status: AC

## 2021-05-23 NOTE — Telephone Encounter (Signed)
Amber Russell, [place]

## 2021-05-23 NOTE — Telephone Encounter (Signed)
Referring MD/PCP: Hasanaj  Procedure: Screening TCS   Reason/Indication:  Screening TCS  Has patient had this procedure before?  No  If so, when, by whom and where?    Is there a family history of colon cancer?  No  Who?  What age when diagnosed?    Is patient diabetic? If yes, Type 1 or Type 2   No      Does patient have prosthetic heart valve or mechanical valve?  No  Do you have a pacemaker/defibrillator?  No  Has patient ever had endocarditis/atrial fibrillation? No  Does patient use oxygen? No  Has patient had joint replacement within last 12 months?  No  Is patient constipated or do they take laxatives? No  Does patient have a history of alcohol/drug use?  No  Have you had a stroke/heart attack last 6 mths? No  Do you take medicine for weight loss?  No  For female patients,: do you still have your menstrual cycle? Yes  Is patient on blood thinner such as Coumadin, Plavix and/or Aspirin? No  Medications: Vit D 3 once per week,Flexeril prn, gabapentinTID,Xyzal Qhs, Nurtec 75 mg prn, maxalt prn,Valacyclovir 1000 mg BID  Allergies: NKDA  Medication Adjustment per Dr Rehman/Dr Jenetta Downer None  Procedure date & time: 07/03/2021

## 2021-06-10 ENCOUNTER — Ambulatory Visit (INDEPENDENT_AMBULATORY_CARE_PROVIDER_SITE_OTHER): Payer: 59 | Admitting: Adult Health

## 2021-06-10 ENCOUNTER — Encounter: Payer: Self-pay | Admitting: Adult Health

## 2021-06-10 ENCOUNTER — Other Ambulatory Visit: Payer: Self-pay

## 2021-06-10 VITALS — BP 125/75 | HR 70 | Ht 64.0 in | Wt 145.0 lb

## 2021-06-10 DIAGNOSIS — Z1211 Encounter for screening for malignant neoplasm of colon: Secondary | ICD-10-CM | POA: Diagnosis not present

## 2021-06-10 DIAGNOSIS — Z124 Encounter for screening for malignant neoplasm of cervix: Secondary | ICD-10-CM

## 2021-06-10 DIAGNOSIS — N951 Menopausal and female climacteric states: Secondary | ICD-10-CM

## 2021-06-10 DIAGNOSIS — Z8742 Personal history of other diseases of the female genital tract: Secondary | ICD-10-CM

## 2021-06-10 DIAGNOSIS — R102 Pelvic and perineal pain: Secondary | ICD-10-CM | POA: Diagnosis not present

## 2021-06-10 DIAGNOSIS — R232 Flushing: Secondary | ICD-10-CM

## 2021-06-10 DIAGNOSIS — Z01419 Encounter for gynecological examination (general) (routine) without abnormal findings: Secondary | ICD-10-CM

## 2021-06-10 LAB — HEMOCCULT GUIAC POC 1CARD (OFFICE): Fecal Occult Blood, POC: NEGATIVE

## 2021-06-10 NOTE — Progress Notes (Signed)
History of Present Illness: Amber Russell is a 50 year old biracial female,married, [phone], if for pelvic and breast exam. She has pain with sex and low pelvic area, ?in cervix. Had physical with PCP. Lab Results  Component Value Date   DIAGPAP  05/30/2019    NEGATIVE FOR INTRAEPITHELIAL LESIONS OR MALIGNANCY.   HPV NOT Detected 05/30/2019    PCP is Dr Sherrie Sport.   Current Medications, Allergies, Past Medical History, Past Surgical History, Family History and Social History were reviewed in [employer] record.     Review of Systems: Patient denies any daily headaches, hearing loss, fatigue, blurred vision, shortness of breath, chest pain, problems with bowel movements, urination. No joint pain or mood swings.  +pain with sex +pain in low pelvic area?cervix. Has history of left ovary cyst. +hot flashes   Physical Exam:BP 125/75 (BP Location: Right Arm, Patient Position: Sitting, Cuff Size: Normal)   Pulse 70   Ht 5\' 4"  (1.626 m)   Wt 145 lb (65.8 kg)   LMP 06/10/2021   BMI 24.89 kg/m   General:  Well developed, well nourished, no acute distress Skin:  Warm and dry Breast:  No dominant palpable mass, retraction, or nipple discharge Abdomen:  Soft, non tender, no hepatosplenomegaly Pelvic:  External genitalia is normal in appearance, no lesions.  The vagina is normal in appearance.+period blood. Urethra has no lesions or masses. The cervix is bulbous.  Uterus is felt to be normal size, shape, and contour.  No adnexal masses or tenderness noted.Bladder is non tender, no masses felt. Rectal: Good sphincter tone, no polyps, or hemorrhoids felt.  Hemoccult negative. Extremities/musculoskeletal:  No swelling or varicosities noted, no clubbing or cyanosis Psych:  No mood changes, alert and cooperative,seems happy AA is 0  Fall risk is low Depression screen [facility] 2/9 06/10/2021 06/04/2020 05/30/2019  Decreased Interest 0 0 0  Down, Depressed, Hopeless 0 0 1  PHQ - 2 Score 0 0  1  Altered sleeping 0 0 -  Tired, decreased energy 1 0 -  Change in appetite 0 0 -  Feeling bad or failure about yourself  0 0 -  Trouble concentrating 0 0 -  Moving slowly or fidgety/restless 0 0 -  Suicidal thoughts 0 0 -  PHQ-9 Score 1 0 -    GAD 7 : Generalized Anxiety Score 06/10/2021 06/04/2020  Nervous, Anxious, on Edge 0 0  Control/stop worrying 0 0  Worry too much - different things 0 0  Trouble relaxing 0 0  Restless 0 0  Easily annoyed or irritable 0 0  Afraid - awful might happen 0 0  Total GAD 7 Score 0 0      Upstream - 06/10/21 1406       Pregnancy Intention Screening   Does the patient want to become pregnant in the next year? No    Does the patient's partner want to become pregnant in the next year? No    Would the patient like to discuss contraceptive options today? No      Contraception Wrap Up   Current Method No Method - Other Reason   husband had prostate sugery   End Method No Method - Other Reason    Contraception Counseling Provided No            Examination chaperoned by Levy Pupa LPN  Impression and Plan: 1. Visit for gynecologic examination Pap  in 1 year Labs with PCP  Physical with PCP Colonoscopy per GI Mammogram yearly  2. Encounter for screening fecal occult blood testing   3. Pelvic pain in female Return in about 3 weeks for GYN [name] and see me  4. Hot flashes   5. History of ovarian cyst [name] in about 3 weeks to reassess for stability   6. Peri-menopause Discussed peri menopause

## 2021-06-13 NOTE — Telephone Encounter (Signed)
Ok to schedule.  Thanks,  Addy Mcmannis Castaneda Mayorga, MD Gastroenterology and Hepatology [facility]  

## 2021-06-25 ENCOUNTER — Telehealth (INDEPENDENT_AMBULATORY_CARE_PROVIDER_SITE_OTHER): Payer: Self-pay

## 2021-06-25 NOTE — Telephone Encounter (Signed)
Patient called today to report she was recently started on Fe pills. She state she has not taken one since September 30 th. I advised to continue to stay off of them until after her procedure. Patient states understanding.

## 2021-07-01 ENCOUNTER — Other Ambulatory Visit (HOSPITAL_COMMUNITY)
Admission: RE | Admit: 2021-07-01 | Discharge: 2021-07-01 | Disposition: A | Discharge status: Home / Self Care | Payer: 59 | Source: Ambulatory Visit | Attending: Gastroenterology | Admitting: Gastroenterology

## 2021-07-01 ENCOUNTER — Other Ambulatory Visit: Payer: Self-pay

## 2021-07-01 DIAGNOSIS — Z1211 Encounter for screening for malignant neoplasm of colon: Secondary | ICD-10-CM | POA: Diagnosis present

## 2021-07-01 DIAGNOSIS — Z01812 Encounter for preprocedural laboratory examination: Secondary | ICD-10-CM | POA: Diagnosis present

## 2021-07-01 LAB — PREGNANCY, URINE: Preg Test, Ur: NEGATIVE

## 2021-07-03 ENCOUNTER — Encounter (HOSPITAL_COMMUNITY): Payer: Self-pay | Admitting: Gastroenterology

## 2021-07-03 ENCOUNTER — Ambulatory Visit (HOSPITAL_COMMUNITY): Payer: 59 | Admitting: Anesthesiology

## 2021-07-03 ENCOUNTER — Ambulatory Visit (HOSPITAL_COMMUNITY)
Admission: RE | Admit: 2021-07-03 | Discharge: 2021-07-03 | Disposition: A | Discharge status: Home / Self Care | Payer: 59 | Attending: Gastroenterology | Admitting: Gastroenterology

## 2021-07-03 ENCOUNTER — Other Ambulatory Visit: Payer: Self-pay

## 2021-07-03 ENCOUNTER — Encounter (HOSPITAL_COMMUNITY)
Admission: RE | Disposition: A | Discharge status: Home / Self Care | Payer: Self-pay | Source: Home / Self Care | Attending: Gastroenterology

## 2021-07-03 DIAGNOSIS — Z1211 Encounter for screening for malignant neoplasm of colon: Secondary | ICD-10-CM

## 2021-07-03 DIAGNOSIS — K219 Gastro-esophageal reflux disease without esophagitis: Secondary | ICD-10-CM | POA: Insufficient documentation

## 2021-07-03 DIAGNOSIS — B182 Chronic viral hepatitis C: Secondary | ICD-10-CM | POA: Diagnosis not present

## 2021-07-03 DIAGNOSIS — D125 Benign neoplasm of sigmoid colon: Secondary | ICD-10-CM | POA: Diagnosis not present

## 2021-07-03 DIAGNOSIS — Z79899 Other long term (current) drug therapy: Secondary | ICD-10-CM | POA: Diagnosis not present

## 2021-07-03 DIAGNOSIS — K635 Polyp of colon: Secondary | ICD-10-CM | POA: Diagnosis not present

## 2021-07-03 HISTORY — PX: POLYPECTOMY: SHX149

## 2021-07-03 HISTORY — PX: COLONOSCOPY WITH PROPOFOL: SHX5780

## 2021-07-03 LAB — HM COLONOSCOPY

## 2021-07-03 SURGERY — COLONOSCOPY WITH PROPOFOL
Anesthesia: General

## 2021-07-03 MED ORDER — PROPOFOL 500 MG/50ML IV EMUL
INTRAVENOUS | Status: DC | PRN
  Administered 2021-07-03: 150 ug/kg/min via INTRAVENOUS

## 2021-07-03 MED ORDER — PROPOFOL 10 MG/ML IV BOLUS
INTRAVENOUS | Status: DC | PRN
  Administered 2021-07-03: 30 mg via INTRAVENOUS
  Administered 2021-07-03 (×2): 40 mg via INTRAVENOUS

## 2021-07-03 MED ORDER — LACTATED RINGERS IV SOLN
INTRAVENOUS | Status: DC
  Administered 2021-07-03: 1000 mL via INTRAVENOUS

## 2021-07-03 MED ORDER — PROPOFOL 10 MG/ML IV BOLUS
INTRAVENOUS | Status: AC
  Filled 2021-07-03: qty 60

## 2021-07-03 MED ORDER — LIDOCAINE HCL 1 % IJ SOLN
INTRAMUSCULAR | Status: DC | PRN
  Administered 2021-07-03: 40 mg via INTRADERMAL

## 2021-07-03 NOTE — Anesthesia Postprocedure Evaluation (Signed)
Anesthesia Post Note  Patient: [name]  Procedure(s) Performed: COLONOSCOPY WITH PROPOFOL POLYPECTOMY INTESTINAL  Patient location during evaluation: Endoscopy Anesthesia Type: General Level of consciousness: awake and alert Pain management: pain level controlled Vital Signs Assessment: post-procedure vital signs reviewed and stable Respiratory status: spontaneous breathing Cardiovascular status: blood pressure returned to baseline and stable Postop Assessment: no apparent nausea or vomiting Anesthetic complications: no   No notable events documented.   Last Vitals:  Vitals:   07/03/21 0905  BP: (!) 134/97  Pulse: 86  Resp: 15  Temp: 36.7 C  SpO2: 99%    Last Pain:  Vitals:   07/03/21 1001  TempSrc:   PainSc: 0-No pain                 Maurico Perrell

## 2021-07-03 NOTE — Anesthesia Preprocedure Evaluation (Signed)
Anesthesia Evaluation  Patient identified by MRN, date of birth, ID band Patient awake    Reviewed: Allergy & Precautions, H&P , NPO status , Patient's Chart, lab work & pertinent test results, reviewed documented beta blocker date and time   Airway Mallampati: II  TM Distance: >3 FB Neck ROM: full    Dental no notable dental hx.    Pulmonary asthma ,    Pulmonary exam normal breath sounds clear to auscultation       Cardiovascular Exercise Tolerance: Good negative cardio ROS   Rhythm:regular Rate:Normal     Neuro/Psych  Headaches, negative psych ROS   GI/Hepatic GERD  Medicated,(+) Hepatitis -, C  Endo/Other  negative endocrine ROS  Renal/GU negative Renal ROS  negative genitourinary   Musculoskeletal   Abdominal   Peds  Hematology negative hematology ROS (+)   Anesthesia Other Findings   Reproductive/Obstetrics negative OB ROS                             Anesthesia Physical Anesthesia Plan  ASA: 3  Anesthesia Plan: General   Post-op Pain Management:    Induction:   PONV Risk Score and Plan: Propofol infusion  Airway Management Planned:   Additional Equipment:   Intra-op Plan:   Post-operative Plan:   Informed Consent: I have reviewed the patients History and Physical, chart, labs and discussed the procedure including the risks, benefits and alternatives for the proposed anesthesia with the patient or authorized representative who has indicated his/her understanding and acceptance.     Dental Advisory Given  Plan Discussed with: CRNA  Anesthesia Plan Comments:         Anesthesia Quick Evaluation

## 2021-07-03 NOTE — Op Note (Signed)
[facility] Patient Name: Amber Russell Procedure Date: 07/03/2021 9:53 AM MRN: [id number] Date of Birth: 1970-12-22 Attending MD: Maylon Peppers ,  CSN: [id number] Age: 50 Admit Type: Outpatient Procedure:                Colonoscopy Indications:              Screening for colorectal malignant neoplasm Providers:                Maylon Peppers, [place] Page, [place] Risa Grill, Technician Referring MD:              Medicines:                Monitored Anesthesia Care Complications:            No immediate complications. Estimated Blood Loss:     Estimated blood loss: none. Procedure:                Pre-Anesthesia Assessment:                           - Prior to the procedure, a History and Physical                            was performed, and patient medications, allergies                            and sensitivities were reviewed. The patient's                            tolerance of previous anesthesia was reviewed.                           - The risks and benefits of the procedure and the                            sedation options and risks were discussed with the                            patient. All questions were answered and informed                            consent was obtained.                           - ASA Grade Assessment: I - A normal, healthy                            patient.                           After obtaining informed consent, the colonoscope                            was passed under direct vision. Throughout the  procedure, the patient's blood pressure, pulse, and                            oxygen saturations were monitored continuously. The                            [id number]-HQ190L ([id number]) scope was introduced through                            the anus and advanced to the the cecum, identified                            by appendiceal orifice and ileocecal valve. The                             colonoscopy was performed without difficulty. The                            patient tolerated the procedure well. The quality                            of the bowel preparation was excellent. Scope In: 10:06:30 AM Scope Out: 10:32:38 AM Scope Withdrawal Time: 0 hours 16 minutes 39 seconds  Total Procedure Duration: 0 hours 26 minutes 8 seconds  Findings:      The perianal and digital rectal examinations were normal.      A 5 mm polyp was found in the sigmoid colon. The polyp was sessile. The       polyp was removed with a cold snare. Resection and retrieval were       complete.      The retroflexed view of the distal rectum and anal verge was normal and       showed no anal or rectal abnormalities. Impression:               - One 5 mm polyp in the sigmoid colon, removed with                            a cold snare. Resected and retrieved.                           - The distal rectum and anal verge are normal on                            retroflexion view. Moderate Sedation:      Per Anesthesia Care Recommendation:           - Discharge patient to home (ambulatory).                           - Resume previous diet.                           - Await pathology results.                           - Repeat colonoscopy  for surveillance based on                            pathology results. Procedure Code(s):        --- Professional ---                           [phone], Colonoscopy, flexible; with removal of                            tumor(s), polyp(s), or other lesion(s) by snare                            technique Diagnosis Code(s):        --- Professional ---                           Z12.11, Encounter for screening for malignant                            neoplasm of colon                           K63.5, Polyp of colon CPT copyright 2019 American Medical Association. All rights reserved. The codes documented in this report are preliminary and upon coder review may  be  revised to meet current compliance requirements. Maylon Peppers, MD Maylon Peppers,  07/03/2021 10:35:55 AM This report has been signed electronically. Number of Addenda: 0

## 2021-07-03 NOTE — Discharge Instructions (Signed)
You are being discharged to home.  Resume your previous diet.  We are waiting for your pathology results.  Your physician has recommended a repeat colonoscopy for surveillance based on pathology results.  

## 2021-07-03 NOTE — Transfer of Care (Signed)
Immediate Anesthesia Transfer of Care Note  Patient: Amber Russell  Procedure(s) Performed: COLONOSCOPY WITH PROPOFOL POLYPECTOMY INTESTINAL  Patient Location: Endoscopy Unit  Anesthesia Type:General  Level of Consciousness: awake  Airway & Oxygen Therapy: Patient Spontanous Breathing  Post-op Assessment: Report given to RN  Post vital signs: Reviewed  Last Vitals:  Vitals Value Taken Time  BP    Temp    Pulse    Resp    SpO2      Last Pain:  Vitals:   07/03/21 1001  TempSrc:   PainSc: 0-No pain      Patients Stated Pain Goal: 8 (71/16/57 9038)  Complications: No notable events documented.

## 2021-07-03 NOTE — H&P (Addendum)
Amber Russell is an 50 y.o. female.   Chief Complaint: Screening colonoscopy HPI: 50 year old female with past medical history of GERD, chronic hepatitis C, coming for screening colonoscopy. The patient has never had a colonoscopy in the past.  The patient denies having any complaints such as melena, hematochezia, abdominal pain or distention, change in her bowel movement consistency or frequency, no changes in her weight recently.  No family history of colorectal cancer.   Past Medical History:  Diagnosis Date   Contraceptive management 02/15/2015   GERD without esophagitis    Hep C w/o coma, chronic (HCC)    Hx of migraines    Nexplanon removal 02/15/2015    Past Surgical History:  Procedure Laterality Date   BILE DUCT EXPLORATION N/A    CESAREAN SECTION     CESAREAN SECTION     age 76   CHOLECYSTECTOMY     KNEE ARTHROPLASTY      Family History  Problem Relation Age of Onset   Allergic rhinitis Son    Asthma Son    Social History:  reports that she has never smoked. She has never used smokeless tobacco. She reports that she does not drink alcohol and does not use drugs.  Allergies: No Known Allergies  Facility-Administered Medications Prior to Admission  Medication Dose Route Frequency Provider Last Rate Last Admin   predniSONE (DELTASONE) tablet 10 mg  10 mg Oral UD Bobbitt, [place] Muta, MD       Medications Prior to Admission  Medication Sig Dispense Refill   Cholecalciferol (VITAMIN D3) 50000 units CAPS Take 50,000 Units by mouth every Sunday.  0   ibuprofen (ADVIL) 800 MG tablet Take 800 mg by mouth every 8 (eight) hours as needed for moderate pain.     omeprazole (PRILOSEC) 20 MG capsule Take 20 mg by mouth daily as needed (acid reflux).     topiramate (TOPAMAX) 100 MG tablet Take 100 mg by mouth 2 (two) times daily.     FEROSUL 325 (65 Fe) MG tablet Take 325 mg by mouth 2 (two) times daily.     NURTEC 75 MG TBDP Take 75 mg by mouth daily as needed (migraines).       Results for orders placed or performed during the hospital encounter of 07/01/21 (from the past 48 hour(s))  Pregnancy, urine     Status: None   Collection Time: 07/01/21 11:01 AM  Result Value Ref Range   Preg Test, Ur NEGATIVE NEGATIVE    Comment:        THE SENSITIVITY OF THIS METHODOLOGY IS >20 mIU/mL. Performed at [facility], [address] [place], [place] [address]    No results found.  Review of Systems  Constitutional: Negative.   HENT: Negative.    Eyes: Negative.   Respiratory: Negative.    Cardiovascular: Negative.   Gastrointestinal: Negative.   Endocrine: Negative.   Genitourinary: Negative.   Musculoskeletal: Negative.   Skin: Negative.   Allergic/Immunologic: Negative.   Neurological: Negative.   Hematological: Negative.   Psychiatric/Behavioral: Negative.     Blood pressure (!) 134/97, pulse 86, temperature 98 F (36.7 C), temperature source Oral, resp. rate 15, height 5\' 4"  (1.626 m), weight 63.5 kg, last menstrual period 06/10/2021, SpO2 99 %. Physical Exam  GENERAL: The patient is AO x3, in no acute distress. HEENT: Head is normocephalic and atraumatic. EOMI are intact. Mouth is well hydrated and without lesions. NECK: Supple. No masses LUNGS: Clear to auscultation. No presence of rhonchi/wheezing/rales. Adequate chest  expansion HEART: RRR, normal s1 and s2. ABDOMEN: Soft, nontender, no guarding, no peritoneal signs, and nondistended. BS +. No masses. EXTREMITIES: Without any cyanosis, clubbing, rash, lesions or edema. NEUROLOGIC: AOx3, no focal motor deficit. SKIN: no jaundice, no rashes  Assessment/Plan 50 year old female with past medical history of GERD, chronic hepatitis C, coming for screening colonoscopy. The patient is at average risk for colorectal cancer.  We will proceed with colonoscopy today.   Harvel Quale, MD 07/03/2021, 9:59 AM

## 2021-07-04 LAB — SURGICAL PATHOLOGY

## 2021-07-05 ENCOUNTER — Encounter (HOSPITAL_COMMUNITY): Payer: Self-pay | Admitting: Gastroenterology

## 2021-07-11 ENCOUNTER — Ambulatory Visit (INDEPENDENT_AMBULATORY_CARE_PROVIDER_SITE_OTHER): Payer: 59

## 2021-07-11 ENCOUNTER — Other Ambulatory Visit: Payer: Self-pay

## 2021-07-11 ENCOUNTER — Ambulatory Visit (INDEPENDENT_AMBULATORY_CARE_PROVIDER_SITE_OTHER): Payer: 59 | Admitting: Adult Health

## 2021-07-11 ENCOUNTER — Encounter: Payer: Self-pay | Admitting: Adult Health

## 2021-07-11 VITALS — BP 128/76 | HR 56 | Ht 64.0 in | Wt 141.0 lb

## 2021-07-11 DIAGNOSIS — Z8742 Personal history of other diseases of the female genital tract: Secondary | ICD-10-CM

## 2021-07-11 DIAGNOSIS — R102 Pelvic and perineal pain: Secondary | ICD-10-CM

## 2021-07-11 DIAGNOSIS — N951 Menopausal and female climacteric states: Secondary | ICD-10-CM

## 2021-07-11 DIAGNOSIS — D219 Benign neoplasm of connective and other soft tissue, unspecified: Secondary | ICD-10-CM

## 2021-07-11 DIAGNOSIS — N83202 Unspecified ovarian cyst, left side: Secondary | ICD-10-CM

## 2021-07-11 NOTE — Progress Notes (Signed)
PELVIC US TA/TV: heterogeneous anteverted uterus with multiple small fibroids,(#1) mid left intramural fibroid 2 x 1.7 x 1.1 cm,(#2) posterior fundal right intramural fibroid,EEC 13.1 mm,normal right ovary,simple left ovarian cyst 4 x 3.8 x 3.2 cm,ovaries appear mobile,left adnexal pain during ultrasound

## 2021-07-11 NOTE — Progress Notes (Signed)
  Subjective:     Patient ID: Amber Russell, female   DOB: 1970/09/20, 50 y.o.   MRN: [id number]  HPI [name] is a 50 year old female, married, [id number] in for pelvic US to assess left ovary cyst for stability and pelvic pain. PCP is Dr Sherrie Sport. Lab Results  Component Value Date   DIAGPAP  05/30/2019    NEGATIVE FOR INTRAEPITHELIAL LESIONS OR MALIGNANCY.   HPV NOT Detected 05/30/2019    Review of Systems Has pain in pelvic area, more to right size, Reviewed past medical,surgical, social and family history. Reviewed medications and allergies.     Objective:   Physical Exam BP 128/76 (BP Location: Left Arm, Patient Position: Sitting, Cuff Size: Normal)   Pulse (!) 56   Ht 5\' 4"  (1.626 m)   Wt 141 lb (64 kg)   LMP 06/10/2021   BMI 24.20 kg/m     Talk only: [name] today showed 2 small fibroids, mid left, 1.7.x 1.1 x 2 cm, and post right fundal, 1.9 x 1.7 x 1.3 cm, EEC 13.1 cm, right ovary is normal and left ovary has simple left cyst 3.8 x 3.2 x 4 cm which is stable from last year.  Assessment:        1. Cyst of left ovary Cyst is stable   2. Fibroids Fibroids stable in size  3. Peri-menopause  4. Pelvic pain in female May be muscle wall to see PCP    Plan:    She will see final results once Dr Elonda Husky reads it  Pap in September next year

## 2021-07-12 ENCOUNTER — Encounter (INDEPENDENT_AMBULATORY_CARE_PROVIDER_SITE_OTHER): Payer: Self-pay | Admitting: *Deleted

## 2021-08-12 ENCOUNTER — Other Ambulatory Visit: Payer: Self-pay

## 2021-08-12 ENCOUNTER — Ambulatory Visit (INDEPENDENT_AMBULATORY_CARE_PROVIDER_SITE_OTHER): Payer: 59 | Admitting: Orthopedic Surgery

## 2021-08-12 ENCOUNTER — Encounter: Payer: Self-pay | Admitting: Orthopedic Surgery

## 2021-08-12 ENCOUNTER — Ambulatory Visit: Payer: 59

## 2021-08-12 VITALS — BP 132/78 | HR 72 | Ht 64.0 in | Wt 140.0 lb

## 2021-08-12 DIAGNOSIS — M2242 Chondromalacia patellae, left knee: Secondary | ICD-10-CM

## 2021-08-12 DIAGNOSIS — G8929 Other chronic pain: Secondary | ICD-10-CM

## 2021-08-12 DIAGNOSIS — M25562 Pain in left knee: Secondary | ICD-10-CM

## 2021-08-12 MED ORDER — MELOXICAM 7.5 MG PO TABS: 7.5000 mg | ORAL_TABLET | Freq: Every day | ORAL | 5 refills | Status: DC

## 2021-08-12 NOTE — Progress Notes (Signed)
EVALUATION AND MANAGEMENT   Type of appointment : new problem  Chief Complaint  Patient presents with   Leg Pain    Left leg painful from knee to ankle     50 year old female status postarthroscopy left knee in [place] about 15 years ago for cartilage problem presents with anterior knee pain radiating down to the ankle with popping crepitance increased pain with sitting   ROS Positive dizziness and headaches  All other systems were negative   Body mass index is 24.03 kg/m.  Physical Exam Constitutional:      General: She is not in acute distress.    Appearance: She is well-developed.     Comments: Well developed, well nourished Normal grooming and hygiene     Cardiovascular:     Comments: No peripheral edema Musculoskeletal:     Comments: Normal range of motion of the left hip  Left knee no effusion normal alignment Medial and lateral joint lines nontender  Lateral facet tenderness  Positive grind test  Crepitance on range of motion  Meniscal signs are negative  Ligaments are stable  Muscle tone is normal  Skin:    General: Skin is warm and dry.  Neurological:     Mental Status: She is alert and oriented to person, place, and time.     Sensory: No sensory deficit.     Coordination: Coordination normal.     Gait: Gait normal.     Deep Tendon Reflexes: Reflexes are normal and symmetric.  Psychiatric:        Mood and Affect: Mood normal.        Behavior: Behavior normal.        Thought Content: Thought content normal.        Judgment: Judgment normal.     Comments: Affect normal     Past Medical History:  Diagnosis Date   Contraceptive management 02/15/2015   GERD without esophagitis    Hep C w/o coma, chronic (HCC)    Hx of migraines    Nexplanon removal 02/15/2015   Past Surgical History:  Procedure Laterality Date   BILE DUCT EXPLORATION N/A    CESAREAN SECTION     CESAREAN SECTION     age 61   CHOLECYSTECTOMY     COLONOSCOPY WITH PROPOFOL N/A  07/03/2021   Procedure: COLONOSCOPY WITH PROPOFOL;  Surgeon: Harvel Quale, MD;  [facility];  Service: Gastroenterology;  Laterality: N/A;  10:05 AM   KNEE ARTHROPLASTY     POLYPECTOMY  07/03/2021   Procedure: POLYPECTOMY INTESTINAL;  Surgeon: Harvel Quale, MD;  [facility];  Service: Gastroenterology;;   Social History   Tobacco Use   Smoking status: Never   Smokeless tobacco: Never  Vaping Use   Vaping Use: Never used  Substance Use Topics   Alcohol use: Never   Drug use: Never     Assessment and Plan:  Encounter Diagnoses  Name Primary?   Chronic pain of left knee    Chondromalacia patellae, left knee Yes    50 year old female status postarthroscopy left knee in [place] with what sounds like a medial meniscectomy although we are not completely sure presented with anterior knee pain symptoms and patellofemoral pain normal x-rays  She seems to have primarily have chondromalacia recommend physical therapy with home exercise program and meloxicam  Meds ordered this encounter  Medications   meloxicam (MOBIC) 7.5 MG tablet    Sig: Take 1 tablet (7.5 mg total) by mouth daily.  Dispense:  30 tablet    Refill:  5

## 2021-08-12 NOTE — Patient Instructions (Signed)
Physical therapy has been ordered for you at [facility]. They should call you to schedule, [phone] is the phone number to call, if you want to call to schedule.   

## 2021-08-26 ENCOUNTER — Ambulatory Visit (HOSPITAL_COMMUNITY): Payer: 59

## 2021-08-29 ENCOUNTER — Encounter (HOSPITAL_COMMUNITY): Payer: Self-pay | Admitting: Physical Therapy

## 2021-08-29 ENCOUNTER — Ambulatory Visit (HOSPITAL_COMMUNITY): Payer: 59 | Attending: Orthopedic Surgery | Admitting: Physical Therapy

## 2021-08-29 ENCOUNTER — Other Ambulatory Visit: Payer: Self-pay

## 2021-08-29 DIAGNOSIS — M2242 Chondromalacia patellae, left knee: Secondary | ICD-10-CM | POA: Diagnosis not present

## 2021-08-29 DIAGNOSIS — M6281 Muscle weakness (generalized): Secondary | ICD-10-CM | POA: Diagnosis present

## 2021-08-29 DIAGNOSIS — G8929 Other chronic pain: Secondary | ICD-10-CM | POA: Insufficient documentation

## 2021-08-29 DIAGNOSIS — R262 Difficulty in walking, not elsewhere classified: Secondary | ICD-10-CM | POA: Insufficient documentation

## 2021-08-29 DIAGNOSIS — M25562 Pain in left knee: Secondary | ICD-10-CM | POA: Diagnosis not present

## 2021-08-29 NOTE — Therapy (Signed)
[place], [place], [address] Phone: [phone]   Fax:  [phone]  Physical Therapy Evaluation  Patient Details  Name: Amber Russell MRN: [id number] Date of Birth: 1971/01/31 Referring Provider (PT): Arther Abbott   Encounter Date: 08/29/2021   PT End of Session - 08/29/21 1618     Visit Number 1    Number of Visits 4    Date for PT Re-Evaluation 09/26/21    Authorization Type [employer] no auth , 30 VL combined 0 used    Authorization - Visit Number 1    Authorization - Number of Visits 30    Progress Note Due on Visit 10    PT Start Time 5277    PT Stop Time 1650    PT Time Calculation (min) 35 min    Activity Tolerance Patient tolerated treatment well    Behavior During Therapy [facility] for tasks assessed/performed             Past Medical History:  Diagnosis Date   Contraceptive management 02/15/2015   GERD without esophagitis    Hep C w/o coma, chronic (HCC)    Hx of migraines    Nexplanon removal 02/15/2015    Past Surgical History:  Procedure Laterality Date   BILE DUCT EXPLORATION N/A    CESAREAN SECTION     CESAREAN SECTION     age 80   CHOLECYSTECTOMY     COLONOSCOPY WITH PROPOFOL N/A 07/03/2021   Procedure: COLONOSCOPY WITH PROPOFOL;  Surgeon: Harvel Quale, MD;  [facility];  Service: Gastroenterology;  Laterality: N/A;  10:05 AM   KNEE ARTHROPLASTY     POLYPECTOMY  07/03/2021   Procedure: POLYPECTOMY INTESTINAL;  Surgeon: Harvel Quale, MD;  [facility];  Service: Gastroenterology;;    There were no vitals filed for this visit.    Subjective Assessment - 08/29/21 1623     Subjective States that she has been having left knee pain for the past couple of months. States that she had a history of left knee surgery over 15 years ago. Current pain level in left knee is 8/10 states pain is worse if she does too much standing or  sitting for long periods of time. States that if her knee hurts she takes medicine ibuprofen or naproxen. States that after sitting for long periods of time if she gets up and moves around her knee pain improves. Reports she is currently not working but she does take her kids to college in [place] and goes grocery shopping.    Currently in Pain? Yes    Pain Score 8     Pain Location Knee    Pain Orientation Left    Pain Descriptors / Indicators Aching    Aggravating Factors  sitting and standing in one place                [facility] PT Assessment - 08/29/21 0001       Assessment   Medical Diagnosis left knee pain    Referring Provider (PT) Arther Abbott      Observation/Other Assessments   Focus on Therapeutic Outcomes (FOTO)  one time visit not applicable      ROM / Strength   AROM / PROM / Strength AROM;Strength      AROM   AROM Assessment Site Knee    Right/Left Knee Right;Left    Right Knee Extension 0    Right Knee Flexion 135  Left Knee Extension 0    Left Knee Flexion 135      Strength   Strength Assessment Site Knee;Ankle;Hip    Right/Left Hip Left;Right    Right Hip Flexion 4+/5    Right Hip ABduction 4+/5    Left Hip Flexion 4+/5    Left Hip ABduction 4-/5    Right/Left Knee Right;Left    Right Knee Flexion 4+/5    Right Knee Extension 5/5    Left Knee Flexion 4-/5    Left Knee Extension 4/5    Right/Left Ankle Left;Right    Right Ankle Dorsiflexion 5/5    Right Ankle Plantar Flexion 5/5    Left Ankle Dorsiflexion 5/5    Left Ankle Plantar Flexion 3+/5    Left Ankle Inversion --   10 heel raises                       Objective measurements completed on examination: See above findings.       [facility] PT Treatment/Exercise - 08/29/21 0001       Exercises   Exercises Knee/Hip      Knee/Hip Exercises: Standing   Heel Raises 20 reps;5 seconds   double leg   Wall Squat 5 reps;10 seconds      Knee/Hip Exercises: Supine    Bridges 5 reps   5" holds   Straight Leg Raises 2 sets;5 reps   5" holds B     Knee/Hip Exercises: Sidelying   Clams x5 5" holds B                     PT Education - 08/29/21 1648     Education Details on current presentation, POC and HEP, walking program    Person(s) Educated Patient    Methods Explanation    Comprehension Verbalized understanding              PT Short Term Goals - 08/29/21 1656       PT SHORT TERM GOAL #1   Title Patient will be independent in self management strategies to improve quality of life and functional outcomes.    Time 2    Period Weeks    Status New    Target Date 09/12/21               PT Long Term Goals - 08/29/21 1656       PT LONG TERM GOAL #1   Title Patient will report at least 50% improvement in overall symptoms and/or function to demonstrate improved functional mobility    Time 4    Period Weeks    Status New    Target Date 09/26/21      PT LONG TERM GOAL #2   Title Patient will be able to hold a wall squat for at least 60 seconds    Time 4    Period Weeks    Status New    Target Date 09/26/21                    Plan - 08/29/21 1654     Clinical Impression Statement Patient is a 50 y.o. female who presents to physical therapy with complaint of left knee pain . Patient demonstrates decreased strength which are likely contributing to symptoms of pain and are negatively impacting patient ability to perform ADLs and functional mobility tasks. Patient will benefit from skilled physical therapy services to address these deficits to reduce pain,  improve level of function with ADLs, functional mobility tasks, and reduce risk for falls.    Personal Factors and Comorbidities Comorbidity 1    Comorbidities hx of left knee surgery    Examination-Activity Limitations Stand;Sit    Examination-Participation Restrictions Driving    Stability/Clinical Decision Making Stable/Uncomplicated    Clinical Decision  Making Low    Rehab Potential Excellent    PT Frequency 1x / week    PT Duration 4 weeks    PT Treatment/Interventions ADLs/Self Care Home Management;Neuromuscular re-education;Manual techniques;Therapeutic activities;Therapeutic exercise;Patient/family education;Dry needling    PT Next Visit Plan probably one time visit but patient wanted option to possibly come in a couple more times for more exercsies; LE strengthening, HEP development    PT Home Exercise Plan SLR, heel raises, wall squats, clamshells, bridge    Consulted and Agree with Plan of Care Patient             Patient will benefit from skilled therapeutic intervention in order to improve the following deficits and impairments:  Pain, Decreased strength  Visit Diagnosis: Chronic pain of left knee  Difficulty in walking, not elsewhere classified  Muscle weakness (generalized)     Problem List Patient Active Problem List   Diagnosis Date Noted   Hot flashes 06/10/2021   Pelvic pain in female 06/10/2021   Encounter for screening fecal occult blood testing 06/10/2021   Visit for gynecologic examination 06/10/2021   History of ovarian cyst 06/10/2021   Peri-menopause 06/10/2021   Cyst of left ovary 07/11/2020   Fibroids 07/11/2020   Encounter for IUD removal 07/11/2020   Encounter for IUD insertion 06/04/2020   Migraines, neuralgic 12/14/2018   Vitamin D deficiency, unspecified 12/14/2018   Fatigue 12/14/2018   Perennial and seasonal allergic rhinitis 01/29/2016   Seasonal allergic conjunctivitis 01/29/2016   Moderate persistent asthma with mild exacerbation 01/29/2016   TMJ arthralgia 12/27/2012   Varicose veins of lower extremity with inflammation 06/16/2012   Chronic hepatitis C ([place]) 06/06/2011   4:59 PM, 08/29/21 Jerene Pitch, DPT Physical Therapy with [facility]  [phone] office   [place] [address] [place], [place],  [address] Phone: [phone]   Fax:  [phone]  Name: Amber Russell MRN: [id number] Date of Birth: [birth year]

## 2021-09-01 ENCOUNTER — Encounter (HOSPITAL_COMMUNITY): Payer: Self-pay | Admitting: Emergency Medicine

## 2021-09-01 ENCOUNTER — Emergency Department (HOSPITAL_COMMUNITY): Payer: 59

## 2021-09-01 ENCOUNTER — Emergency Department (HOSPITAL_COMMUNITY)
Admission: EM | Admit: 2021-09-01 | Discharge: 2021-09-01 | Disposition: A | Discharge status: Home / Self Care | Payer: 59 | Attending: Emergency Medicine | Admitting: Emergency Medicine

## 2021-09-01 ENCOUNTER — Other Ambulatory Visit: Payer: Self-pay

## 2021-09-01 DIAGNOSIS — R630 Anorexia: Secondary | ICD-10-CM | POA: Diagnosis not present

## 2021-09-01 DIAGNOSIS — R110 Nausea: Secondary | ICD-10-CM | POA: Insufficient documentation

## 2021-09-01 DIAGNOSIS — R1031 Right lower quadrant pain: Secondary | ICD-10-CM

## 2021-09-01 DIAGNOSIS — Z96659 Presence of unspecified artificial knee joint: Secondary | ICD-10-CM | POA: Insufficient documentation

## 2021-09-01 LAB — CBC
HCT: 38.3 % (ref 36.0–46.0)
Hemoglobin: 12.3 g/dL (ref 12.0–15.0)
MCH: 28 pg (ref 26.0–34.0)
MCHC: 32.1 g/dL (ref 30.0–36.0)
MCV: 87.2 fL (ref 80.0–100.0)
Platelets: 170 10*3/uL (ref 150–400)
RBC: 4.39 MIL/uL (ref 3.87–5.11)
RDW: 15.2 % (ref 11.5–15.5)
WBC: 7.3 10*3/uL (ref 4.0–10.5)
nRBC: 0 % (ref 0.0–0.2)

## 2021-09-01 LAB — COMPREHENSIVE METABOLIC PANEL
ALT: 13 U/L (ref 0–44)
AST: 16 U/L (ref 15–41)
Albumin: 4.4 g/dL (ref 3.5–5.0)
Alkaline Phosphatase: 35 U/L — ABNORMAL LOW (ref 38–126)
Anion gap: 8 (ref 5–15)
BUN: 13 mg/dL (ref 6–20)
CO2: 22 mmol/L (ref 22–32)
Calcium: 9.2 mg/dL (ref 8.9–10.3)
Chloride: 107 mmol/L (ref 98–111)
Creatinine, Ser: 0.46 mg/dL (ref 0.44–1.00)
GFR, Estimated: >60 mL/min (ref >60)
Glucose, Bld: 92 mg/dL (ref 70–99)
Potassium: 3.4 mmol/L — ABNORMAL LOW (ref 3.5–5.1)
Sodium: 137 mmol/L (ref 135–145)
Total Bilirubin: 0.9 mg/dL (ref 0.3–1.2)
Total Protein: 7.5 g/dL (ref 6.5–8.1)

## 2021-09-01 LAB — URINALYSIS, ROUTINE W REFLEX MICROSCOPIC
Bilirubin Urine: NEGATIVE
Glucose, UA: NEGATIVE mg/dL
Hgb urine dipstick: NEGATIVE
Ketones, ur: 5 mg/dL — AB
Leukocytes,Ua: NEGATIVE
Nitrite: NEGATIVE
Protein, ur: NEGATIVE mg/dL
Specific Gravity, Urine: 1.013 (ref 1.005–1.030)
pH: 6 (ref 5.0–8.0)

## 2021-09-01 LAB — POC URINE PREG, ED: Preg Test, Ur: NEGATIVE

## 2021-09-01 LAB — LIPASE, BLOOD: Lipase: 130 U/L — ABNORMAL HIGH (ref 11–51)

## 2021-09-01 MED ORDER — IOHEXOL 300 MG/ML  SOLN
100.0000 mL | Freq: Once | INTRAMUSCULAR | Status: AC | PRN
  Administered 2021-09-01: 03:00:00 100 mL via INTRAVENOUS

## 2021-09-01 MED ORDER — FENTANYL CITRATE PF 50 MCG/ML IJ SOSY
25.0000 ug | PREFILLED_SYRINGE | Freq: Once | INTRAMUSCULAR | Status: AC
  Administered 2021-09-01: 03:00:00 25 ug via INTRAVENOUS
  Filled 2021-09-01: qty 1

## 2021-09-01 MED ORDER — SODIUM CHLORIDE 0.9 % IV BOLUS
1000.0000 mL | Freq: Once | INTRAVENOUS | Status: AC
  Administered 2021-09-01: 03:00:00 1000 mL via INTRAVENOUS

## 2021-09-01 NOTE — ED Notes (Signed)
Patient transported to CT 

## 2021-09-01 NOTE — ED Provider Notes (Signed)
[facility] Emergency Department Provider Note MRN:  [id number]  Arrival date & time: 09/01/21     Chief Complaint   Abdominal Pain   History of Present Illness   Amber Russell is a 50 y.o. year-old female with a history of Hepatitis C presenting to the ED with chief complaint of abdominal pain.  Location: Right flank and right lower quadrant Duration: 2 days Onset: Gradual Timing: Constant Description: Sharp Severity: Mild to moderate Exacerbating/Alleviating Factors: Worse with walking, worse when lying down Associated Symptoms: Mild nausea and decreased appetite Pertinent Negatives: No fever, no chest pain or shortness of breath, no diarrhea, no vaginal bleeding or discharge, no dysuria or hematuria  Additional History: History of cholecystectomy   Review of Systems  A complete 10 system review of systems was obtained and all systems are negative except as noted in the HPI and PMH.   Patient's Health History    Past Medical History:  Diagnosis Date   Contraceptive management 02/15/2015   GERD without esophagitis    Hep C w/o coma, chronic (HCC)    Hx of migraines    Nexplanon removal 02/15/2015    Past Surgical History:  Procedure Laterality Date   BILE DUCT EXPLORATION N/A    CESAREAN SECTION     CESAREAN SECTION     age 29   CHOLECYSTECTOMY     COLONOSCOPY WITH PROPOFOL N/A 07/03/2021   Procedure: COLONOSCOPY WITH PROPOFOL;  Surgeon: Harvel Quale, MD;  [facility];  Service: Gastroenterology;  Laterality: N/A;  10:05 AM   KNEE ARTHROPLASTY     POLYPECTOMY  07/03/2021   Procedure: POLYPECTOMY INTESTINAL;  Surgeon: Montez Morita, Quillian Quince, MD;  [facility];  Service: Gastroenterology;;    Family History  Problem Relation Age of Onset   Allergic rhinitis Son    Asthma Son     Social History   Socioeconomic History   Marital status: Married    Spouse name: Not on file   Number of children:  Not on file   Years of education: Not on file   Highest education level: Not on file  Occupational History   Not on file  Tobacco Use   Smoking status: Never   Smokeless tobacco: Never  Vaping Use   Vaping Use: Never used  Substance and Sexual Activity   Alcohol use: Never   Drug use: Never   Sexual activity: Yes    Birth control/protection: None, Other-see comments    Comment: husband had prostate sugery   Other Topics Concern   Not on file  Social History Narrative   ** Merged History Encounter **       Social Determinants of Health   Financial Resource Strain: Not on file  Food Insecurity: Not on file  Transportation Needs: Not on file  Physical Activity: Not on file  Stress: Not on file  Social Connections: Not on file  Intimate Partner Violence: Not on file     Physical Exam   Vitals:   09/01/21 0150  BP: 135/71  Pulse: 62  Resp: 18  Temp: 97.7 F (36.5 C)  SpO2: 100%    CONSTITUTIONAL: Well-appearing, NAD NEURO:  Alert and oriented x 3, no focal deficits EYES:  eyes equal and reactive ENT/NECK:  no LAD, no JVD CARDIO: Regular rate, well-perfused, normal S1 and S2 PULM:  CTAB no wheezing or rhonchi GI/GU:  normal bowel sounds, non-distended, mild right lower quadrant tenderness to palpation MSK/SPINE:  No gross deformities,  no edema SKIN:  no rash, atraumatic PSYCH:  Appropriate speech and behavior  *Additional and/or pertinent findings included in MDM below  Diagnostic and Interventional Summary    EKG Interpretation  Date/Time:    Ventricular Rate:    PR Interval:    QRS Duration:   QT Interval:    QTC Calculation:   R Axis:     Text Interpretation:         Labs Reviewed  LIPASE, BLOOD - Abnormal; Notable for the following components:      Result Value   Lipase 130 (*)    All other components within normal limits  COMPREHENSIVE METABOLIC PANEL - Abnormal; Notable for the following components:   Potassium 3.4 (*)    Alkaline  Phosphatase 35 (*)    All other components within normal limits  URINALYSIS, ROUTINE W REFLEX MICROSCOPIC - Abnormal; Notable for the following components:   Color, Urine STRAW (*)    Ketones, ur 5 (*)    All other components within normal limits  CBC  POC URINE PREG, ED    CT ABDOMEN PELVIS W CONTRAST  Final Result      Medications  sodium chloride 0.9 % bolus 1,000 mL (0 mLs Intravenous Stopped 09/01/21 0401)  fentaNYL (SUBLIMAZE) injection 25 mcg (25 mcg Intravenous Given 09/01/21 0241)  iohexol (OMNIPAQUE) 300 MG/ML solution 100 mL (100 mLs Intravenous Contrast Given 09/01/21 0307)     Procedures  /  Critical Care Procedures  ED Course and Medical Decision Making  I have reviewed the triage vital signs, the nursing notes, and pertinent available records from the EMR.  Listed above are laboratory and imaging tests that I personally ordered, reviewed, and interpreted and then considered in my medical decision making (see below for details).  DDx includes appendicitis, kidney stone, right-sided diverticulitis, ovarian cyst awaiting CT.     CT overall reassuring, normal kidneys, no stones, normal index.  Does have bilateral ovarian cysts, this is known to the patient and she follows with GYN.  Suspect MSK versus symptomatic cyst, highly doubt torsion, appropriate for discharge.  Barth Kirks. Sedonia Small, [place] [email or web address]  Final Clinical Impressions(s) / ED Diagnoses     ICD-10-CM   1. Right lower quadrant abdominal pain  R10.31       ED Discharge Orders     None        Discharge Instructions Discussed with and Provided to Patient:     Discharge Instructions      You were evaluated in the Emergency Department and after careful evaluation, we did not find any emergent condition requiring admission or further testing in the hospital.  Your exam/testing today is overall reassuring.  CT scan did not show any  emergencies.  Symptoms may be related to muscle spasm or ovarian cyst.  Recommend follow-up with your regular doctors, OB/GYN.  Recommend Tylenol or Motrin for pain.  Please return to the Emergency Department if you experience any worsening of your condition.   Thank you for allowing [name] to be a part of your care.        Maudie Flakes, MD 09/01/21 [phone]

## 2021-09-01 NOTE — ED Triage Notes (Signed)
Pt with c/o RLQ abdominal pain that started FRiday. Pt states movement  (walking) makes it worse as well as when she lies down.

## 2021-09-01 NOTE — Discharge Instructions (Signed)
You were evaluated in the Emergency Department and after careful evaluation, we did not find any emergent condition requiring admission or further testing in the hospital.  Your exam/testing today is overall reassuring.  CT scan did not show any emergencies.  Symptoms may be related to muscle spasm or ovarian cyst.  Recommend follow-up with your regular doctors, OB/GYN.  Recommend Tylenol or Motrin for pain.  Please return to the Emergency Department if you experience any worsening of your condition.   Thank you for allowing [name] to be a part of your care.

## 2021-09-05 ENCOUNTER — Encounter: Payer: Self-pay | Admitting: Adult Health

## 2021-09-05 ENCOUNTER — Ambulatory Visit (INDEPENDENT_AMBULATORY_CARE_PROVIDER_SITE_OTHER): Payer: 59 | Admitting: Adult Health

## 2021-09-05 ENCOUNTER — Other Ambulatory Visit: Payer: Self-pay

## 2021-09-05 VITALS — BP 92/60 | HR 66 | Ht 64.0 in | Wt 146.0 lb

## 2021-09-05 DIAGNOSIS — R1011 Right upper quadrant pain: Secondary | ICD-10-CM | POA: Diagnosis not present

## 2021-09-05 DIAGNOSIS — R748 Abnormal levels of other serum enzymes: Secondary | ICD-10-CM

## 2021-09-05 DIAGNOSIS — R102 Pelvic and perineal pain: Secondary | ICD-10-CM | POA: Diagnosis not present

## 2021-09-05 DIAGNOSIS — N83202 Unspecified ovarian cyst, left side: Secondary | ICD-10-CM

## 2021-09-05 DIAGNOSIS — N83201 Unspecified ovarian cyst, right side: Secondary | ICD-10-CM | POA: Diagnosis not present

## 2021-09-05 NOTE — Progress Notes (Signed)
°  Subjective:     Patient ID: Amber Russell, female   DOB: 13-Jan-1971, 50 y.o.   MRN: [id number]  HPI Amber Russell is a 50 year old female, married, [id number], in for follow up of ER visit at [facility] 09/01/21 for pelvic pain, lipase elevated at 130, and CT showed bilateral ovarian cysts.Better now. She had 1 episode of nausea, she is sp Gallbaldder removal. She is leaving for [place] on 09/08/21 to 09/17/21.  PCP is Dr Sherrie Sport.  Lab Results  Component Value Date   DIAGPAP  05/30/2019    NEGATIVE FOR INTRAEPITHELIAL LESIONS OR MALIGNANCY.   HPV NOT Detected 05/30/2019    Review of Systems +pelvic pain,better now Reviewed past medical,surgical, social and family history. Reviewed medications and allergies.     Objective:   Physical Exam BP 92/60 (BP Location: Left Arm, Patient Position: Sitting, Cuff Size: Normal)    Pulse 66    Ht 5\' 4"  (1.626 m)    Wt 146 lb (66.2 kg)    LMP 08/08/2021    BMI 25.06 kg/m     Skin warm and dry. Abdomen is soft and has RUQ tenderness, no HSM noted.Pelvic: external genitalia is normal in appearance no lesions, vagina: pink,urethra has no lesions or masses noted, cervix:smooth and bulbous, uterus: normal size, shape and contour, non tender, no masses felt, adnexa: no masses,some tenderness noted. Bladder is non tender and no masses felt.  Fall risk is low  Upstream - 09/05/21 1102       Pregnancy Intention Screening   Does the patient want to become pregnant in the next year? No    Does the patient's partner want to become pregnant in the next year? No    Would the patient like to discuss contraceptive options today? No      Contraception Wrap Up   Current Method No Method - Other Reason   husband had prostate surgery   End Method No Method - Other Reason   husband had prostate surgery   Contraception Counseling Provided No            Examination chaperoned by Levy Pupa LPN  Assessment:     1. Cysts of both ovaries Pelvic US scheduled for 09/19/21  at [facility] - US PELVIC COMPLETE WITH TRANSVAGINAL; Future  2. Pelvic pain in female  3. RUQ pain [name] scheduled 09/19/21 at [facility] at 8:30 am - US Abdomen Complete; Future  4. Elevated lipase Check lipase today - US Abdomen Complete; Future - Lipase     Plan:     Follow up with me 09/20/21

## 2021-09-06 LAB — LIPASE: Lipase: 75 U/L — ABNORMAL HIGH (ref 14–72)

## 2021-09-19 ENCOUNTER — Other Ambulatory Visit: Payer: Self-pay

## 2021-09-19 ENCOUNTER — Ambulatory Visit (HOSPITAL_COMMUNITY)
Admission: RE | Admit: 2021-09-19 | Discharge: 2021-09-19 | Disposition: A | Discharge status: Home / Self Care | Payer: 59 | Source: Ambulatory Visit | Attending: Adult Health | Admitting: Adult Health

## 2021-09-19 DIAGNOSIS — R748 Abnormal levels of other serum enzymes: Secondary | ICD-10-CM | POA: Diagnosis present

## 2021-09-19 DIAGNOSIS — N83201 Unspecified ovarian cyst, right side: Secondary | ICD-10-CM | POA: Diagnosis present

## 2021-09-19 DIAGNOSIS — N83202 Unspecified ovarian cyst, left side: Secondary | ICD-10-CM | POA: Diagnosis present

## 2021-09-19 DIAGNOSIS — R1011 Right upper quadrant pain: Secondary | ICD-10-CM | POA: Insufficient documentation

## 2021-09-20 ENCOUNTER — Encounter: Payer: Self-pay | Admitting: Adult Health

## 2021-09-20 ENCOUNTER — Ambulatory Visit (INDEPENDENT_AMBULATORY_CARE_PROVIDER_SITE_OTHER): Payer: 59 | Admitting: Adult Health

## 2021-09-20 VITALS — BP 108/71 | HR 56 | Ht 64.0 in | Wt 146.6 lb

## 2021-09-20 DIAGNOSIS — N83202 Unspecified ovarian cyst, left side: Secondary | ICD-10-CM

## 2021-09-20 DIAGNOSIS — N951 Menopausal and female climacteric states: Secondary | ICD-10-CM

## 2021-09-20 DIAGNOSIS — N83201 Unspecified ovarian cyst, right side: Secondary | ICD-10-CM

## 2021-09-20 DIAGNOSIS — N926 Irregular menstruation, unspecified: Secondary | ICD-10-CM

## 2021-09-20 DIAGNOSIS — K760 Fatty (change of) liver, not elsewhere classified: Secondary | ICD-10-CM

## 2021-09-20 LAB — POCT URINE PREGNANCY: Preg Test, Ur: NEGATIVE

## 2021-09-20 MED ORDER — SLYND 4 MG PO TABS: 1.0000 | ORAL_TABLET | Freq: Every day | ORAL | 0 refills | Status: DC

## 2021-09-20 NOTE — Progress Notes (Signed)
°  Subjective:     Patient ID: Amber Russell, female   DOB: 1971-08-21, 51 y.o.   MRN: [id number]  HPI Amber Russell is a 51 year old female, married, [id number], back in follow on having bilateral ovarian cysts on CT, had follow up US yesterday at [facility]. She is feeling better. Lab Results  Component Value Date   DIAGPAP  05/30/2019    NEGATIVE FOR INTRAEPITHELIAL LESIONS OR MALIGNANCY.   HPV NOT Detected 05/30/2019    PCP is Dr Amber Russell  Review of Systems Missed period in December + hot flashes    Reviewed past medical,surgical, social and family history. Reviewed medications and allergies.      Objective:   Physical Exam BP 108/71 (BP Location: Right Arm, Patient Position: Sitting, Cuff Size: Normal)    Pulse (!) 56    Ht 5\' 4"  (1.626 m)    Wt 146 lb 9.6 oz (66.5 kg)    LMP 08/07/2021 (Exact Date)    BMI 25.16 kg/m  UPT is negative.   Skin warm and dry. Lungs: clear to ausculation bilaterally. Cardiovascular: regular rate and rhythm. Reviewed [name] with her and daughter.  MPRESSION: Status post cholecystectomy. Possible fatty infiltration in the liver. Abdominal sonogram is otherwise unremarkable.   IMPRESSION: There are simple appearing cysts in both adnexal regions suggesting functional ovarian cysts. There is slightly inhomogeneous echogenicity in the myometrium with possible small 1.2 cm fibroid. There is prominence of endometrial stripe in the fundus, possibly suggesting secretory phase of menstrual cycle. Follow-up pelvic sonogram in the 1-2 months may be considered to re-evaluate this finding. Discussed with Dr Amber Russell will try slynd for 2-3 months and repeat US in 10 weeks   Upstream - 09/20/21 0954       Pregnancy Intention Screening   Does the patient want to become pregnant in the next year? No    Does the patient's partner want to become pregnant in the next year? No    Would the patient like to discuss contraceptive options today? No      Contraception Wrap Up    Current Method No Method - Other Reason   Husband had prostate surgery   End Method No Method - Other Reason    Contraception Counseling Provided No             Assessment:    1. Cysts of both ovaries Return in 10 weeks for Follow up US and see me  Gave 3 packs Slynd to start today  - US PELVIC COMPLETE WITH TRANSVAGINAL; Future  2. Peri-menopause  3. Missed period - POCT urine pregnancy  4. Fatty liver     Plan:     Follow up US in about 10 weeks and see me

## 2021-11-29 ENCOUNTER — Ambulatory Visit: Payer: 59 | Admitting: Adult Health

## 2021-11-29 ENCOUNTER — Other Ambulatory Visit: Payer: 59

## 2021-12-27 ENCOUNTER — Other Ambulatory Visit: Payer: 59

## 2021-12-27 ENCOUNTER — Ambulatory Visit: Payer: 59 | Admitting: Adult Health

## 2022-01-13 ENCOUNTER — Ambulatory Visit (INDEPENDENT_AMBULATORY_CARE_PROVIDER_SITE_OTHER): Payer: 59 | Admitting: Adult Health

## 2022-01-13 ENCOUNTER — Ambulatory Visit (INDEPENDENT_AMBULATORY_CARE_PROVIDER_SITE_OTHER): Payer: 59

## 2022-01-13 ENCOUNTER — Encounter: Payer: Self-pay | Admitting: Adult Health

## 2022-01-13 VITALS — BP 130/78 | HR 61 | Ht 64.0 in | Wt 149.0 lb

## 2022-01-13 DIAGNOSIS — D219 Benign neoplasm of connective and other soft tissue, unspecified: Secondary | ICD-10-CM | POA: Diagnosis not present

## 2022-01-13 DIAGNOSIS — N83201 Unspecified ovarian cyst, right side: Secondary | ICD-10-CM | POA: Diagnosis not present

## 2022-01-13 DIAGNOSIS — R1031 Right lower quadrant pain: Secondary | ICD-10-CM | POA: Diagnosis not present

## 2022-01-13 DIAGNOSIS — N83202 Unspecified ovarian cyst, left side: Secondary | ICD-10-CM | POA: Diagnosis not present

## 2022-01-13 MED ORDER — NORETHINDRONE 0.35 MG PO TABS: 1.0000 | ORAL_TABLET | Freq: Every day | ORAL | 11 refills | Status: DC

## 2022-01-13 NOTE — Progress Notes (Signed)
?  Subjective:  ?  ? Patient ID: Amber Russell, female   DOB: Jan 01, 1971, 51 y.o.   MRN: [id number] ? ?HPI ?Amber Russell is a 51 year old female, married, [id number], in for review of GYN [name]. She had bilateral ovary cysts and RLQ pain, was on slynd samples for 3 months and felt better.The RLQ pain is few days to week before period. ?Lab Results  ?Component Value Date  ? DIAGPAP  05/30/2019  ?  NEGATIVE FOR INTRAEPITHELIAL LESIONS OR MALIGNANCY.  ? HPV NOT Detected 05/30/2019  ?  ?PCP is Dr Sherrie Sport ? ?Review of Systems ?Still has pain RLQ at times ?Having periods ?Reviewed past medical,surgical, social and family history. Reviewed medications and allergies.  ?   ?Objective:  ? Physical Exam ?BP 130/78 (BP Location: Left Arm, Patient Position: Sitting, Cuff Size: Normal)   Pulse 61   Ht '5\' 4"'$  (1.626 m)   Wt 149 lb (67.6 kg)   BMI 25.58 kg/m?   ?  Skin warm and dry.Lungs: clear to ausculation bilaterally. Cardiovascular: regular rate and rhythm.  ?Reviewed [name] with her: heterogeneous uterus with EEC 8.7 mm. No cyst right ovary and simple cyst left ovary 5.1 x 5 x 4.2 cm and has 2 small fibroids, little to no change.  ?Fall risk is low ? Upstream - 01/13/22 1514   ? ?  ? Pregnancy Intention Screening  ? Does the patient want to become pregnant in the next year? No   ? Does the patient's partner want to become pregnant in the next year? No   ? Would the patient like to discuss contraceptive options today? No   ?  ? Contraception Wrap Up  ? Current Method Vasectomy   ? End Method Vasectomy   ? ?  ?  ? ?  ?  ?Assessment:  ?   ?1. Cyst of left ovary ?Will continue POP,can start now on first day of period  ?Meds ordered this encounter  ?Medications  ? norethindrone (MICRONOR) 0.35 MG tablet  ?  Sig: Take 1 tablet (0.35 mg total) by mouth daily.  ?  Dispense:  28 tablet  ?  Refill:  11  ?  Order Specific Question:   Supervising Provider  ?  Answer:   Tania Ade H [[id number]]  ?  ? ?2. RLQ abdominal pain ?Will continue POP ? ?3.  Fibroids ?These are small. ?   ?Plan:  ?   ?Follow up in about 4 months for ROS  ?   ?

## 2022-01-13 NOTE — Progress Notes (Signed)
PELVIC US TA/TV: heterogeneous anteverted uterus with multiple small fibroids N/C,(#1) mid left intramural fibroid 2 x 1.2 x 1.5 cm,(#2) fundal intramural fibroid 1.7 x .9 x 1.8 cm,EEC 8.7 mm,normal right ovary,simple left ovarian cyst 4.5 x 4.4 x 3.6 cm,ovaries appear mobile,no free fluid,left adnexal pain during ultrasound  ? ?Chaperone Angel  ?

## 2022-01-14 ENCOUNTER — Other Ambulatory Visit: Payer: 59

## 2022-03-02 ENCOUNTER — Other Ambulatory Visit: Payer: Self-pay | Admitting: Orthopedic Surgery

## 2022-03-02 DIAGNOSIS — M2242 Chondromalacia patellae, left knee: Secondary | ICD-10-CM

## 2022-05-12 DIAGNOSIS — Z6824 Body mass index (BMI) 24.0-24.9, adult: Secondary | ICD-10-CM | POA: Diagnosis not present

## 2022-05-12 DIAGNOSIS — G43909 Migraine, unspecified, not intractable, without status migrainosus: Secondary | ICD-10-CM | POA: Diagnosis not present

## 2022-05-12 DIAGNOSIS — L639 Alopecia areata, unspecified: Secondary | ICD-10-CM | POA: Diagnosis not present

## 2022-05-12 DIAGNOSIS — R040 Epistaxis: Secondary | ICD-10-CM | POA: Diagnosis not present

## 2022-05-12 DIAGNOSIS — M1710 Unilateral primary osteoarthritis, unspecified knee: Secondary | ICD-10-CM | POA: Diagnosis not present

## 2022-05-20 ENCOUNTER — Ambulatory Visit: Payer: Self-pay | Admitting: Adult Health

## 2022-07-01 DIAGNOSIS — M47816 Spondylosis without myelopathy or radiculopathy, lumbar region: Secondary | ICD-10-CM | POA: Diagnosis not present

## 2022-07-01 DIAGNOSIS — M25562 Pain in left knee: Secondary | ICD-10-CM | POA: Diagnosis not present

## 2022-07-01 DIAGNOSIS — M160 Bilateral primary osteoarthritis of hip: Secondary | ICD-10-CM | POA: Diagnosis not present

## 2022-07-01 DIAGNOSIS — M461 Sacroiliitis, not elsewhere classified: Secondary | ICD-10-CM | POA: Diagnosis not present

## 2022-07-01 DIAGNOSIS — S83232A Complex tear of medial meniscus, current injury, left knee, initial encounter: Secondary | ICD-10-CM | POA: Diagnosis not present

## 2022-07-15 DIAGNOSIS — M25462 Effusion, left knee: Secondary | ICD-10-CM | POA: Diagnosis not present

## 2022-07-15 DIAGNOSIS — S83242A Other tear of medial meniscus, current injury, left knee, initial encounter: Secondary | ICD-10-CM | POA: Diagnosis not present

## 2022-08-19 DIAGNOSIS — M25562 Pain in left knee: Secondary | ICD-10-CM | POA: Diagnosis not present

## 2022-09-15 DIAGNOSIS — Z419 Encounter for procedure for purposes other than remedying health state, unspecified: Secondary | ICD-10-CM | POA: Diagnosis not present

## 2022-10-16 DIAGNOSIS — Z419 Encounter for procedure for purposes other than remedying health state, unspecified: Secondary | ICD-10-CM | POA: Diagnosis not present

## 2022-10-27 IMAGING — US US ABDOMEN COMPLETE
1 series · 14 of 25 positions shown · non-contrast
Comparison: 03/08/2019

CLINICAL DATA: Pain right upper quadrant

EXAM:
ABDOMEN ULTRASOUND COMPLETE

[Series 1: us abdomen complete · 0.18mm/px · 14 of 84 slices shown]
[im 1/84]
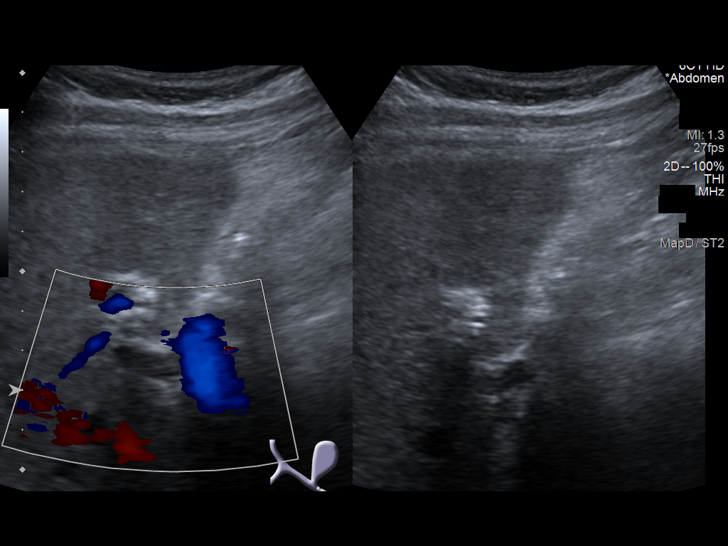
[im 7/84]
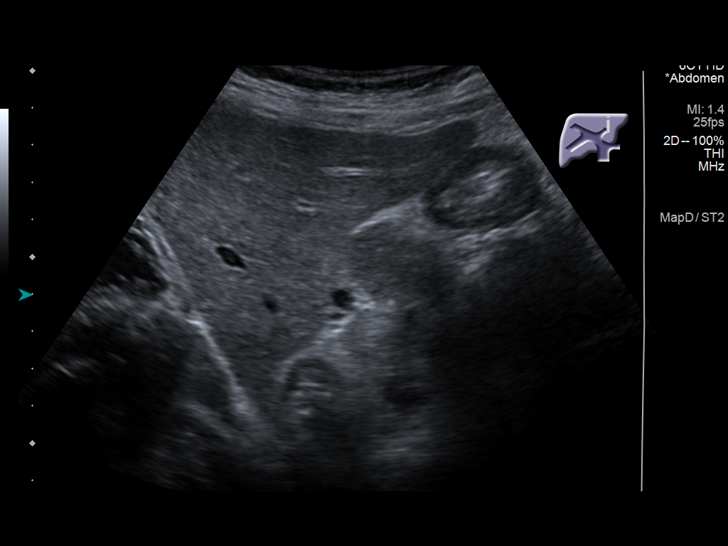
[im 14/84]
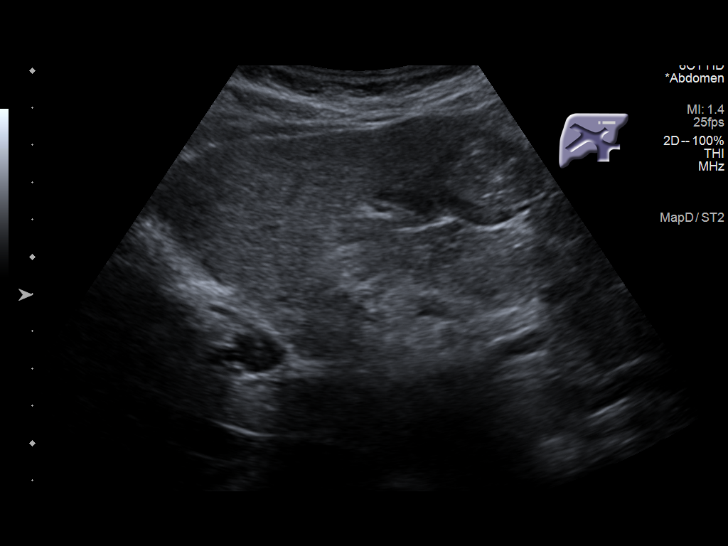
[im 21/84]
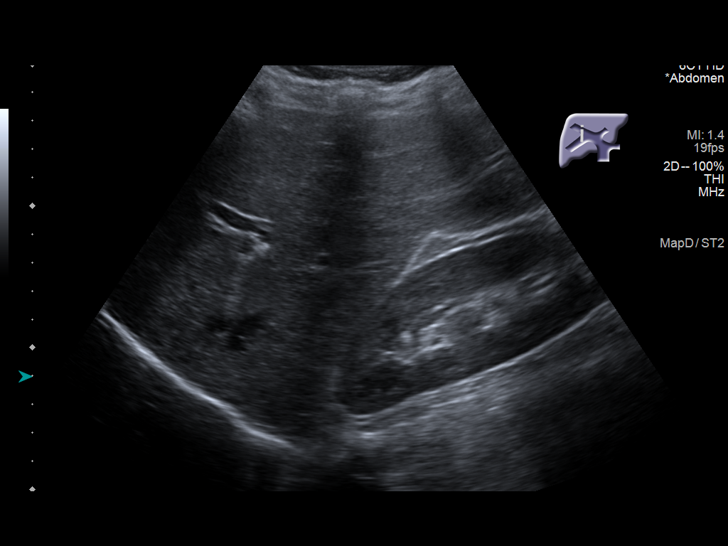
[im 28/84]
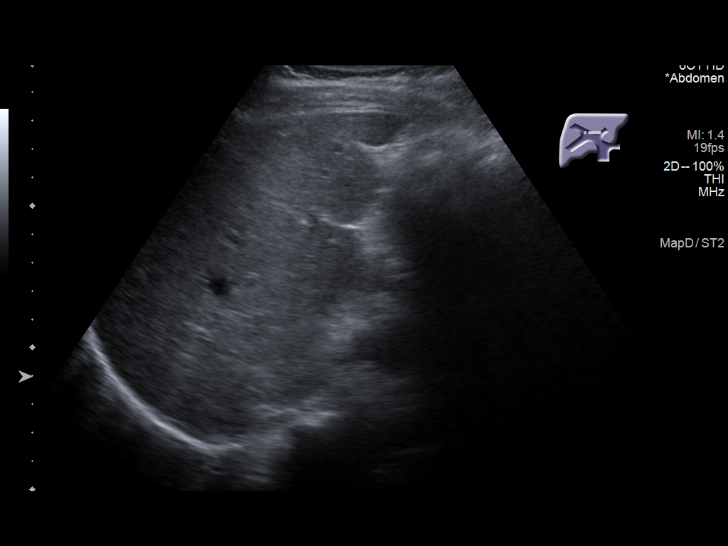
[im 32/84]
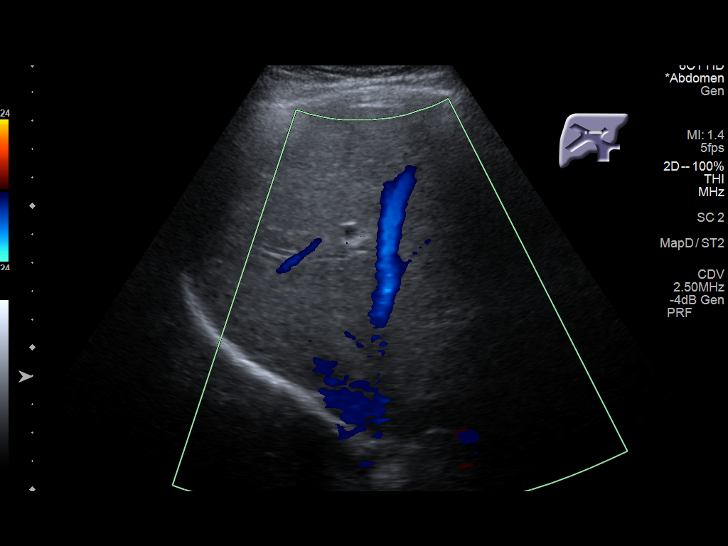
[im 39/84]
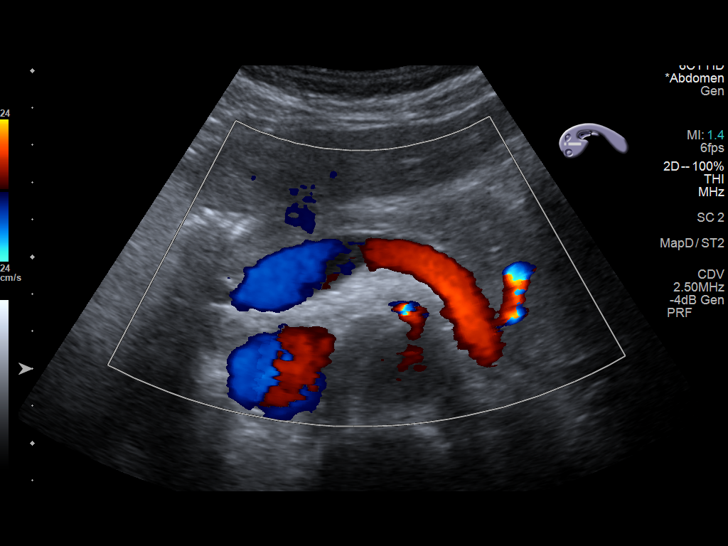
[im 45/84]
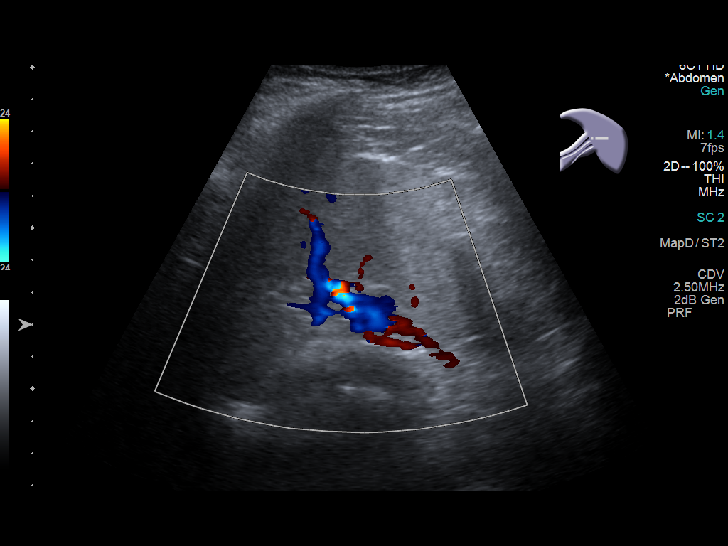
[im 52/84]
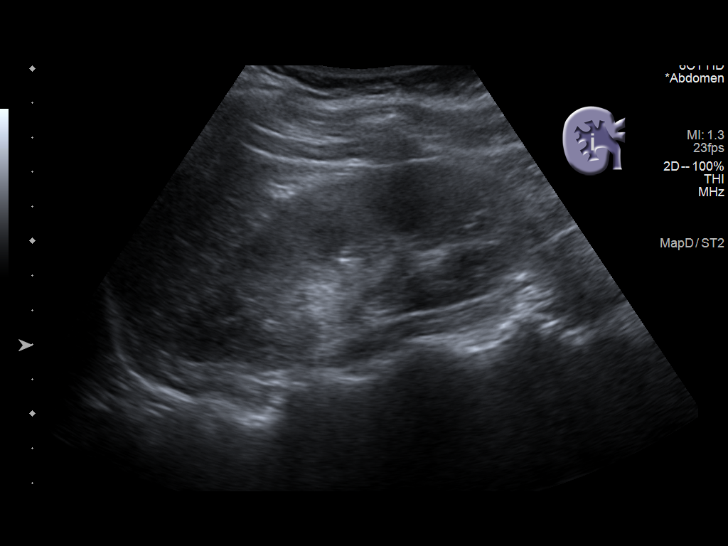
[im 56/84]
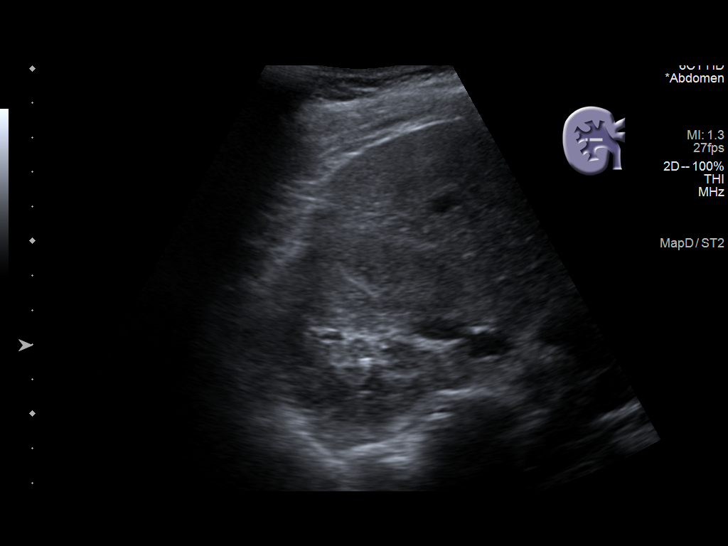
[im 63/84]
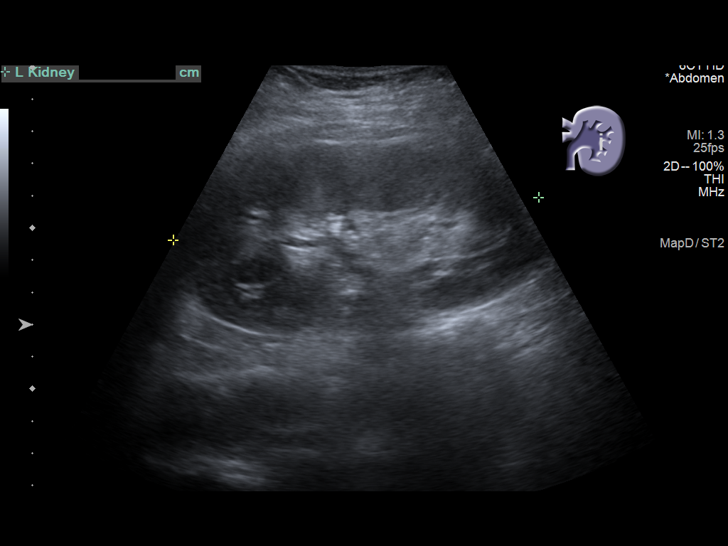
[im 70/84]
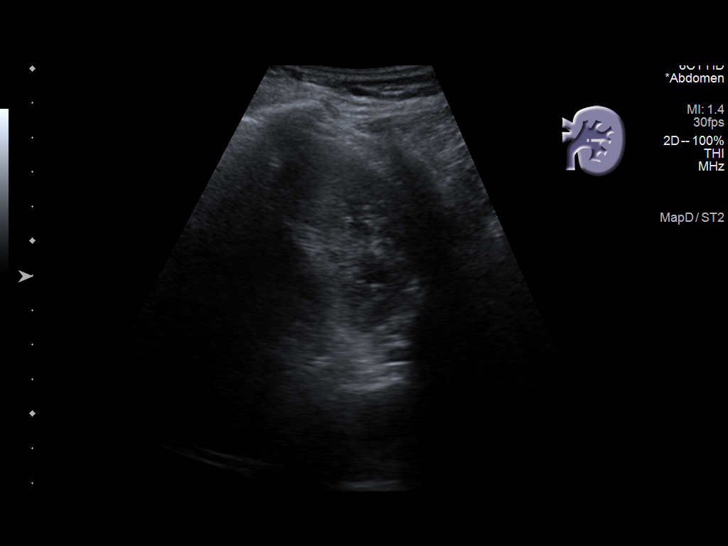
[im 77/84]
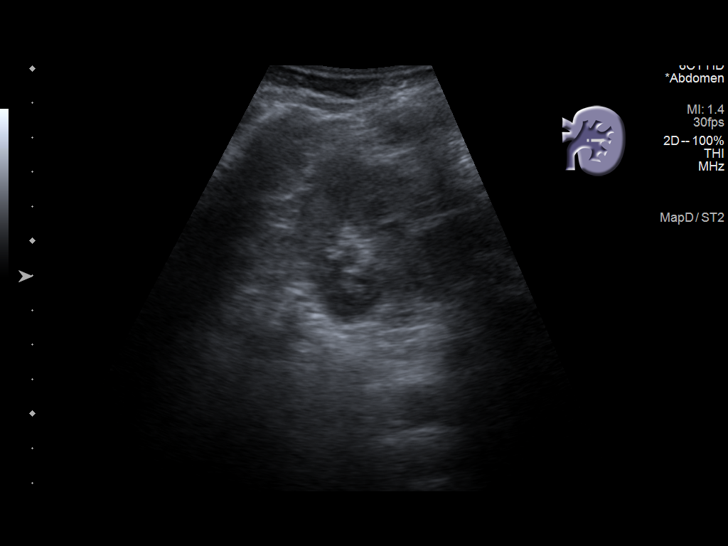
[im 84/84]
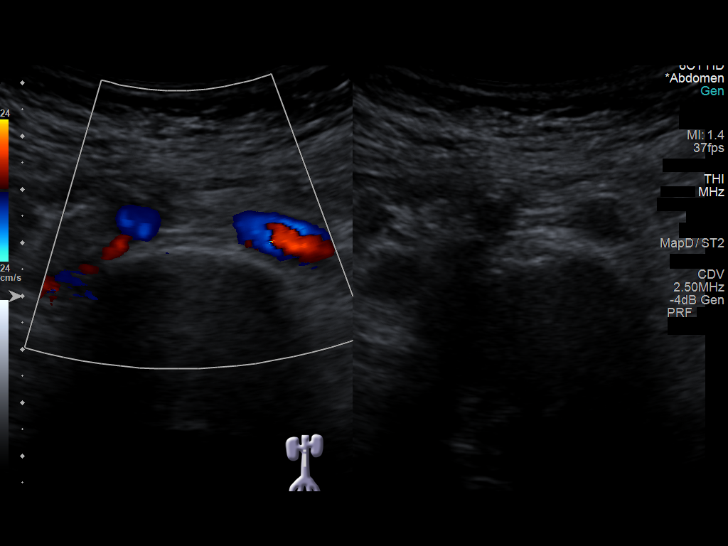

[14 of 25 positions shown; findings below may reference images not displayed]

FINDINGS: Gallbladder: Gallbladder is not seen consistent with previous
cholecystectomy.

Common bile duct: Diameter: 6 mm.

Liver: There is slightly increased echogenicity in the liver. No
focal abnormality is seen. Portal vein is patent on color Doppler
imaging with normal direction of blood flow towards the liver.

IVC: No abnormality visualized.

Pancreas: Visualized portion unremarkable.

Spleen: Size and appearance within normal limits.

Right Kidney: Length: 11.5 cm. Echogenicity within normal limits. No
mass or hydronephrosis visualized.

Left Kidney: Length: 11.4 cm. Echogenicity within normal limits. No
mass or hydronephrosis visualized.

Abdominal aorta: No aneurysm visualized.

Other findings: None.
IMPRESSION: Status post cholecystectomy. Possible fatty infiltration in the
liver. Abdominal sonogram is otherwise unremarkable.

## 2022-10-27 IMAGING — US US PELVIS COMPLETE WITH TRANSVAGINAL
1 series · 12 of 25 positions shown · non-contrast
Comparison: Pelvic sonogram done on 07/11/2021, CT done on
09/01/2021
COMPARISON: Pelvic sonogram done on 07/11/2021, CT done on
09/01/2021

Addendum:
CLINICAL DATA: Ovarian cysts

EXAM:
TRANSABDOMINAL AND TRANSVAGINAL ULTRASOUND OF PELVIS
DOPPLER ULTRASOUND OF OVARIES
TECHNIQUE: Both transabdominal and transvaginal ultrasound examinations of the
pelvis were performed. Transabdominal technique was performed for
global imaging of the pelvis including uterus, ovaries, adnexal
regions, and pelvic cul-de-sac.
It was necessary to proceed with endovaginal exam following the
transabdominal exam to visualize the ovaries. Color and duplex
Doppler ultrasound was utilized to evaluate blood flow to the
ovaries.

[Series 1: us pelvis complete with transvaginal · 0.22mm/px · 12 of 93 slices shown]
[im 4/93]
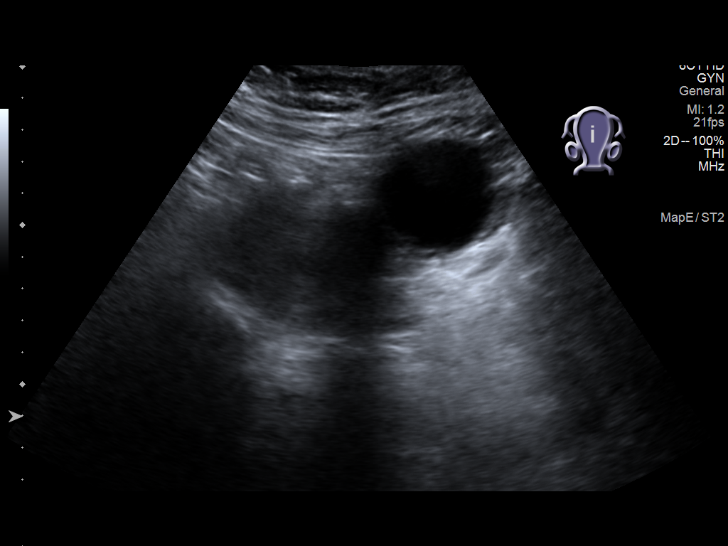
[im 12/93]
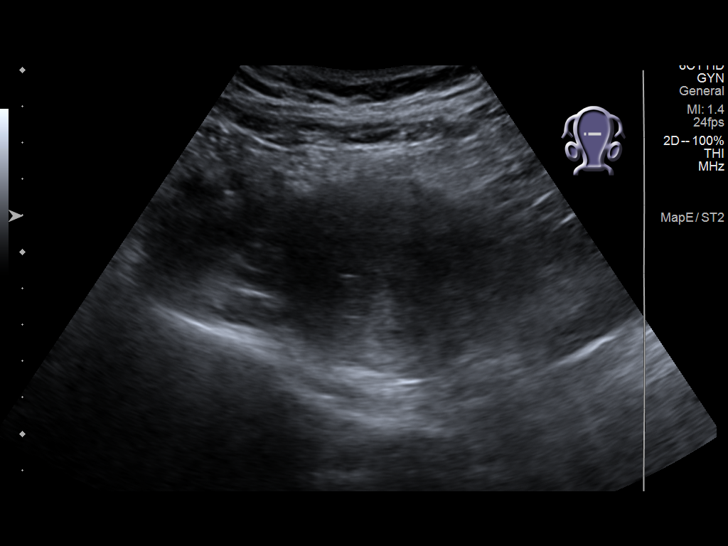
[im 20/93]
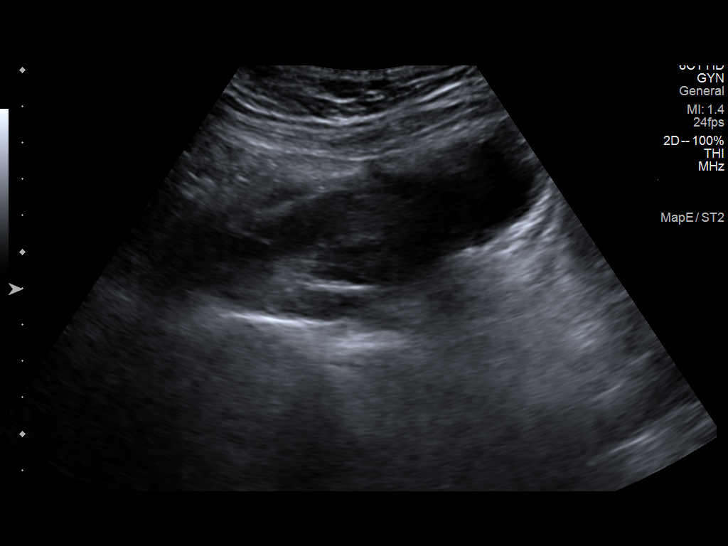
[im 27/93]
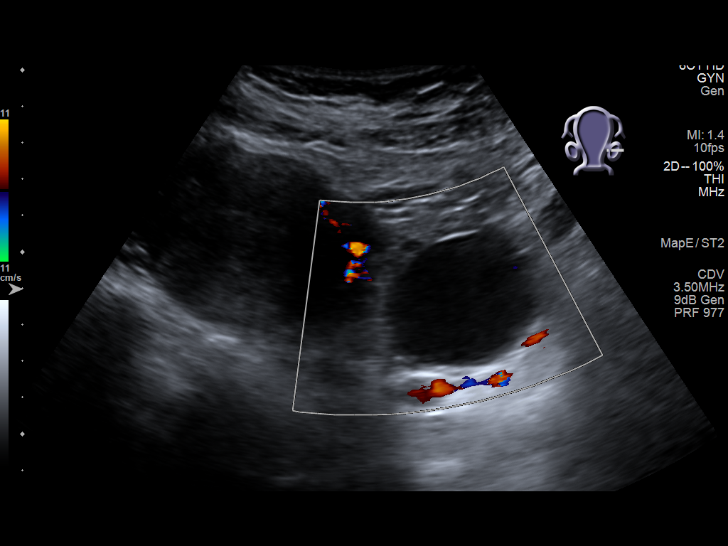
[im 35/93]
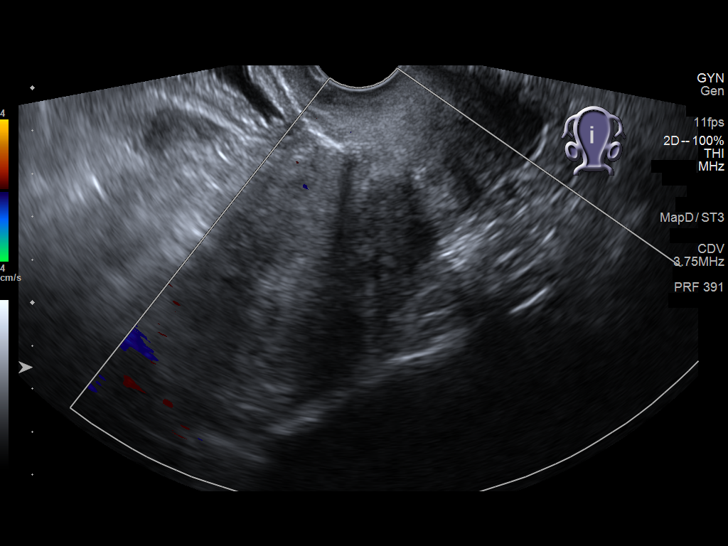
[im 43/93]
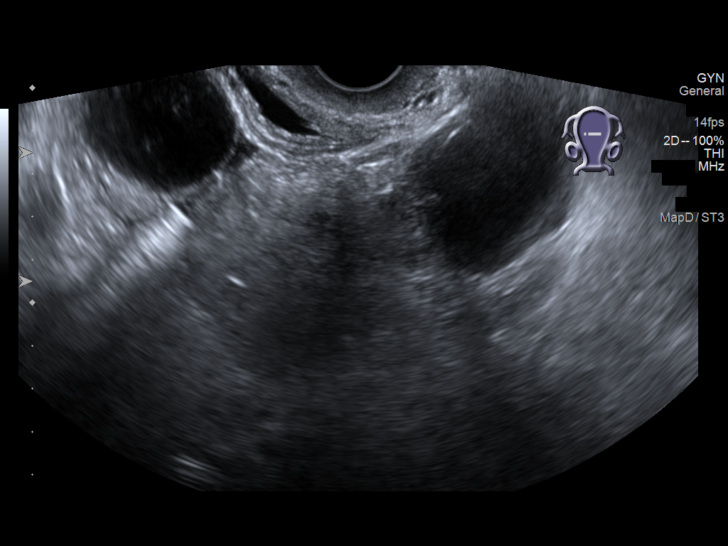
[im 50/93]
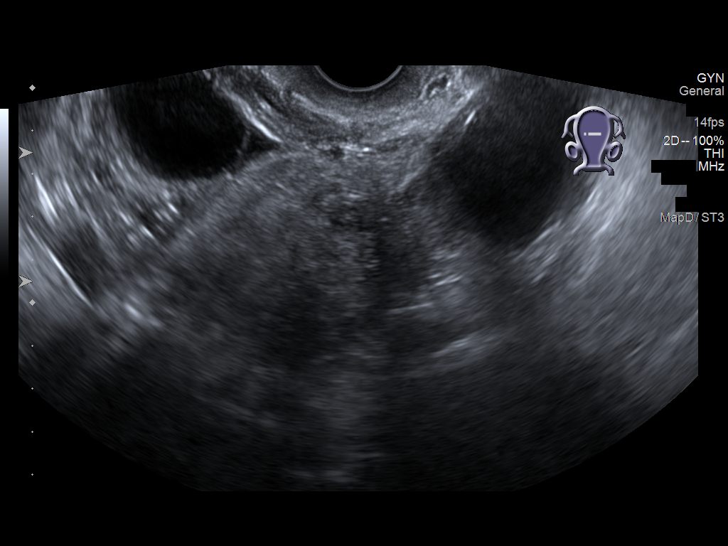
[im 58/93]
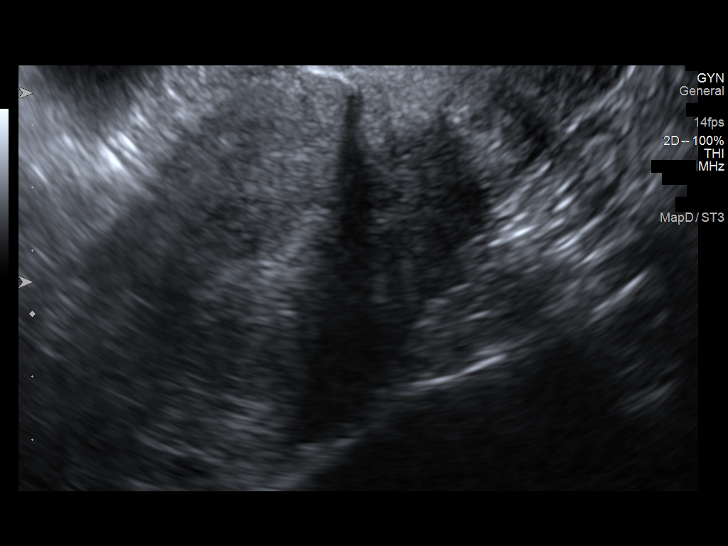
[im 66/93]
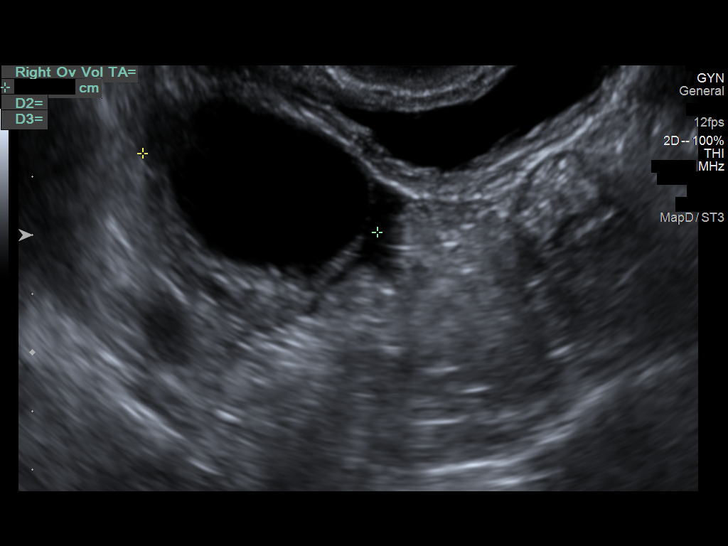
[im 73/93]
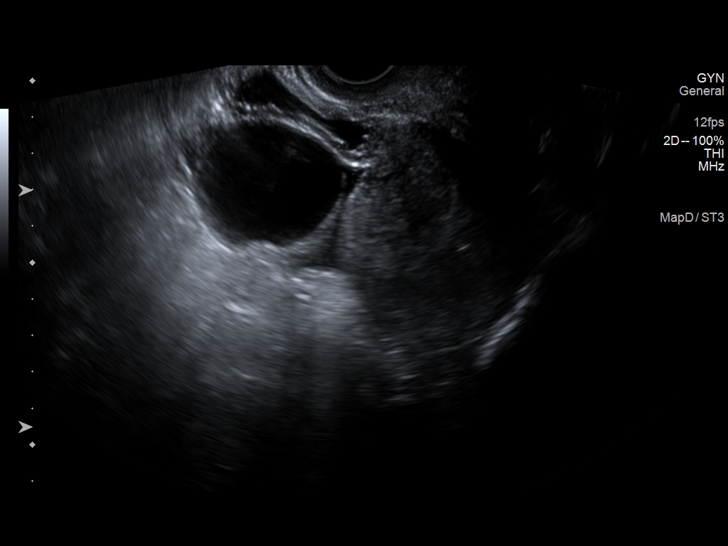
[im 81/93]
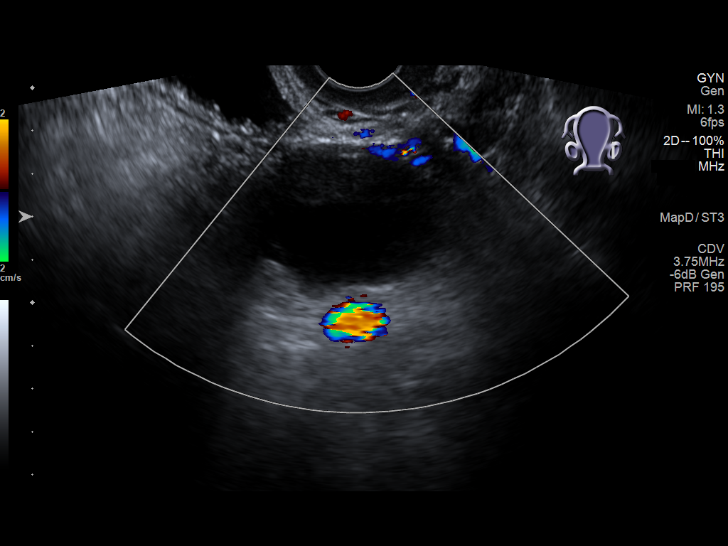
[im 89/93]
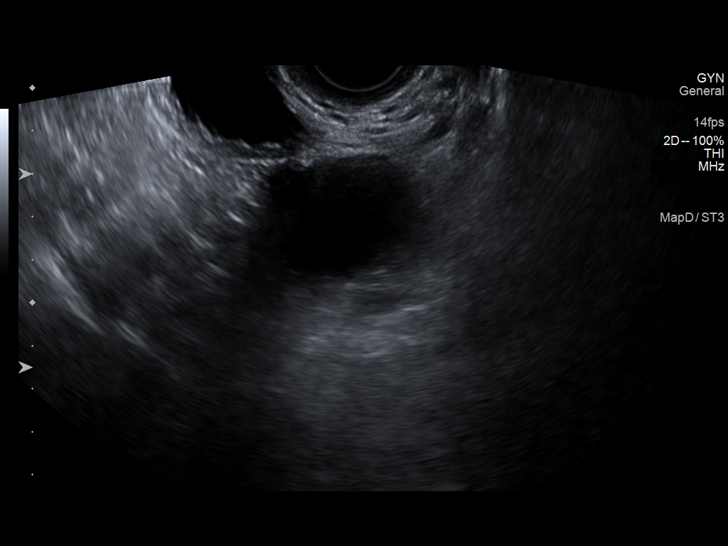

[12 of 25 positions shown; findings below may reference images not displayed]

FINDINGS: Uterus

Measurements: 10.1 x 5.1 x 6.4 cm = volume: 169.9 mL. There is
cm hypoechoic structure in the body suggesting possible small
fibroid.

Endometrium

Thickness: There is slight thickening of endometrial stripe in the
fundus of the uterus measuring up to 9 mm. Endometrial stripe in the
body measures 3 mm. There is no abnormal increased vascularity in
the endometrium.

Right ovary

Measurements: 4.2 x 3.9 x 3.9 cm = volume: 33.3 mL. There is 3.5 x
3.3 cm cyst

Left ovary

Measurements: 4.5 x 3.5 x 4.9 cm = volume: 41 mL. There is 4 cm cyst
in the left ovary.

Pulsed Doppler evaluation of both ovaries demonstrates normal
low-resistance arterial and venous waveforms.

Other findings

No abnormal free fluid.
IMPRESSION: There are simple appearing cysts in both adnexal regions suggesting
functional ovarian cysts.

There is slightly inhomogeneous echogenicity in the myometrium with
possible small 1.2 cm fibroid. There is prominence of endometrial
stripe in the fundus, possibly suggesting secretory phase of
menstrual cycle. Follow-up pelvic sonogram in the 1-2 months may be
considered to re-evaluate this finding.

ADDENDUM:
This addendum is made to clarify the Doppler technique used for the
study. Color flow Doppler was performed. Pulsed Doppler was not
performed.

*** End of Addendum ***
FINDINGS: Uterus

Measurements: 10.1 x 5.1 x 6.4 cm = volume: 169.9 mL. There is
cm hypoechoic structure in the body suggesting possible small
fibroid.

Endometrium

Thickness: There is slight thickening of endometrial stripe in the
fundus of the uterus measuring up to 9 mm. Endometrial stripe in the
body measures 3 mm. There is no abnormal increased vascularity in
the endometrium.

Right ovary

Measurements: 4.2 x 3.9 x 3.9 cm = volume: 33.3 mL. There is 3.5 x
3.3 cm cyst

Left ovary

Measurements: 4.5 x 3.5 x 4.9 cm = volume: 41 mL. There is 4 cm cyst
in the left ovary.

Pulsed Doppler evaluation of both ovaries demonstrates normal
low-resistance arterial and venous waveforms.

Other findings

No abnormal free fluid.
IMPRESSION: There are simple appearing cysts in both adnexal regions suggesting
functional ovarian cysts.

There is slightly inhomogeneous echogenicity in the myometrium with
possible small 1.2 cm fibroid. There is prominence of endometrial
stripe in the fundus, possibly suggesting secretory phase of
menstrual cycle. Follow-up pelvic sonogram in the 1-2 months may be
considered to re-evaluate this finding.

## 2022-10-28 DIAGNOSIS — M65862 Other synovitis and tenosynovitis, left lower leg: Secondary | ICD-10-CM | POA: Diagnosis not present

## 2022-10-28 DIAGNOSIS — M94262 Chondromalacia, left knee: Secondary | ICD-10-CM | POA: Diagnosis not present

## 2022-10-28 DIAGNOSIS — S83512A Sprain of anterior cruciate ligament of left knee, initial encounter: Secondary | ICD-10-CM | POA: Diagnosis not present

## 2022-10-28 DIAGNOSIS — S83242A Other tear of medial meniscus, current injury, left knee, initial encounter: Secondary | ICD-10-CM | POA: Diagnosis not present

## 2022-10-28 DIAGNOSIS — S83232A Complex tear of medial meniscus, current injury, left knee, initial encounter: Secondary | ICD-10-CM | POA: Diagnosis not present

## 2022-11-04 DIAGNOSIS — Z96652 Presence of left artificial knee joint: Secondary | ICD-10-CM | POA: Diagnosis not present

## 2022-11-04 DIAGNOSIS — S83232A Complex tear of medial meniscus, current injury, left knee, initial encounter: Secondary | ICD-10-CM | POA: Diagnosis not present

## 2022-11-13 ENCOUNTER — Encounter: Payer: Self-pay | Admitting: Radiology

## 2022-11-13 ENCOUNTER — Ambulatory Visit (HOSPITAL_COMMUNITY): Payer: Medicaid Other | Attending: Orthopedic Surgery

## 2022-11-13 ENCOUNTER — Other Ambulatory Visit: Payer: Self-pay

## 2022-11-13 DIAGNOSIS — S83232D Complex tear of medial meniscus, current injury, left knee, subsequent encounter: Secondary | ICD-10-CM | POA: Insufficient documentation

## 2022-11-13 DIAGNOSIS — G8929 Other chronic pain: Secondary | ICD-10-CM | POA: Insufficient documentation

## 2022-11-13 DIAGNOSIS — M25562 Pain in left knee: Secondary | ICD-10-CM | POA: Insufficient documentation

## 2022-11-13 DIAGNOSIS — R262 Difficulty in walking, not elsewhere classified: Secondary | ICD-10-CM | POA: Insufficient documentation

## 2022-11-13 NOTE — Therapy (Signed)
OUTPATIENT PHYSICAL THERAPY LOWER EXTREMITY EVALUATION   Patient Name: Amber Russell MRN: [id number] DOB:Nov 21, 1970, 52 y.o., female Today's Date: 11/13/2022  END OF SESSION:  PT End of Session - 11/13/22 0819     Visit Number 1    Number of Visits 8    Date for PT Re-Evaluation 12/11/22    Authorization Type Managed Medicaid; [employer] please check auth    Authorization - Visit Number 0    PT Start Time 0820    PT Stop Time 0900    PT Time Calculation (min) 40 min    Activity Tolerance Patient tolerated treatment well    Behavior During Therapy [facility] for tasks assessed/performed             Past Medical History:  Diagnosis Date   Contraceptive management 02/15/2015   GERD without esophagitis    Hep C w/o coma, chronic (HCC)    Hx of migraines    Nexplanon removal 02/15/2015   Past Surgical History:  Procedure Laterality Date   BILE DUCT EXPLORATION N/A    CESAREAN SECTION     CESAREAN SECTION     age 57   CHOLECYSTECTOMY     COLONOSCOPY WITH PROPOFOL N/A 07/03/2021   Procedure: COLONOSCOPY WITH PROPOFOL;  Surgeon: Harvel Quale, MD;  Location: [facility];  Service: Gastroenterology;  Laterality: N/A;  10:05 AM   KNEE ARTHROPLASTY     POLYPECTOMY  07/03/2021   Procedure: POLYPECTOMY INTESTINAL;  Surgeon: Harvel Quale, MD;  Location: [facility];  Service: Gastroenterology;;   Patient Active Problem List   Diagnosis Date Noted   RLQ abdominal pain 01/13/2022   Missed period 09/20/2021   Fatty liver 09/20/2021   RUQ pain 09/05/2021   Cysts of both ovaries 09/05/2021   Hot flashes 06/10/2021   Pelvic pain in female 06/10/2021   Encounter for screening fecal occult blood testing 06/10/2021   Visit for gynecologic examination 06/10/2021   History of ovarian cyst 06/10/2021   Peri-menopause 06/10/2021   Cyst of left ovary 07/11/2020   Fibroids 07/11/2020   Encounter for IUD removal 07/11/2020   Encounter for IUD insertion  06/04/2020   Migraines, neuralgic 12/14/2018   Vitamin D deficiency, unspecified 12/14/2018   Fatigue 12/14/2018   Perennial and seasonal allergic rhinitis 01/29/2016   Seasonal allergic conjunctivitis 01/29/2016   Moderate persistent asthma with mild exacerbation 01/29/2016   TMJ arthralgia 12/27/2012   Varicose veins of lower extremity with inflammation 06/16/2012   Chronic hepatitis C ([place]) 06/06/2011    PCP: Neale Burly  REFERRING PROVIDER: Liz Malady, PA-C; Dr. Robby Sermon  REFERRING DIAG: S/P LEFT MEDIAL PARTIAL MENISCETOMY  THERAPY DIAG:  Chronic pain of left knee  Difficulty in walking, not elsewhere classified  Complex tear of medial meniscus of left knee as current injury, subsequent encounter  Rationale for Evaluation and Treatment: Rehabilitation  ONSET DATE: 2022; surgery 10/28/2022  SUBJECTIVE:   SUBJECTIVE STATEMENT: Left knee started hurting back in 2022; saw Dr. Aline Brochure; x-ray; referred to therapy; no improvement so then PCP referred to [facility].  MRI; set up for surgery.  10/28/2022; same day surgery.  No weight on it for 2 weeks then referred to outpatient therapy  PERTINENT HISTORY: no PAIN:  Are you having pain? Yes: NPRS scale: 4-5/10 Pain location: left knee Pain description: sore and tight Aggravating factors: standing, walking Relieving factors: rest; medication  PRECAUTIONS: None  WEIGHT BEARING RESTRICTIONS: No  FALLS:  Has patient fallen in last 6 months? Yes.  Number of falls 1  right after surgery had a fall  LIVING ENVIRONMENT: Lives with: lives with their family Lives in: House/apartment Stairs: Yes: External: 4 steps; on left going up Has following equipment at home: Crutches  OCCUPATION: works at [employer] not sure if will return  PLOF: Independent  PATIENT GOALS: to get my knee to feel better  NEXT MD VISIT: 12/09/2022  OBJECTIVE:   DIAGNOSTIC FINDINGS: x-ray per [facility] 2022; had MRI but cannot see  PATIENT  SURVEYS:  FOTO 37  COGNITION: Overall cognitive status: Within functional limits for tasks assessed     SENSATION: WFL  EDEMA:  Mild left knee    PALPATION: General soreness left knee  LOWER EXTREMITY ROM:  Active ROM Right eval Left eval  Hip flexion    Hip extension    Hip abduction    Hip adduction    Hip internal rotation    Hip external rotation    Knee flexion 134 86  Knee extension 0 -4  Ankle dorsiflexion    Ankle plantarflexion    Ankle inversion    Ankle eversion     (Blank rows = not tested)  LOWER EXTREMITY MMT:  MMT Right eval Left eval  Hip flexion 5 4+  Hip extension    Hip abduction    Hip adduction    Hip internal rotation    Hip external rotation    Knee flexion    Knee extension 5 3-  Ankle dorsiflexion 5 4+  Ankle plantarflexion    Ankle inversion    Ankle eversion     (Blank rows = not tested)    FUNCTIONAL TESTS:  5 times sit to stand: 21.71 sec using hands on thighs to push up  GAIT: Distance walked: 40 ft Assistive device utilized: None Level of assistance: Modified independence Comments: antalgic gait decreased stance left leg; decreased heel strike and toe off   TODAY'S TREATMENT:                                                                                                                              DATE: 11/13/22 physical therapy evaluation and HEP instruction    PATIENT EDUCATION:  Education details: Patient educated on exam findings, POC, scope of PT, HEP. Person educated: Patient Education method: Explanation, Demonstration, and Handouts Education comprehension: verbalized understanding, returned demonstration, verbal cues required, and tactile cues required   HOME EXERCISE PROGRAM: Access Code: [email or web address] URL: [email or web address] Date: 11/13/2022 Prepared by: [facility]  Exercises - Seated Heel Slide  - 2 x daily - 7 x weekly - 1 sets - 10 reps - Seated Long Arc Quad  - 2 x daily - 7  x weekly - 1 sets - 10 reps - Supine Heel Slide  - 2 x daily - 7 x weekly - 1 sets - 10 reps - Supine Quad Set  - 2 x daily - 7 x weekly - 1 sets - 10  reps  ASSESSMENT:  CLINICAL IMPRESSION: Patient is a 52 y.o. pleasant female who was seen today for physical therapy evaluation and treatment for s/p left knee medial meniscus tear.  . Patient  presents to physical therapy with complaint of left knee pain, stiffness, weakness and difficulty walking. Patient demonstrates muscle weakness, reduced ROM, and fascial restrictions which are likely contributing to symptoms of pain and are negatively impacting patient ability to perform ADLs and functional mobility tasks. Patient will benefit from skilled physical therapy services to address these deficits to reduce pain and improve level of function with ADLs and functional mobility tasks.   OBJECTIVE IMPAIRMENTS: Abnormal gait, decreased activity tolerance, decreased endurance, decreased mobility, difficulty walking, decreased ROM, decreased strength, hypomobility, increased edema, increased fascial restrictions, impaired perceived functional ability, impaired flexibility, and pain.   ACTIVITY LIMITATIONS: carrying, lifting, bending, standing, squatting, stairs, transfers, locomotion level, and caring for others  PARTICIPATION LIMITATIONS: meal prep, cleaning, shopping, community activity, and yard work  [employer] POTENTIAL: Good  CLINICAL DECISION MAKING: Stable/uncomplicated  EVALUATION COMPLEXITY: Low   GOALS: Goals reviewed with patient? No  SHORT TERM GOALS: Target date: 11/27/2022 patient will be independent with initial HEP  Baseline: Goal status: INITIAL  2.  Patient will increase left knee mobility to -2 to 110 degrees to improve ability to navigate uneven surfaces safely.  Baseline: see above Goal status: INITIAL   LONG TERM GOALS: Target date: 12/11/2022  Patient will be independent in self management strategies to improve  quality of life and functional outcomes.   Baseline:  Goal status: INITIAL  2.  Patient will improve FOTO score to predicted value Baseline: 37 Goal status: INITIAL  3.  Patient will increase  leg MMTs to 5/5 without pain to promote return to ambulation community distances with minimal deviation.  Baseline: see above Goal status: INITIAL  4.  Patient will increased knee mobility to 0 to 125 to promote normal navigation of steps; step over step pattern   Baseline: see above Goal status: INITIAL  5.  Patient will improve 5 times sit to stand score from 21.71 sec to 15 sec to demonstrate improved functional mobility and increased lower extremity strength.  Baseline:  Goal status: INITIAL    PLAN:  PT FREQUENCY: 2x/week  PT DURATION: 4 weeks  PLANNED INTERVENTIONS: Therapeutic exercises, Therapeutic activity, Neuromuscular re-education, Balance training, Gait training, Patient/Family education, Joint manipulation, Joint mobilization, Stair training, Orthotic/Fit training, DME instructions, Aquatic Therapy, Dry Needling, Electrical stimulation, Spinal manipulation, Spinal mobilization, Cryotherapy, Moist heat, Compression bandaging, scar mobilization, Splintting, Taping, Traction, Ultrasound, Ionotophoresis '4mg'$ /ml Dexamethasone, and Manual therapy   PLAN FOR NEXT SESSION: Review HEP and goals; progress knee mobility and strength as able   9:03 AM, 11/13/22 Delmos Velaquez Small [place]ony Barner MPT [place] [facility]  [phone] [id number]

## 2022-11-14 DIAGNOSIS — Z419 Encounter for procedure for purposes other than remedying health state, unspecified: Secondary | ICD-10-CM | POA: Diagnosis not present

## 2022-11-17 ENCOUNTER — Ambulatory Visit (HOSPITAL_COMMUNITY): Payer: Medicaid Other | Attending: Orthopedic Surgery | Admitting: Physical Therapy

## 2022-11-17 DIAGNOSIS — R262 Difficulty in walking, not elsewhere classified: Secondary | ICD-10-CM | POA: Diagnosis not present

## 2022-11-17 DIAGNOSIS — M6281 Muscle weakness (generalized): Secondary | ICD-10-CM | POA: Diagnosis not present

## 2022-11-17 DIAGNOSIS — S83232D Complex tear of medial meniscus, current injury, left knee, subsequent encounter: Secondary | ICD-10-CM

## 2022-11-17 DIAGNOSIS — M25562 Pain in left knee: Secondary | ICD-10-CM | POA: Diagnosis not present

## 2022-11-17 DIAGNOSIS — G8929 Other chronic pain: Secondary | ICD-10-CM | POA: Diagnosis not present

## 2022-11-17 NOTE — Therapy (Signed)
OUTPATIENT PHYSICAL THERAPY TREATMENT   Patient Name: Amber Russell MRN: [id number] DOB:1971-06-04, 52 y.o., female Today's Date: 11/17/2022  END OF SESSION:  PT End of Session - 11/17/22 1542     Visit Number 2    Number of Visits 8    Date for PT Re-Evaluation 12/11/22    Authorization Type Managed Medicaid; [place] Time Period 8 visits 2/29-4/29    Authorization - Visit Number 1    Authorization - Number of Visits 8    Progress Note Due on Visit 8    PT Start Time 1600    PT Stop Time 1640    PT Time Calculation (min) 40 min    Activity Tolerance Patient tolerated treatment well    Behavior During Therapy [facility] for tasks assessed/performed             Past Medical History:  Diagnosis Date   Contraceptive management 02/15/2015   GERD without esophagitis    Hep C w/o coma, chronic (HCC)    Hx of migraines    Nexplanon removal 02/15/2015   Past Surgical History:  Procedure Laterality Date   BILE DUCT EXPLORATION N/A    CESAREAN SECTION     CESAREAN SECTION     age 64   CHOLECYSTECTOMY     COLONOSCOPY WITH PROPOFOL N/A 07/03/2021   Procedure: COLONOSCOPY WITH PROPOFOL;  Surgeon: Harvel Quale, MD;  [facility];  Service: Gastroenterology;  Laterality: N/A;  10:05 AM   KNEE ARTHROPLASTY     POLYPECTOMY  07/03/2021   Procedure: POLYPECTOMY INTESTINAL;  Surgeon: Harvel Quale, MD;  Location: [facility];  Service: Gastroenterology;;   Patient Active Problem List   Diagnosis Date Noted   RLQ abdominal pain 01/13/2022   Missed period 09/20/2021   Fatty liver 09/20/2021   RUQ pain 09/05/2021   Cysts of both ovaries 09/05/2021   Hot flashes 06/10/2021   Pelvic pain in female 06/10/2021   Encounter for screening fecal occult blood testing 06/10/2021   Visit for gynecologic examination 06/10/2021   History of ovarian cyst 06/10/2021   Peri-menopause 06/10/2021   Cyst of left ovary 07/11/2020   Fibroids  07/11/2020   Encounter for IUD removal 07/11/2020   Encounter for IUD insertion 06/04/2020   Migraines, neuralgic 12/14/2018   Vitamin D deficiency, unspecified 12/14/2018   Fatigue 12/14/2018   Perennial and seasonal allergic rhinitis 01/29/2016   Seasonal allergic conjunctivitis 01/29/2016   Moderate persistent asthma with mild exacerbation 01/29/2016   TMJ arthralgia 12/27/2012   Varicose veins of lower extremity with inflammation 06/16/2012   Chronic hepatitis C ([place]) 06/06/2011    PCP: Neale Burly  REFERRING PROVIDER: Liz Malady, PA-C; Dr. Robby Sermon  REFERRING DIAG: S/P LEFT MEDIAL PARTIAL MENISCETOMY  THERAPY DIAG:  Chronic pain of left knee  Difficulty in walking, not elsewhere classified  Complex tear of medial meniscus of left knee as current injury, subsequent encounter  Muscle weakness (generalized)  Rationale for Evaluation and Treatment: Rehabilitation  ONSET DATE: 2022; surgery 10/28/2022  SUBJECTIVE:   SUBJECTIVE STATEMENT:  Pt comes today accompanied by daughter.  Reports 2/10 pain in Lt knee.  Completing her HEP  Evaluation: Left knee started hurting back in 2022; saw Dr. Aline Brochure; x-ray; referred to therapy; no improvement so then PCP referred to [facility].  MRI; set up for surgery.  10/28/2022; same day surgery.  No weight on it for 2 weeks then referred to outpatient therapy  PERTINENT HISTORY: no  PAIN:  Are you having pain? Yes: NPRS scale: 4-5/10 Pain location: left knee Pain description: sore and tight Aggravating factors: standing, walking Relieving factors: rest; medication  PRECAUTIONS: None  WEIGHT BEARING RESTRICTIONS: No  FALLS:  Has patient fallen in last 6 months? Yes. Number of falls 1  right after surgery had a fall  LIVING ENVIRONMENT: Lives with: lives with their family Lives in: House/apartment Stairs: Yes: External: 4 steps; on left going up Has following equipment at home: Crutches  OCCUPATION: works at [employer]  not sure if will return  PLOF: Independent  PATIENT GOALS: to get my knee to feel better  NEXT MD VISIT: 12/09/2022  OBJECTIVE:   DIAGNOSTIC FINDINGS: x-ray per [facility] 2022; had MRI but cannot see  PATIENT SURVEYS:  FOTO 37  COGNITION: Overall cognitive status: Within functional limits for tasks assessed     SENSATION: WFL  EDEMA:  Mild left knee    PALPATION: General soreness left knee  LOWER EXTREMITY ROM:  Active ROM Right eval Left eval  Hip flexion    Hip extension    Hip abduction    Hip adduction    Hip internal rotation    Hip external rotation    Knee flexion 134 86  Knee extension 0 -4  Ankle dorsiflexion    Ankle plantarflexion    Ankle inversion    Ankle eversion     (Blank rows = not tested)  LOWER EXTREMITY MMT:  MMT Right eval Left eval  Hip flexion 5 4+  Hip extension    Hip abduction    Hip adduction    Hip internal rotation    Hip external rotation    Knee flexion    Knee extension 5 3-  Ankle dorsiflexion 5 4+  Ankle plantarflexion    Ankle inversion    Ankle eversion     (Blank rows = not tested)    FUNCTIONAL TESTS:  5 times sit to stand: 21.71 sec using hands on thighs to push up  GAIT: Distance walked: 40 ft Assistive device utilized: None Level of assistance: Modified independence Comments: antalgic gait decreased stance left leg; decreased heel strike and toe off   TODAY'S TREATMENT:                                                                                                                              DATE:  11/17/22 Bike seat9  5 minutes full revolutions Standing:  Lt knee flexion 10X10" onto 12" step  Heelraises 10X  Knee flexion 10X Seated:  heelslides 10X  Long arc quad 10X5"  Sit to stands 10X no UE Supine:  heelslides  Quad sets 10X5" AROM: 0-120  11/13/22 physical therapy evaluation and HEP instruction    PATIENT EDUCATION:  Education details: Patient educated on exam findings, POC,  scope of PT, HEP. Person educated: Patient Education method: Explanation, Demonstration, and Handouts Education comprehension: verbalized understanding, returned demonstration, verbal cues required, and tactile  cues required   HOME EXERCISE PROGRAM: Access Code: [email or web address] URL: [email or web address] Date: 11/13/2022 Prepared by: [facility]  Exercises - Seated Heel Slide  - 2 x daily - 7 x weekly - 1 sets - 10 reps - Seated Long Arc Quad  - 2 x daily - 7 x weekly - 1 sets - 10 reps - Supine Heel Slide  - 2 x daily - 7 x weekly - 1 sets - 10 reps - Supine Quad Set  - 2 x daily - 7 x weekly - 1 sets - 10 reps  ASSESSMENT:  CLINICAL IMPRESSION: Began session with bike with ability to complete full revolutions following a few repetitions of rocking.  Standing exericises began without significant challenge.  Pt able to recall established exercises without complaint of pain. Improved AROM to 120 degrees today.  Pt with min extension lag completing SLR due to quad weakness, however AROM established into neutral.    Pt will continue to benefit from skilled physical therapy services to address deficits, reduce pain and improve level of function with ADLs and functional mobility tasks.   OBJECTIVE IMPAIRMENTS: Abnormal gait, decreased activity tolerance, decreased endurance, decreased mobility, difficulty walking, decreased ROM, decreased strength, hypomobility, increased edema, increased fascial restrictions, impaired perceived functional ability, impaired flexibility, and pain.   ACTIVITY LIMITATIONS: carrying, lifting, bending, standing, squatting, stairs, transfers, locomotion level, and caring for others  PARTICIPATION LIMITATIONS: meal prep, cleaning, shopping, community activity, and yard work  [employer] POTENTIAL: Good  CLINICAL DECISION MAKING: Stable/uncomplicated  EVALUATION COMPLEXITY: Low   GOALS: Goals reviewed with patient? No  SHORT TERM GOALS: Target date:  11/27/2022 patient will be independent with initial HEP  Baseline: Goal status: INITIAL  2.  Patient will increase left knee mobility to -2 to 110 degrees to improve ability to navigate uneven surfaces safely.  Baseline: see above Goal status: INITIAL   LONG TERM GOALS: Target date: 12/11/2022  Patient will be independent in self management strategies to improve quality of life and functional outcomes.   Baseline:  Goal status: INITIAL  2.  Patient will improve FOTO score to predicted value Baseline: 37 Goal status: INITIAL  3.  Patient will increase  leg MMTs to 5/5 without pain to promote return to ambulation community distances with minimal deviation.  Baseline: see above Goal status: INITIAL  4.  Patient will increased knee mobility to 0 to 125 to promote normal navigation of steps; step over step pattern   Baseline: see above Goal status: INITIAL  5.  Patient will improve 5 times sit to stand score from 21.71 sec to 15 sec to demonstrate improved functional mobility and increased lower extremity strength.  Baseline:  Goal status: INITIAL    PLAN:  PT FREQUENCY: 2x/week  PT DURATION: 4 weeks  PLANNED INTERVENTIONS: Therapeutic exercises, Therapeutic activity, Neuromuscular re-education, Balance training, Gait training, Patient/Family education, Joint manipulation, Joint mobilization, Stair training, Orthotic/Fit training, DME instructions, Aquatic Therapy, Dry Needling, Electrical stimulation, Spinal manipulation, Spinal mobilization, Cryotherapy, Moist heat, Compression bandaging, scar mobilization, Splintting, Taping, Traction, Ultrasound, Ionotophoresis '4mg'$ /ml Dexamethasone, and Manual therapy   PLAN FOR NEXT SESSION: Progress knee mobility and strength as able   3:44 PM, 11/17/22 Teena Irani, PTA/CLT [place] Ph: [phone]

## 2022-11-18 ENCOUNTER — Encounter (HOSPITAL_COMMUNITY): Payer: Self-pay | Admitting: Physical Therapy

## 2022-11-21 ENCOUNTER — Ambulatory Visit (HOSPITAL_COMMUNITY): Payer: Medicaid Other | Admitting: Physical Therapy

## 2022-11-21 DIAGNOSIS — S83232D Complex tear of medial meniscus, current injury, left knee, subsequent encounter: Secondary | ICD-10-CM

## 2022-11-21 DIAGNOSIS — M6281 Muscle weakness (generalized): Secondary | ICD-10-CM | POA: Diagnosis not present

## 2022-11-21 DIAGNOSIS — M25562 Pain in left knee: Secondary | ICD-10-CM | POA: Diagnosis not present

## 2022-11-21 DIAGNOSIS — G8929 Other chronic pain: Secondary | ICD-10-CM | POA: Diagnosis not present

## 2022-11-21 DIAGNOSIS — R262 Difficulty in walking, not elsewhere classified: Secondary | ICD-10-CM | POA: Diagnosis not present

## 2022-11-21 NOTE — Therapy (Signed)
OUTPATIENT PHYSICAL THERAPY TREATMENT   Patient Name: Amber Russell MRN: [id number] DOB:1971-02-07, 52 y.o., female Today's Date: 11/21/2022  END OF SESSION:  PT End of Session - 11/21/22 1005     Visit Number 3    Number of Visits 8    Date for PT Re-Evaluation 12/11/22    Authorization Type Managed Medicaid; [place] Time Period 8 visits 2/29-4/29    Authorization - Visit Number 2    Authorization - Number of Visits 8    Progress Note Due on Visit 8    PT Start Time 0950    PT Stop Time 1030    PT Time Calculation (min) 40 min    Activity Tolerance Patient tolerated treatment well    Behavior During Therapy [facility] for tasks assessed/performed             Past Medical History:  Diagnosis Date   Contraceptive management 02/15/2015   GERD without esophagitis    Hep C w/o coma, chronic (HCC)    Hx of migraines    Nexplanon removal 02/15/2015   Past Surgical History:  Procedure Laterality Date   BILE DUCT EXPLORATION N/A    CESAREAN SECTION     CESAREAN SECTION     age 56   CHOLECYSTECTOMY     COLONOSCOPY WITH PROPOFOL N/A 07/03/2021   Procedure: COLONOSCOPY WITH PROPOFOL;  Surgeon: Harvel Quale, MD;  [facility];  Service: Gastroenterology;  Laterality: N/A;  10:05 AM   KNEE ARTHROPLASTY     POLYPECTOMY  07/03/2021   Procedure: POLYPECTOMY INTESTINAL;  Surgeon: Harvel Quale, MD;  [facility];  Service: Gastroenterology;;   Patient Active Problem List   Diagnosis Date Noted   RLQ abdominal pain 01/13/2022   Missed period 09/20/2021   Fatty liver 09/20/2021   RUQ pain 09/05/2021   Cysts of both ovaries 09/05/2021   Hot flashes 06/10/2021   Pelvic pain in female 06/10/2021   Encounter for screening fecal occult blood testing 06/10/2021   Visit for gynecologic examination 06/10/2021   History of ovarian cyst 06/10/2021   Peri-menopause 06/10/2021   Cyst of left ovary 07/11/2020   Fibroids  07/11/2020   Encounter for IUD removal 07/11/2020   Encounter for IUD insertion 06/04/2020   Migraines, neuralgic 12/14/2018   Vitamin D deficiency, unspecified 12/14/2018   Fatigue 12/14/2018   Perennial and seasonal allergic rhinitis 01/29/2016   Seasonal allergic conjunctivitis 01/29/2016   Moderate persistent asthma with mild exacerbation 01/29/2016   TMJ arthralgia 12/27/2012   Varicose veins of lower extremity with inflammation 06/16/2012   Chronic hepatitis C ([place]) 06/06/2011    PCP: Neale Burly  REFERRING PROVIDER: Liz Malady, PA-C; Dr. Robby Sermon  REFERRING DIAG: S/P LEFT MEDIAL PARTIAL MENISCETOMY  THERAPY DIAG:  Chronic pain of left knee  Difficulty in walking, not elsewhere classified  Complex tear of medial meniscus of left knee as current injury, subsequent encounter  Muscle weakness (generalized)  Rationale for Evaluation and Treatment: Rehabilitation  ONSET DATE: 2022; surgery 10/28/2022  SUBJECTIVE:   SUBJECTIVE STATEMENT:  Pt comes today without any complaints or issues.  No pain.  Evaluation: Left knee started hurting back in 2022; saw Dr. Aline Brochure; x-ray; referred to therapy; no improvement so then PCP referred to [facility].  MRI; set up for surgery.  10/28/2022; same day surgery.  No weight on it for 2 weeks then referred to outpatient therapy  PERTINENT HISTORY: no PAIN:  Are you having pain?  Yes: NPRS scale: 0/10 Pain location: left knee Pain description: sore and tight Aggravating factors: standing, walking Relieving factors: rest; medication  PRECAUTIONS: None  WEIGHT BEARING RESTRICTIONS: No  FALLS:  Has patient fallen in last 6 months? Yes. Number of falls 1  right after surgery had a fall  LIVING ENVIRONMENT: Lives with: lives with their family Lives in: House/apartment Stairs: Yes: External: 4 steps; on left going up Has following equipment at home: Crutches  OCCUPATION: works at [employer] not sure if will return  PLOF:  Independent  PATIENT GOALS: to get my knee to feel better  NEXT MD VISIT: 12/09/2022  OBJECTIVE:   DIAGNOSTIC FINDINGS: x-ray per [facility] 2022; had MRI but cannot see  PATIENT SURVEYS:  FOTO 37  COGNITION: Overall cognitive status: Within functional limits for tasks assessed     SENSATION: WFL  EDEMA:  Mild left knee    PALPATION: General soreness left knee  LOWER EXTREMITY ROM:  Active ROM Right eval Left eval  Hip flexion    Hip extension    Hip abduction    Hip adduction    Hip internal rotation    Hip external rotation    Knee flexion 134 86  Knee extension 0 -4  Ankle dorsiflexion    Ankle plantarflexion    Ankle inversion    Ankle eversion     (Blank rows = not tested)  LOWER EXTREMITY MMT:  MMT Right eval Left eval  Hip flexion 5 4+  Hip extension    Hip abduction    Hip adduction    Hip internal rotation    Hip external rotation    Knee flexion    Knee extension 5 3-  Ankle dorsiflexion 5 4+  Ankle plantarflexion    Ankle inversion    Ankle eversion     (Blank rows = not tested)    FUNCTIONAL TESTS:  5 times sit to stand: 21.71 sec using hands on thighs to push up  GAIT: Distance walked: 40 ft Assistive device utilized: None Level of assistance: Modified independence Comments: antalgic gait decreased stance left leg; decreased heel strike and toe off   TODAY'S TREATMENT:                                                                                                                              DATE:   11/21/22 Bike seat9  5 minutes full revolutions Standing:  Lt knee flexion 10X10" onto 12" step  Hamstring stretch 12" step 3X30"  Heelraises 15X  Hip abduction 15X  Hip extension 15X  Lt 4" forward step up 10X  Lt 4" lateral step up 10X  Lunges Lt onto 4" step no UE 10X  4" stair negotiation 5RT with [place]  11/17/22 Bike seat9  5 minutes full revolutions Standing:  Lt knee flexion 10X10" onto 12" step  Heelraises  10X  Knee flexion 10X Seated:  heelslides 10X  Long arc quad 10X5"  Sit  to stands 10X no UE Supine:  heelslides  Quad sets 10X5" AROM: 0-120  11/13/22 physical therapy evaluation and HEP instruction    PATIENT EDUCATION:  Education details: Patient educated on exam findings, POC, scope of PT, HEP. Person educated: Patient Education method: Explanation, Demonstration, and Handouts Education comprehension: verbalized understanding, returned demonstration, verbal cues required, and tactile cues required   HOME EXERCISE PROGRAM: Access Code: [email or web address] URL: [email or web address] Date: 11/13/2022 Prepared by: [facility]  Exercises - Seated Heel Slide  - 2 x daily - 7 x weekly - 1 sets - 10 reps - Seated Long Arc Quad  - 2 x daily - 7 x weekly - 1 sets - 10 reps - Supine Heel Slide  - 2 x daily - 7 x weekly - 1 sets - 10 reps - Supine Quad Set  - 2 x daily - 7 x weekly - 1 sets - 10 reps  ASSESSMENT:  CLINICAL IMPRESSION: Continued with focus on improving LE strength, stability as ROM is now [facility].  Progressed functional strengthening today with abilty to complete with minimal cues.  Pt was utilizing hip mm to complete step ups originally but able to correct with verbal cues and demonstration.  Attempted 6" stair negotiation, however unable to control descent and "hopping" up with Lt LE.  Decreased to 4" with good form and ability to complete without UE assist.  Pt would like to transition to [facility] when ready.   Pt will continue to benefit from skilled physical therapy services to address deficits, reduce pain and improve level of function with ADLs and functional mobility tasks.   OBJECTIVE IMPAIRMENTS: Abnormal gait, decreased activity tolerance, decreased endurance, decreased mobility, difficulty walking, decreased ROM, decreased strength, hypomobility, increased edema, increased fascial restrictions, impaired perceived functional ability, impaired flexibility, and pain.    ACTIVITY LIMITATIONS: carrying, lifting, bending, standing, squatting, stairs, transfers, locomotion level, and caring for others  PARTICIPATION LIMITATIONS: meal prep, cleaning, shopping, community activity, and yard work  [employer] POTENTIAL: Good  CLINICAL DECISION MAKING: Stable/uncomplicated  EVALUATION COMPLEXITY: Low   GOALS: Goals reviewed with patient? Yes  SHORT TERM GOALS: Target date: 11/27/2022 patient will be independent with initial HEP  Baseline: Goal status: IN PROGRESS  2.  Patient will increase left knee mobility to -2 to 110 degrees to improve ability to navigate uneven surfaces safely.  Baseline: see above Goal status: IN PROGRESS   LONG TERM GOALS: Target date: 12/11/2022  Patient will be independent in self management strategies to improve quality of life and functional outcomes.   Baseline:  Goal status: IN PROGRESS  2.  Patient will improve FOTO score to predicted value Baseline: 37 Goal status: IN PROGRESS  3.  Patient will increase  leg MMTs to 5/5 without pain to promote return to ambulation community distances with minimal deviation.  Baseline: see above Goal status: IN PROGRESS  4.  Patient will increased knee mobility to 0 to 125 to promote normal navigation of steps; step over step pattern   Baseline: see above Goal status: IN PROGRESS  5.  Patient will improve 5 times sit to stand score from 21.71 sec to 15 sec to demonstrate improved functional mobility and increased lower extremity strength.  Baseline:  Goal status: IN PROGRESS   PLAN:  PT FREQUENCY: 2x/week  PT DURATION: 4 weeks  PLANNED INTERVENTIONS: Therapeutic exercises, Therapeutic activity, Neuromuscular re-education, Balance training, Gait training, Patient/Family education, Joint manipulation, Joint mobilization, Stair training, Orthotic/Fit training, DME instructions, Aquatic Therapy, Dry Needling, Electrical stimulation,  Spinal manipulation, Spinal mobilization,  Cryotherapy, Moist heat, Compression bandaging, scar mobilization, Splintting, Taping, Traction, Ultrasound, Ionotophoresis '4mg'$ /ml Dexamethasone, and Manual therapy   PLAN FOR NEXT SESSION: Progress functional strength and stability.   10:21 AM, 11/21/22 Teena Irani, PTA/CLT [place] Ph: [phone] .

## 2022-11-25 ENCOUNTER — Encounter (HOSPITAL_COMMUNITY): Payer: Self-pay | Admitting: Physical Therapy

## 2022-11-27 ENCOUNTER — Ambulatory Visit (HOSPITAL_COMMUNITY): Payer: Medicaid Other | Admitting: Physical Therapy

## 2022-11-27 DIAGNOSIS — G8929 Other chronic pain: Secondary | ICD-10-CM | POA: Diagnosis not present

## 2022-11-27 DIAGNOSIS — M6281 Muscle weakness (generalized): Secondary | ICD-10-CM | POA: Diagnosis not present

## 2022-11-27 DIAGNOSIS — R262 Difficulty in walking, not elsewhere classified: Secondary | ICD-10-CM | POA: Diagnosis not present

## 2022-11-27 DIAGNOSIS — S83232D Complex tear of medial meniscus, current injury, left knee, subsequent encounter: Secondary | ICD-10-CM | POA: Diagnosis not present

## 2022-11-27 DIAGNOSIS — M25562 Pain in left knee: Secondary | ICD-10-CM | POA: Diagnosis not present

## 2022-11-27 NOTE — Therapy (Signed)
OUTPATIENT PHYSICAL THERAPY TREATMENT   Patient Name: Amber Russell MRN: [id number] DOB:Jan 01, 1971, 52 y.o., female Today's Date: 11/27/2022  END OF SESSION:  PT End of Session - 11/27/22 1034     Visit Number 4    Number of Visits 8    Date for PT Re-Evaluation 12/11/22    Authorization Type Managed Medicaid; [place] Time Period 8 visits 2/29-4/29    Authorization - Visit Number 3    Authorization - Number of Visits 8    Progress Note Due on Visit 8    PT Start Time 1032    PT Stop Time 1112    PT Time Calculation (min) 40 min    Activity Tolerance Patient tolerated treatment well    Behavior During Therapy [facility] for tasks assessed/performed             Past Medical History:  Diagnosis Date   Contraceptive management 02/15/2015   GERD without esophagitis    Hep C w/o coma, chronic (HCC)    Hx of migraines    Nexplanon removal 02/15/2015   Past Surgical History:  Procedure Laterality Date   BILE DUCT EXPLORATION N/A    CESAREAN SECTION     CESAREAN SECTION     age 66   CHOLECYSTECTOMY     COLONOSCOPY WITH PROPOFOL N/A 07/03/2021   Procedure: COLONOSCOPY WITH PROPOFOL;  Surgeon: Harvel Quale, MD;  [facility];  Service: Gastroenterology;  Laterality: N/A;  10:05 AM   KNEE ARTHROPLASTY     POLYPECTOMY  07/03/2021   Procedure: POLYPECTOMY INTESTINAL;  Surgeon: Harvel Quale, MD;  Location: [facility];  Service: Gastroenterology;;   Patient Active Problem List   Diagnosis Date Noted   RLQ abdominal pain 01/13/2022   Missed period 09/20/2021   Fatty liver 09/20/2021   RUQ pain 09/05/2021   Cysts of both ovaries 09/05/2021   Hot flashes 06/10/2021   Pelvic pain in female 06/10/2021   Encounter for screening fecal occult blood testing 06/10/2021   Visit for gynecologic examination 06/10/2021   History of ovarian cyst 06/10/2021   Peri-menopause 06/10/2021   Cyst of left ovary 07/11/2020   Fibroids  07/11/2020   Encounter for IUD removal 07/11/2020   Encounter for IUD insertion 06/04/2020   Migraines, neuralgic 12/14/2018   Vitamin D deficiency, unspecified 12/14/2018   Fatigue 12/14/2018   Perennial and seasonal allergic rhinitis 01/29/2016   Seasonal allergic conjunctivitis 01/29/2016   Moderate persistent asthma with mild exacerbation 01/29/2016   TMJ arthralgia 12/27/2012   Varicose veins of lower extremity with inflammation 06/16/2012   Chronic hepatitis C ([place]) 06/06/2011    PCP: Neale Burly  REFERRING PROVIDER: Liz Malady, PA-C; Dr. Robby Sermon  REFERRING DIAG: S/P LEFT MEDIAL PARTIAL MENISCETOMY  THERAPY DIAG:  Chronic pain of left knee  Difficulty in walking, not elsewhere classified  Rationale for Evaluation and Treatment: Rehabilitation  ONSET DATE: 2022; surgery 10/28/2022  SUBJECTIVE:   SUBJECTIVE STATEMENT:  Doing well. Compliant with HEP without issue. No pain today.   Evaluation: Left knee started hurting back in 2022; saw Dr. Aline Brochure; x-ray; referred to therapy; no improvement so then PCP referred to [facility].  MRI; set up for surgery.  10/28/2022; same day surgery.  No weight on it for 2 weeks then referred to outpatient therapy  PERTINENT HISTORY: no PAIN:  Are you having pain? Yes: NPRS scale: 0/10 Pain location: left knee Pain description: sore and tight Aggravating factors: standing, walking Relieving  factors: rest; medication  PRECAUTIONS: None  WEIGHT BEARING RESTRICTIONS: No  FALLS:  Has patient fallen in last 6 months? Yes. Number of falls 1  right after surgery had a fall  LIVING ENVIRONMENT: Lives with: lives with their family Lives in: House/apartment Stairs: Yes: External: 4 steps; on left going up Has following equipment at home: Crutches  OCCUPATION: works at [employer] not sure if will return  PLOF: Independent  PATIENT GOALS: to get my knee to feel better  NEXT MD VISIT: 12/09/2022  OBJECTIVE:   DIAGNOSTIC  FINDINGS: x-ray per [facility] 2022; had MRI but cannot see  PATIENT SURVEYS:  FOTO 37  COGNITION: Overall cognitive status: Within functional limits for tasks assessed     SENSATION: WFL  EDEMA:  Mild left knee    PALPATION: General soreness left knee  LOWER EXTREMITY ROM:  Active ROM Right eval Left eval Left  11/27/22  Hip flexion     Hip extension     Hip abduction     Hip adduction     Hip internal rotation     Hip external rotation     Knee flexion 134 86 125  Knee extension 0 -4 0  Ankle dorsiflexion     Ankle plantarflexion     Ankle inversion     Ankle eversion      (Blank rows = not tested)  LOWER EXTREMITY MMT:  MMT Right eval Left eval  Hip flexion 5 4+  Hip extension    Hip abduction    Hip adduction    Hip internal rotation    Hip external rotation    Knee flexion    Knee extension 5 3-  Ankle dorsiflexion 5 4+  Ankle plantarflexion    Ankle inversion    Ankle eversion     (Blank rows = not tested)    FUNCTIONAL TESTS:  5 times sit to stand: 21.71 sec using hands on thighs to push up  GAIT: Distance walked: 40 ft Assistive device utilized: None Level of assistance: Modified independence Comments: antalgic gait decreased stance left leg; decreased heel strike and toe off   TODAY'S TREATMENT:                                                                                                                              DATE:  11/27/22 Nustep lv 4 4 min (seat 7)   Standing:    Heelraises x20  Hip abduction RTB 2 x 15  Hip extension RTB 2 x 15  Lt 4" forward step up x20  Lt 4" step down x20   Minisquat 3 x 10 (elevated seat with foam mat) TKE 3 plates x30  Lunges Lt onto 4" step no UE x 20 each    4" stair negotiation 5RT with no HR  11/21/22 Bike seat9  5 minutes full revolutions Standing:  Lt knee flexion 10X10" onto 12" step  Hamstring stretch 12" step 3X30"  Heelraises  15X  Hip abduction 15X  Hip extension 15X  Lt 4"  forward step up 10X  Lt 4" lateral step up 10X  Lunges Lt onto 4" step no UE 10X  4" stair negotiation 5RT with [facility]  11/17/22 Bike seat9  5 minutes full revolutions Standing:  Lt knee flexion 10X10" onto 12" step  Heelraises 10X  Knee flexion 10X Seated:  heelslides 10X  Long arc quad 10X5"  Sit to stands 10X no UE Supine:  heelslides  Quad sets 10X5" AROM: 0-120  11/13/22 physical therapy evaluation and HEP instruction    PATIENT EDUCATION:  Education details: Patient educated on exam findings, POC, scope of PT, HEP. Person educated: Patient Education method: Explanation, Demonstration, and Handouts Education comprehension: verbalized understanding, returned demonstration, verbal cues required, and tactile cues required   HOME EXERCISE PROGRAM: Access Code: [email or web address] URL: [email or web address]  11/27/22 - Heel Raises with Counter Support  - 2 x daily - 7 x weekly - 3 sets - 10 reps - Hip Abduction with Resistance Loop  - 2 x daily - 7 x weekly - 3 sets - 10 reps - Hip Extension with Resistance Loop  - 2 x daily - 7 x weekly - 3 sets - 10 reps  Date: 11/13/2022 Prepared by: [facility] - Rehab  Exercises - Seated Heel Slide  - 2 x daily - 7 x weekly - 1 sets - 10 reps - Seated Long Arc Quad  - 2 x daily - 7 x weekly - 1 sets - 10 reps - Supine Heel Slide  - 2 x daily - 7 x weekly - 1 sets - 10 reps - Supine Quad Set  - 2 x daily - 7 x weekly - 1 sets - 10 reps  ASSESSMENT:  CLINICAL IMPRESSION: Continued with established POC for LE strengthening for improved functional mobility. Patient showing good motion and currently with ROM LTG met. Patient challenged with mini squats and required frequent cueing and demo for proper form. Added elevated seat mat to improve glute activation and reduce knee stress. Added to HEP and issued updated handout. Patient will continue to benefit from skilled therapy services to reduce remaining deficits and improve functional ability.    OBJECTIVE IMPAIRMENTS: Abnormal gait, decreased activity tolerance, decreased endurance, decreased mobility, difficulty walking, decreased ROM, decreased strength, hypomobility, increased edema, increased fascial restrictions, impaired perceived functional ability, impaired flexibility, and pain.   ACTIVITY LIMITATIONS: carrying, lifting, bending, standing, squatting, stairs, transfers, locomotion level, and caring for others  PARTICIPATION LIMITATIONS: meal prep, cleaning, shopping, community activity, and yard work  [employer] POTENTIAL: Good  CLINICAL DECISION MAKING: Stable/uncomplicated  EVALUATION COMPLEXITY: Low   GOALS: Goals reviewed with patient? Yes  SHORT TERM GOALS: Target date: 11/27/2022 patient will be independent with initial HEP  Baseline: Goal status: IN PROGRESS  2.  Patient will increase left knee mobility to -2 to 110 degrees to improve ability to navigate uneven surfaces safely.  Baseline: see above Goal status: IN PROGRESS   LONG TERM GOALS: Target date: 12/11/2022  Patient will be independent in self management strategies to improve quality of life and functional outcomes. Baseline:  Goal status: IN PROGRESS  2.  Patient will improve FOTO score to predicted value Baseline: 37 Goal status: IN PROGRESS  3.  Patient will increase  leg MMTs to 5/5 without pain to promote return to ambulation community distances with minimal deviation. Baseline: see above Goal status: IN PROGRESS  4.  Patient will increased knee mobility to 0 to 125  to promote normal navigation of steps; step over step pattern Baseline: see above Goal status: MET  5.  Patient will improve 5 times sit to stand score from 21.71 sec to 15 sec to demonstrate improved functional mobility and increased lower extremity strength.  Baseline:  Goal status: IN PROGRESS   PLAN:  PT FREQUENCY: 2x/week  PT DURATION: 4 weeks  PLANNED INTERVENTIONS: Therapeutic exercises, Therapeutic  activity, Neuromuscular re-education, Balance training, Gait training, Patient/Family education, Joint manipulation, Joint mobilization, Stair training, Orthotic/Fit training, DME instructions, Aquatic Therapy, Dry Needling, Electrical stimulation, Spinal manipulation, Spinal mobilization, Cryotherapy, Moist heat, Compression bandaging, scar mobilization, Splintting, Taping, Traction, Ultrasound, Ionotophoresis '4mg'$ /ml Dexamethasone, and Manual therapy   PLAN FOR NEXT SESSION: Progress functional strength and stability.  11:11 AM, 11/27/22 Josue Hector PT DPT  Physical Therapist with [facility]  [phone]

## 2022-12-02 ENCOUNTER — Ambulatory Visit (HOSPITAL_COMMUNITY): Payer: Medicaid Other | Admitting: Physical Therapy

## 2022-12-02 DIAGNOSIS — G8929 Other chronic pain: Secondary | ICD-10-CM

## 2022-12-02 DIAGNOSIS — R262 Difficulty in walking, not elsewhere classified: Secondary | ICD-10-CM | POA: Diagnosis not present

## 2022-12-02 DIAGNOSIS — M25562 Pain in left knee: Secondary | ICD-10-CM | POA: Diagnosis not present

## 2022-12-02 DIAGNOSIS — S83232D Complex tear of medial meniscus, current injury, left knee, subsequent encounter: Secondary | ICD-10-CM

## 2022-12-02 DIAGNOSIS — M6281 Muscle weakness (generalized): Secondary | ICD-10-CM | POA: Diagnosis not present

## 2022-12-02 NOTE — Therapy (Signed)
OUTPATIENT PHYSICAL THERAPY TREATMENT   Patient Name: Amber Russell MRN: [id number] DOB:08/31/1971, 52 y.o., female Today's Date: 12/02/2022  END OF SESSION:  PT End of Session - 12/02/22 1118     Visit Number 5    Number of Visits 8    Date for PT Re-Evaluation 12/11/22    Authorization Type Managed Medicaid; [place] Time Period 8 visits 2/29-4/29    Authorization - Visit Number 4    Authorization - Number of Visits 8    Progress Note Due on Visit 8    PT Start Time [id number]    Activity Tolerance Patient tolerated treatment well    Behavior During Therapy [facility] for tasks assessed/performed              Past Medical History:  Diagnosis Date   Contraceptive management 02/15/2015   GERD without esophagitis    Hep C w/o coma, chronic (HCC)    Hx of migraines    Nexplanon removal 02/15/2015   Past Surgical History:  Procedure Laterality Date   BILE DUCT EXPLORATION N/A    CESAREAN SECTION     CESAREAN SECTION     age 69   CHOLECYSTECTOMY     COLONOSCOPY WITH PROPOFOL N/A 07/03/2021   Procedure: COLONOSCOPY WITH PROPOFOL;  Surgeon: Harvel Quale, MD;  [facility];  Service: Gastroenterology;  Laterality: N/A;  10:05 AM   KNEE ARTHROPLASTY     POLYPECTOMY  07/03/2021   Procedure: POLYPECTOMY INTESTINAL;  Surgeon: Harvel Quale, MD;  [facility];  Service: Gastroenterology;;   Patient Active Problem List   Diagnosis Date Noted   RLQ abdominal pain 01/13/2022   Missed period 09/20/2021   Fatty liver 09/20/2021   RUQ pain 09/05/2021   Cysts of both ovaries 09/05/2021   Hot flashes 06/10/2021   Pelvic pain in female 06/10/2021   Encounter for screening fecal occult blood testing 06/10/2021   Visit for gynecologic examination 06/10/2021   History of ovarian cyst 06/10/2021   Peri-menopause 06/10/2021   Cyst of left ovary 07/11/2020   Fibroids 07/11/2020   Encounter for IUD removal 07/11/2020    Encounter for IUD insertion 06/04/2020   Migraines, neuralgic 12/14/2018   Vitamin D deficiency, unspecified 12/14/2018   Fatigue 12/14/2018   Perennial and seasonal allergic rhinitis 01/29/2016   Seasonal allergic conjunctivitis 01/29/2016   Moderate persistent asthma with mild exacerbation 01/29/2016   TMJ arthralgia 12/27/2012   Varicose veins of lower extremity with inflammation 06/16/2012   Chronic hepatitis C ([place]) 06/06/2011    PCP: Neale Burly  REFERRING PROVIDER: Liz Malady, PA-C; Dr. Robby Sermon  REFERRING DIAG: S/P LEFT MEDIAL PARTIAL MENISCETOMY  THERAPY DIAG:  Chronic pain of left knee  Difficulty in walking, not elsewhere classified  Complex tear of medial meniscus of left knee as current injury, subsequent encounter  Muscle weakness (generalized)  Rationale for Evaluation and Treatment: Rehabilitation  ONSET DATE: 2022; surgery 10/28/2022  SUBJECTIVE:   SUBJECTIVE STATEMENT:  Doing well. No pain or issues, just some stiffness at night. No pain with ambulation or activities.   Evaluation: Left knee started hurting back in 2022; saw Dr. Aline Brochure; x-ray; referred to therapy; no improvement so then PCP referred to [facility].  MRI; set up for surgery.  10/28/2022; same day surgery.  No weight on it for 2 weeks then referred to outpatient therapy  PERTINENT HISTORY: no PAIN:  Are you having pain? Yes: NPRS scale: 0/10 Pain location: left knee  Pain description: sore and tight Aggravating factors: standing, walking Relieving factors: rest; medication  PRECAUTIONS: None  WEIGHT BEARING RESTRICTIONS: No  FALLS:  Has patient fallen in last 6 months? Yes. Number of falls 1  right after surgery had a fall  LIVING ENVIRONMENT: Lives with: lives with their family Lives in: House/apartment Stairs: Yes: External: 4 steps; on left going up Has following equipment at home: Crutches  OCCUPATION: works at [employer] not sure if will return  PLOF:  Independent  PATIENT GOALS: to get my knee to feel better  NEXT MD VISIT: 12/09/2022  OBJECTIVE:   DIAGNOSTIC FINDINGS: x-ray per [facility] 2022; had MRI but cannot see  PATIENT SURVEYS:  FOTO 37  COGNITION: Overall cognitive status: Within functional limits for tasks assessed     SENSATION: WFL  EDEMA:  Mild left knee    PALPATION: General soreness left knee  LOWER EXTREMITY ROM:  Active ROM Right eval Left eval Left  11/27/22  Hip flexion     Hip extension     Hip abduction     Hip adduction     Hip internal rotation     Hip external rotation     Knee flexion 134 86 125  Knee extension 0 -4 0  Ankle dorsiflexion     Ankle plantarflexion     Ankle inversion     Ankle eversion      (Blank rows = not tested)  LOWER EXTREMITY MMT:  MMT Right eval Left eval  Hip flexion 5 4+  Hip extension    Hip abduction    Hip adduction    Hip internal rotation    Hip external rotation    Knee flexion    Knee extension 5 3-  Ankle dorsiflexion 5 4+  Ankle plantarflexion    Ankle inversion    Ankle eversion     (Blank rows = not tested)    FUNCTIONAL TESTS:  5 times sit to stand: 21.71 sec using hands on thighs to push up  GAIT: Distance walked: 40 ft Assistive device utilized: None Level of assistance: Modified independence Comments: antalgic gait decreased stance left leg; decreased heel strike and toe off   TODAY'S TREATMENT:                                                                                                                              DATE:  12/02/22 Bike seat 9  5 minutes full revolutions Standing:  Lt knee flexion 10X10" onto 12" step  Hamstring stretch 12" step 3X30"  Heelraises 15X no UE assist  Hip abduction 15X2 RTB  Hip extension 15X2 RTB  Lt 6" forward step up 10X2  Lt 6" lateral step up 10X2  Lunges Lt onto 4" step no UE 10X2  Tandem stance 30" each no challenge  SLS 30" each, added foam with max of 18" each without UE  assist  11/27/22 Nustep lv 4 4 min (seat 7)  Standing:    Heelraises x20  Hip abduction RTB 2 x 15  Hip extension RTB 2 x 15  Lt 4" forward step up x20  Lt 4" step down x20   Minisquat 3 x 10 (elevated seat with foam mat) TKE 3 plates x30  Lunges Lt onto 4" step no UE x 20 each    4" stair negotiation 5RT with no HR  11/21/22 Bike seat9  5 minutes full revolutions Standing:  Lt knee flexion 10X10" onto 12" step  Hamstring stretch 12" step 3X30"  Heelraises 15X  Hip abduction 15X  Hip extension 15X  Lt 4" forward step up 10X  Lt 4" lateral step up 10X  Lunges Lt onto 4" step no UE 10X  4" stair negotiation 5RT with [facility]  11/17/22 Bike seat 9 5 minutes full revolutions Standing:  Lt knee flexion 10X10" onto 12" step  Heelraises 10X  Knee flexion 10X Seated:  heelslides 10X  Long arc quad 10X5"  Sit to stands 10X no UE Supine:  heelslides  Quad sets 10X5" AROM: 0-120  11/13/22 physical therapy evaluation and HEP instruction    PATIENT EDUCATION:  Education details: Patient educated on exam findings, POC, scope of PT, HEP. Person educated: Patient Education method: Explanation, Demonstration, and Handouts Education comprehension: verbalized understanding, returned demonstration, verbal cues required, and tactile cues required   HOME EXERCISE PROGRAM: Access Code: [email or web address] URL: [email or web address]  11/27/22 - Heel Raises with Counter Support  - 2 x daily - 7 x weekly - 3 sets - 10 reps - Hip Abduction with Resistance Loop  - 2 x daily - 7 x weekly - 3 sets - 10 reps - Hip Extension with Resistance Loop  - 2 x daily - 7 x weekly - 3 sets - 10 reps  Date: 11/13/2022 Prepared by: [facility] - Rehab  Exercises - Seated Heel Slide  - 2 x daily - 7 x weekly - 1 sets - 10 reps - Seated Long Arc Quad  - 2 x daily - 7 x weekly - 1 sets - 10 reps - Supine Heel Slide  - 2 x daily - 7 x weekly - 1 sets - 10 reps - Supine Quad Set  - 2 x daily - 7 x weekly - 1 sets -  10 reps  ASSESSMENT:  CLINICAL IMPRESSION: Focus remains on improving LE strength and functional mobility. Patient has made good improvements and no difficulty completing any of the activities performed.  Added tandem stance without challenge and even SLS was not that challenging, however changing to foam surface increased difficulty with max of 18-20" before needing UE assist.  Incrased to 6" height of steps without challenge, pain or issues as well.  Cues needed to slow down with some therex as tends to rush through them.  Patient will continue to benefit from skilled therapy services to reduce remaining deficits and improve functional ability.   OBJECTIVE IMPAIRMENTS: Abnormal gait, decreased activity tolerance, decreased endurance, decreased mobility, difficulty walking, decreased ROM, decreased strength, hypomobility, increased edema, increased fascial restrictions, impaired perceived functional ability, impaired flexibility, and pain.   ACTIVITY LIMITATIONS: carrying, lifting, bending, standing, squatting, stairs, transfers, locomotion level, and caring for others  PARTICIPATION LIMITATIONS: meal prep, cleaning, shopping, community activity, and yard work  [employer] POTENTIAL: Good  CLINICAL DECISION MAKING: Stable/uncomplicated  EVALUATION COMPLEXITY: Low   GOALS: Goals reviewed with patient? Yes  SHORT TERM GOALS: Target date: 11/27/2022 patient will be independent with initial HEP  Baseline: Goal status: IN  PROGRESS  2.  Patient will increase left knee mobility to -2 to 110 degrees to improve ability to navigate uneven surfaces safely.  Baseline: see above Goal status: IN PROGRESS   LONG TERM GOALS: Target date: 12/11/2022  Patient will be independent in self management strategies to improve quality of life and functional outcomes. Baseline:  Goal status: IN PROGRESS  2.  Patient will improve FOTO score to predicted value Baseline: 37 Goal status: IN PROGRESS  3.   Patient will increase  leg MMTs to 5/5 without pain to promote return to ambulation community distances with minimal deviation. Baseline: see above Goal status: IN PROGRESS  4.  Patient will increased knee mobility to 0 to 125 to promote normal navigation of steps; step over step pattern Baseline: see above Goal status: MET  5.  Patient will improve 5 times sit to stand score from 21.71 sec to 15 sec to demonstrate improved functional mobility and increased lower extremity strength.  Baseline:  Goal status: IN PROGRESS   PLAN:  PT FREQUENCY: 2x/week  PT DURATION: 4 weeks  PLANNED INTERVENTIONS: Therapeutic exercises, Therapeutic activity, Neuromuscular re-education, Balance training, Gait training, Patient/Family education, Joint manipulation, Joint mobilization, Stair training, Orthotic/Fit training, DME instructions, Aquatic Therapy, Dry Needling, Electrical stimulation, Spinal manipulation, Spinal mobilization, Cryotherapy, Moist heat, Compression bandaging, scar mobilization, Splintting, Taping, Traction, Ultrasound, Ionotophoresis 4mg /ml Dexamethasone, and Manual therapy   PLAN FOR NEXT SESSION: Progress functional strength and stability.  11:24 AM, 12/02/22 Teena Irani, PTA/CLT [place] Ph: [phone]

## 2022-12-03 DIAGNOSIS — M1710 Unilateral primary osteoarthritis, unspecified knee: Secondary | ICD-10-CM | POA: Diagnosis not present

## 2022-12-03 DIAGNOSIS — G43909 Migraine, unspecified, not intractable, without status migrainosus: Secondary | ICD-10-CM | POA: Diagnosis not present

## 2022-12-03 DIAGNOSIS — J301 Allergic rhinitis due to pollen: Secondary | ICD-10-CM | POA: Diagnosis not present

## 2022-12-03 DIAGNOSIS — Z6824 Body mass index (BMI) 24.0-24.9, adult: Secondary | ICD-10-CM | POA: Diagnosis not present

## 2022-12-03 DIAGNOSIS — J208 Acute bronchitis due to other specified organisms: Secondary | ICD-10-CM | POA: Diagnosis not present

## 2022-12-03 DIAGNOSIS — L639 Alopecia areata, unspecified: Secondary | ICD-10-CM | POA: Diagnosis not present

## 2022-12-04 ENCOUNTER — Ambulatory Visit (HOSPITAL_COMMUNITY): Payer: Medicaid Other

## 2022-12-04 DIAGNOSIS — M25562 Pain in left knee: Secondary | ICD-10-CM | POA: Diagnosis not present

## 2022-12-04 DIAGNOSIS — M6281 Muscle weakness (generalized): Secondary | ICD-10-CM

## 2022-12-04 DIAGNOSIS — G8929 Other chronic pain: Secondary | ICD-10-CM | POA: Diagnosis not present

## 2022-12-04 DIAGNOSIS — R262 Difficulty in walking, not elsewhere classified: Secondary | ICD-10-CM

## 2022-12-04 DIAGNOSIS — S83232D Complex tear of medial meniscus, current injury, left knee, subsequent encounter: Secondary | ICD-10-CM

## 2022-12-04 NOTE — Therapy (Signed)
OUTPATIENT PHYSICAL THERAPY TREATMENT/PROGRESS NOTE/DISCHARGE  PHYSICAL THERAPY DISCHARGE SUMMARY  Visits from Start of Care: 6  Current functional level related to goals / functional outcomes: See below   Remaining deficits: See below   Education / Equipment: See below   Patient agrees to discharge. Patient goals were met. Patient is being discharged due to meeting the stated rehab goals.  Progress Note Reporting Period 11/13/2022 to 12/04/2022  See note below for Objective Data and Assessment of Progress/Goals.       Patient Name: Amber Russell MRN: [id number] DOB:[birth year], 52 y.o., female Today's Date: 12/04/2022  END OF SESSION:  PT End of Session - 12/04/22 1041     Visit Number 6    Number of Visits 8    Date for PT Re-Evaluation 12/11/22    Authorization Type Managed Medicaid; [place] Time Period 8 visits 2/29-4/29    Authorization - Visit Number 5    Authorization - Number of Visits 8    Progress Note Due on Visit 8    PT Start Time [id number]    PT Stop Time 1119    PT Time Calculation (min) 39 min    Activity Tolerance Patient tolerated treatment well    Behavior During Therapy [facility] for tasks assessed/performed              Past Medical History:  Diagnosis Date   Contraceptive management 02/15/2015   GERD without esophagitis    Hep C w/o coma, chronic (HCC)    Hx of migraines    Nexplanon removal 02/15/2015   Past Surgical History:  Procedure Laterality Date   BILE DUCT EXPLORATION N/A    CESAREAN SECTION     CESAREAN SECTION     age 34   CHOLECYSTECTOMY     COLONOSCOPY WITH PROPOFOL N/A 07/03/2021   Procedure: COLONOSCOPY WITH PROPOFOL;  Surgeon: Harvel Quale, MD;  [facility]: [facility];  Service: Gastroenterology;  Laterality: N/A;  10:05 AM   KNEE ARTHROPLASTY     POLYPECTOMY  07/03/2021   Procedure: POLYPECTOMY INTESTINAL;  Surgeon: Harvel Quale, MD;  Location: [facility];  Service:  Gastroenterology;;   Patient Active Problem List   Diagnosis Date Noted   RLQ abdominal pain 01/13/2022   Missed period 09/20/2021   Fatty liver 09/20/2021   RUQ pain 09/05/2021   Cysts of both ovaries 09/05/2021   Hot flashes 06/10/2021   Pelvic pain in female 06/10/2021   Encounter for screening fecal occult blood testing 06/10/2021   Visit for gynecologic examination 06/10/2021   History of ovarian cyst 06/10/2021   Peri-menopause 06/10/2021   Cyst of left ovary 07/11/2020   Fibroids 07/11/2020   Encounter for IUD removal 07/11/2020   Encounter for IUD insertion 06/04/2020   Migraines, neuralgic 12/14/2018   Vitamin D deficiency, unspecified 12/14/2018   Fatigue 12/14/2018   Perennial and seasonal allergic rhinitis 01/29/2016   Seasonal allergic conjunctivitis 01/29/2016   Moderate persistent asthma with mild exacerbation 01/29/2016   TMJ arthralgia 12/27/2012   Varicose veins of lower extremity with inflammation 06/16/2012   Chronic hepatitis C ([place]) 06/06/2011    PCP: Neale Burly  REFERRING PROVIDER: Liz Malady, PA-C; Dr. Robby Sermon  REFERRING DIAG: S/P LEFT MEDIAL PARTIAL MENISCETOMY  THERAPY DIAG:  Chronic pain of left knee  Difficulty in walking, not elsewhere classified  Complex tear of medial meniscus of left knee as current injury, subsequent encounter  Muscle weakness (generalized)  Rationale for Evaluation and Treatment:  Rehabilitation  ONSET DATE: 2022; surgery 10/28/2022  SUBJECTIVE:   SUBJECTIVE STATEMENT:  No pain on arrival to therapy; reports sometimes has some pain at night. Sees MD next week; "80%" better   Evaluation: Left knee started hurting back in 2022; saw Dr. Aline Brochure; x-ray; referred to therapy; no improvement so then PCP referred to [facility].  MRI; set up for surgery.  10/28/2022; same day surgery.  No weight on it for 2 weeks then referred to outpatient therapy  PERTINENT HISTORY: no PAIN:  Are you having pain? Yes: NPRS  scale: 0/10 Pain location: left knee Pain description: sore and tight Aggravating factors: standing, walking Relieving factors: rest; medication  PRECAUTIONS: None  WEIGHT BEARING RESTRICTIONS: No  FALLS:  Has patient fallen in last 6 months? Yes. Number of falls 1  right after surgery had a fall  LIVING ENVIRONMENT: Lives with: lives with their family Lives in: House/apartment Stairs: Yes: External: 4 steps; on left going up Has following equipment at home: Crutches  OCCUPATION: works at [employer] not sure if will return  PLOF: Independent  PATIENT GOALS: to get my knee to feel better  NEXT MD VISIT: 12/09/2022  OBJECTIVE:   DIAGNOSTIC FINDINGS: x-ray per [facility]arrison 2022; had MRI but cannot see  PATIENT SURVEYS:  FOTO 37  COGNITION: Overall cognitive status: Within functional limits for tasks assessed     SENSATION: WFL  EDEMA:  Mild left knee    PALPATION: General soreness left knee  LOWER EXTREMITY ROM:  Active ROM Right eval Left eval Left  11/27/22  Hip flexion     Hip extension     Hip abduction     Hip adduction     Hip internal rotation     Hip external rotation     Knee flexion 134 86 125  Knee extension 0 -4 0  Ankle dorsiflexion     Ankle plantarflexion     Ankle inversion     Ankle eversion      (Blank rows = not tested)  LOWER EXTREMITY MMT:  MMT Right eval Left eval Left 12/04/22  Hip flexion 5 4+ 5  Hip extension     Hip abduction     Hip adduction     Hip internal rotation     Hip external rotation     Knee flexion     Knee extension 5 3- 5  Ankle dorsiflexion 5 4+ 5  Ankle plantarflexion     Ankle inversion     Ankle eversion      (Blank rows = not tested)    FUNCTIONAL TESTS:  5 times sit to stand: 21.71 sec using hands on thighs to push up  GAIT: Distance walked: 40 ft Assistive device utilized: None Level of assistance: Modified independence Comments: antalgic gait decreased stance left leg; decreased  heel strike and toe off   TODAY'S TREATMENT:  DATE:  12/04/22 Bike seat 9  5 minutes full revolutions  Standing: Heel/toe raises x 20 Left knee drives for flexion on 12" box x 2' Hamstring stretch 5 x 20" on 12" box 8" box step ups 2 x 10  Progress note 5 x sit to stand 12.25 sec no UE assist FOTO 99 AROM and MMTS see above      12/02/22 Bike seat 9  5 minutes full revolutions Standing:  Lt knee flexion 10X10" onto 12" step  Hamstring stretch 12" step 3X30"  Heelraises 15X no UE assist  Hip abduction 15X2 RTB  Hip extension 15X2 RTB  Lt 6" forward step up 10X2  Lt 6" lateral step up 10X2  Lunges Lt onto 4" step no UE 10X2  Tandem stance 30" each no challenge  SLS 30" each, added foam with max of 18" each without UE assist  11/27/22 Nustep lv 4 4 min (seat 7)  Standing:    Heelraises x20  Hip abduction RTB 2 x 15  Hip extension RTB 2 x 15  Lt 4" forward step up x20  Lt 4" step down x20   Minisquat 3 x 10 (elevated seat with foam mat) TKE 3 plates x30  Lunges Lt onto 4" step no UE x 20 each    4" stair negotiation 5RT with no HR  11/21/22 Bike seat9  5 minutes full revolutions Standing:  Lt knee flexion 10X10" onto 12" step  Hamstring stretch 12" step 3X30"  Heelraises 15X  Hip abduction 15X  Hip extension 15X  Lt 4" forward step up 10X  Lt 4" lateral step up 10X  Lunges Lt onto 4" step no UE 10X  4" stair negotiation 5RT with [facility]  11/17/22 Bike seat 9 5 minutes full revolutions Standing:  Lt knee flexion 10X10" onto 12" step  Heelraises 10X  Knee flexion 10X Seated:  heelslides 10X  Long arc quad 10X5"  Sit to stands 10X no UE Supine:  heelslides  Quad sets 10X5" AROM: 0-120  11/13/22 physical therapy evaluation and HEP instruction    PATIENT EDUCATION:  Education details: Patient educated on exam findings, POC, scope of PT,  HEP. Person educated: Patient Education method: Explanation, Demonstration, and Handouts Education comprehension: verbalized understanding, returned demonstration, verbal cues required, and tactile cues required   HOME EXERCISE PROGRAM: Access Code: [email or web address] URL: [email or web address] Date: 12/04/2022 Prepared by: [facility]  Exercises - Seated Heel Slide  - 2 x daily - 7 x weekly - 1 sets - 10 reps - Seated Long Arc Quad  - 2 x daily - 7 x weekly - 1 sets - 10 reps - Supine Heel Slide  - 2 x daily - 7 x weekly - 1 sets - 10 reps - Supine Quad Set  - 2 x daily - 7 x weekly - 1 sets - 10 reps - Heel Raises with Counter Support  - 2 x daily - 7 x weekly - 3 sets - 10 reps - Hip Abduction with Resistance Loop  - 2 x daily - 7 x weekly - 3 sets - 10 reps - Hip Extension with Resistance Loop  - 2 x daily - 7 x weekly - 3 sets - 10 reps - Supine Active Straight Leg Raise  - 2 x daily - 7 x weekly - 1 sets - 10 reps - Straight Leg Raise with External Rotation  - 2 x daily - 7 x weekly - 1 sets - 10 reps  Access Code: [email or web address]  URL: [email or web address]  11/27/22 - Heel Raises with Counter Support  - 2 x daily - 7 x weekly - 3 sets - 10 reps - Hip Abduction with Resistance Loop  - 2 x daily - 7 x weekly - 3 sets - 10 reps - Hip Extension with Resistance Loop  - 2 x daily - 7 x weekly - 3 sets - 10 reps  Date: 11/13/2022 Prepared by: [facility]  Exercises - Seated Heel Slide  - 2 x daily - 7 x weekly - 1 sets - 10 reps - Seated Long Arc Quad  - 2 x daily - 7 x weekly - 1 sets - 10 reps - Supine Heel Slide  - 2 x daily - 7 x weekly - 1 sets - 10 reps - Supine Quad Set  - 2 x daily - 7 x weekly - 1 sets - 10 reps  ASSESSMENT:  CLINICAL IMPRESSION: Progress note today as patient feels she is "80%" better.  She has met all set rehab goals and is agreeable to discharge at this time.      OBJECTIVE IMPAIRMENTS: Abnormal gait, decreased activity tolerance,  decreased endurance, decreased mobility, difficulty walking, decreased ROM, decreased strength, hypomobility, increased edema, increased fascial restrictions, impaired perceived functional ability, impaired flexibility, and pain.   ACTIVITY LIMITATIONS: carrying, lifting, bending, standing, squatting, stairs, transfers, locomotion level, and caring for others  PARTICIPATION LIMITATIONS: meal prep, cleaning, shopping, community activity, and yard work  [employer] POTENTIAL: Good  CLINICAL DECISION MAKING: Stable/uncomplicated  EVALUATION COMPLEXITY: Low   GOALS: Goals reviewed with patient? Yes  SHORT TERM GOALS: Target date: 11/27/2022 patient will be independent with initial HEP  Baseline: Goal status: MET  2.  Patient will increase left knee mobility to -2 to 110 degrees to improve ability to navigate uneven surfaces safely.  Baseline: see above Goal status: MET   LONG TERM GOALS: Target date: 12/11/2022  Patient will be independent in self management strategies to improve quality of life and functional outcomes. Baseline:  Goal status: MET  2.  Patient will improve FOTO score to predicted value Baseline: 37 Goal status: MET  3.  Patient will increase  leg MMTs to 5/5 without pain to promote return to ambulation community distances with minimal deviation. Baseline: see above Goal status: MET  4.  Patient will increased knee mobility to 0 to 125 to promote normal navigation of steps; step over step pattern Baseline: see above Goal status: MET  5.  Patient will improve 5 times sit to stand score from 21.71 sec to 15 sec to demonstrate improved functional mobility and increased lower extremity strength.  Baseline: 12.25 12/04/22 Goal status: MET   PLAN:  PT FREQUENCY: 2x/week  PT DURATION: 4 weeks  PLANNED INTERVENTIONS: Therapeutic exercises, Therapeutic activity, Neuromuscular re-education, Balance training, Gait training, Patient/Family education, Joint  manipulation, Joint mobilization, Stair training, Orthotic/Fit training, DME instructions, Aquatic Therapy, Dry Needling, Electrical stimulation, Spinal manipulation, Spinal mobilization, Cryotherapy, Moist heat, Compression bandaging, scar mobilization, Splintting, Taping, Traction, Ultrasound, Ionotophoresis 4mg /ml Dexamethasone, and Manual therapy   PLAN FOR NEXT SESSION: discharge; States she sees MD 11/10/22 11:14 AM, 12/04/22 Lerae Langham Small Janene Yousuf MPT [facility] [place] [phone] [id number]

## 2022-12-08 ENCOUNTER — Encounter (HOSPITAL_COMMUNITY): Payer: Self-pay | Admitting: Physical Therapy

## 2022-12-09 ENCOUNTER — Encounter (HOSPITAL_COMMUNITY): Payer: Self-pay

## 2022-12-09 DIAGNOSIS — Z4889 Encounter for other specified surgical aftercare: Secondary | ICD-10-CM | POA: Diagnosis not present

## 2022-12-09 DIAGNOSIS — Z9889 Other specified postprocedural states: Secondary | ICD-10-CM | POA: Diagnosis not present

## 2022-12-11 ENCOUNTER — Encounter (HOSPITAL_COMMUNITY): Payer: Self-pay

## 2022-12-15 DIAGNOSIS — Z419 Encounter for procedure for purposes other than remedying health state, unspecified: Secondary | ICD-10-CM | POA: Diagnosis not present

## 2022-12-16 ENCOUNTER — Encounter (HOSPITAL_COMMUNITY): Payer: Self-pay | Admitting: Physical Therapy

## 2022-12-18 ENCOUNTER — Encounter (HOSPITAL_COMMUNITY): Payer: Self-pay

## 2022-12-22 DIAGNOSIS — Z6824 Body mass index (BMI) 24.0-24.9, adult: Secondary | ICD-10-CM | POA: Diagnosis not present

## 2022-12-22 DIAGNOSIS — H109 Unspecified conjunctivitis: Secondary | ICD-10-CM | POA: Diagnosis not present

## 2022-12-22 DIAGNOSIS — J301 Allergic rhinitis due to pollen: Secondary | ICD-10-CM | POA: Diagnosis not present

## 2022-12-23 ENCOUNTER — Encounter (HOSPITAL_COMMUNITY): Payer: Self-pay

## 2022-12-25 ENCOUNTER — Encounter (HOSPITAL_COMMUNITY): Payer: Self-pay

## 2022-12-30 ENCOUNTER — Encounter (HOSPITAL_COMMUNITY): Payer: Self-pay

## 2022-12-31 DIAGNOSIS — H5213 Myopia, bilateral: Secondary | ICD-10-CM | POA: Diagnosis not present

## 2023-01-01 ENCOUNTER — Encounter (HOSPITAL_COMMUNITY): Payer: Self-pay | Admitting: Physical Therapy

## 2023-01-14 DIAGNOSIS — Z419 Encounter for procedure for purposes other than remedying health state, unspecified: Secondary | ICD-10-CM | POA: Diagnosis not present

## 2023-02-10 DIAGNOSIS — Z4789 Encounter for other orthopedic aftercare: Secondary | ICD-10-CM | POA: Diagnosis not present

## 2023-02-10 DIAGNOSIS — Z9889 Other specified postprocedural states: Secondary | ICD-10-CM | POA: Diagnosis not present

## 2023-02-10 DIAGNOSIS — Z87828 Personal history of other (healed) physical injury and trauma: Secondary | ICD-10-CM | POA: Diagnosis not present

## 2023-02-14 DIAGNOSIS — Z419 Encounter for procedure for purposes other than remedying health state, unspecified: Secondary | ICD-10-CM | POA: Diagnosis not present

## 2023-03-16 DIAGNOSIS — Z419 Encounter for procedure for purposes other than remedying health state, unspecified: Secondary | ICD-10-CM | POA: Diagnosis not present

## 2023-04-16 DIAGNOSIS — Z419 Encounter for procedure for purposes other than remedying health state, unspecified: Secondary | ICD-10-CM | POA: Diagnosis not present

## 2023-05-04 ENCOUNTER — Ambulatory Visit (INDEPENDENT_AMBULATORY_CARE_PROVIDER_SITE_OTHER): Payer: Medicaid Other | Admitting: Adult Health

## 2023-05-04 ENCOUNTER — Other Ambulatory Visit (HOSPITAL_COMMUNITY)
Admission: RE | Admit: 2023-05-04 | Discharge: 2023-05-04 | Disposition: A | Discharge status: Home / Self Care | Payer: Medicaid Other | Source: Ambulatory Visit | Attending: Adult Health | Admitting: Adult Health

## 2023-05-04 ENCOUNTER — Encounter: Payer: Self-pay | Admitting: Adult Health

## 2023-05-04 VITALS — BP 129/79 | HR 56 | Ht 64.0 in | Wt 149.0 lb

## 2023-05-04 DIAGNOSIS — N926 Irregular menstruation, unspecified: Secondary | ICD-10-CM | POA: Diagnosis not present

## 2023-05-04 DIAGNOSIS — R1031 Right lower quadrant pain: Secondary | ICD-10-CM | POA: Diagnosis not present

## 2023-05-04 DIAGNOSIS — N951 Menopausal and female climacteric states: Secondary | ICD-10-CM

## 2023-05-04 DIAGNOSIS — Z124 Encounter for screening for malignant neoplasm of cervix: Secondary | ICD-10-CM | POA: Diagnosis not present

## 2023-05-04 DIAGNOSIS — R232 Flushing: Secondary | ICD-10-CM | POA: Diagnosis not present

## 2023-05-04 DIAGNOSIS — N83202 Unspecified ovarian cyst, left side: Secondary | ICD-10-CM | POA: Diagnosis not present

## 2023-05-04 NOTE — Progress Notes (Signed)
  Subjective:     Patient ID: Amber Russell, female   DOB: 07/09/1971, 52 y.o.   MRN: [id number]  HPI Amber Russell is a 52 year old female, married, [id number], in complaining of pain in RLQ and hot flashes with sweats, and irregular periods, skipping now. Has history of cyst on the left.  And she needs a pap.  PCP is Dr Olena Leatherwood.  Review of Systems +pain RLQ +hot flashes with sweats +irregular periods, skipping now    Reviewed past medical,surgical, social and family history. Reviewed medications and allergies.  Objective:   Physical Exam BP 129/79 (BP Location: Left Arm, Patient Position: Sitting, Cuff Size: Normal)   Pulse (!) 56   Ht 5\' 4"  (1.626 m)   Wt 149 lb (67.6 kg)   LMP 03/09/2023   BMI 25.58 kg/m     Skin warm and dry.Pelvic: external genitalia is normal in appearance no lesions, vagina:pale,urethra has no lesions or masses noted, cervix:smooth, pap with HR HPV genotyping performed, uterus: normal size, shape and contour, mildly tender, no masses felt, adnexa: no masses, + tenderness noted RLQ. Bladder is non tender and no masses felt. Fall risk is low  Upstream - 05/04/23 1019       Pregnancy Intention Screening   Does the patient want to become pregnant in the next year? No    Does the patient's partner want to become pregnant in the next year? No    Would the patient like to discuss contraceptive options today? No      Contraception Wrap Up   Current Method No Method - Other Reason   husband had prostate surgery   End Method No Method - Other Reason   husband had prostate surgery   Contraception Counseling Provided No            Examination chaperoned by Malachy Mood LPN  Assessment:     1. RLQ abdominal pain +RLQ pain  Will get [name] 05/13/23 at 9:30 am at [facility] to assess ovaries and uterus  - US PELVIC COMPLETE WITH TRANSVAGINAL; Future  2. Cyst of left ovary US scheduled for 05/13/23 to assess ovaries and uterus  - US PELVIC COMPLETE WITH TRANSVAGINAL;  Future  3. Peri-menopause Discussed that irregular periods and hot flashes signs of perimenopause   4. Irregular periods Skipping periods   5. Hot flashes +hot flashes, will sweat Has to sleep without covers   6. Routine Papanicolaou smear Pap sent Pap in 3 years if normal  - Cytology - PAP( [place])     Plan:     Follow up in 2 weeks for review of [name] and discuss treatment options for hot flashes

## 2023-05-05 LAB — CYTOLOGY - PAP
Comment: NEGATIVE
Diagnosis: NEGATIVE
High risk HPV: NEGATIVE

## 2023-05-11 ENCOUNTER — Ambulatory Visit (HOSPITAL_COMMUNITY)
Admission: RE | Admit: 2023-05-11 | Discharge: 2023-05-11 | Disposition: A | Discharge status: Home / Self Care | Payer: Medicaid Other | Source: Ambulatory Visit | Attending: Adult Health | Admitting: Adult Health

## 2023-05-11 DIAGNOSIS — R1031 Right lower quadrant pain: Secondary | ICD-10-CM | POA: Insufficient documentation

## 2023-05-11 DIAGNOSIS — N83292 Other ovarian cyst, left side: Secondary | ICD-10-CM | POA: Diagnosis not present

## 2023-05-11 DIAGNOSIS — D259 Leiomyoma of uterus, unspecified: Secondary | ICD-10-CM | POA: Diagnosis not present

## 2023-05-11 DIAGNOSIS — N83202 Unspecified ovarian cyst, left side: Secondary | ICD-10-CM | POA: Insufficient documentation

## 2023-05-17 DIAGNOSIS — Z419 Encounter for procedure for purposes other than remedying health state, unspecified: Secondary | ICD-10-CM | POA: Diagnosis not present

## 2023-05-20 ENCOUNTER — Ambulatory Visit: Payer: Medicaid Other | Admitting: Adult Health

## 2023-05-25 ENCOUNTER — Ambulatory Visit: Payer: Medicaid Other | Admitting: Adult Health

## 2023-05-28 ENCOUNTER — Ambulatory Visit (INDEPENDENT_AMBULATORY_CARE_PROVIDER_SITE_OTHER): Payer: Medicaid Other | Admitting: Adult Health

## 2023-05-28 ENCOUNTER — Encounter: Payer: Self-pay | Admitting: Adult Health

## 2023-05-28 VITALS — BP 118/71 | HR 64 | Ht 64.0 in | Wt 150.0 lb

## 2023-05-28 DIAGNOSIS — N926 Irregular menstruation, unspecified: Secondary | ICD-10-CM | POA: Diagnosis not present

## 2023-05-28 DIAGNOSIS — N83202 Unspecified ovarian cyst, left side: Secondary | ICD-10-CM

## 2023-05-28 DIAGNOSIS — Z1211 Encounter for screening for malignant neoplasm of colon: Secondary | ICD-10-CM

## 2023-05-28 DIAGNOSIS — N951 Menopausal and female climacteric states: Secondary | ICD-10-CM | POA: Diagnosis not present

## 2023-05-28 DIAGNOSIS — R102 Pelvic and perineal pain: Secondary | ICD-10-CM

## 2023-05-28 DIAGNOSIS — D219 Benign neoplasm of connective and other soft tissue, unspecified: Secondary | ICD-10-CM

## 2023-05-28 DIAGNOSIS — Z79890 Hormone replacement therapy (postmenopausal): Secondary | ICD-10-CM | POA: Diagnosis not present

## 2023-05-28 DIAGNOSIS — R232 Flushing: Secondary | ICD-10-CM

## 2023-05-28 DIAGNOSIS — R5383 Other fatigue: Secondary | ICD-10-CM | POA: Diagnosis not present

## 2023-05-28 DIAGNOSIS — Z01419 Encounter for gynecological examination (general) (routine) without abnormal findings: Secondary | ICD-10-CM | POA: Diagnosis not present

## 2023-05-28 DIAGNOSIS — L290 Pruritus ani: Secondary | ICD-10-CM

## 2023-05-28 LAB — HEMOCCULT GUIAC POC 1CARD (OFFICE): Fecal Occult Blood, POC: NEGATIVE

## 2023-05-28 MED ORDER — HYDROCORTISONE (PERIANAL) 2.5 % EX CREA: 1.0000 | TOPICAL_CREAM | Freq: Two times a day (BID) | CUTANEOUS | 0 refills | Status: DC

## 2023-05-28 MED ORDER — DUAVEE 0.45-20 MG PO TABS: 1.0000 | ORAL_TABLET | Freq: Every day | ORAL | Status: DC

## 2023-05-28 NOTE — Progress Notes (Signed)
Patient ID: Amber Russell, female   DOB: 1971-02-26, 52 y.o.   MRN: [id number] History of Present Illness: Amber Russell is a 52 year old female, married, [id number], in for well woman gyn exam. She is still having some pelvic pain on and off. Her periods are irregular, last one in June and having hot flashes and night sweats, is tired and moody. Has rectal itching at times.      Component Value Date/Time   DIAGPAP  05/04/2023 1039    - Negative for intraepithelial lesion or malignancy (NILM)   DIAGPAP  05/30/2019 0000    NEGATIVE FOR INTRAEPITHELIAL LESIONS OR MALIGNANCY.   HPVHIGH Negative 05/04/2023 1039   ADEQPAP  05/04/2023 1039    Satisfactory for evaluation; transformation zone component PRESENT.   ADEQPAP  05/30/2019 0000    Satisfactory for evaluation  endocervical/transformation zone component PRESENT.    PCP is Dr Olena Leatherwood    Current Medications, Allergies, Past Medical History, Past Surgical History, Family History and Social History were reviewed in [employer] electronic medical record.     Review of Systems: Patient denies any headaches, hearing loss,  blurred vision, shortness of breath, chest pain,  problems with bowel movements, urination, or intercourse(not active). No joint pain or mood swings.  See HPI for positives    Physical Exam:BP 118/71 (BP Location: Left Arm, Patient Position: Sitting, Cuff Size: Normal)   Pulse 64   Ht 5\' 4"  (1.626 m)   Wt 150 lb (68 kg)   LMP 03/09/2023   BMI 25.75 kg/m   General:  Well developed, well nourished, no acute distress Skin:  Warm and dry Neck:  Midline trachea, normal thyroid, good ROM, no lymphadenopathy Lungs; Clear to auscultation bilaterally Breast:  No dominant palpable mass, retraction, or nipple discharge Cardiovascular: Regular rate and rhythm Abdomen:  Soft, non tender, no hepatosplenomegaly Pelvic:  External genitalia is normal in appearance, no lesions.  The vagina is pale. Urethra has no lesions or masses.  The cervix is smooth.  Uterus is felt to be normal size, shape, and contour.  No adnexal masses, has general tenderness noted.Bladder is non tender, no masses felt. Rectal: Good sphincter tone, no polyps, + hemorrhoids felt.  Hemoccult negative. Extremities/musculoskeletal:  No swelling or varicosities noted, no clubbing or cyanosis Psych:  No mood changes, alert and cooperative,seems happy AA is 0 Fall risk is low    05/28/2023    1:24 PM 06/10/2021    2:08 PM 06/04/2020   10:58 AM  Depression screen PHQ 2/9  Decreased Interest 0 0 0  Down, Depressed, Hopeless 0 0 0  PHQ - 2 Score 0 0 0  Altered sleeping 0 0 0  Tired, decreased energy 1 1 0  Change in appetite 0 0 0  Feeling bad or failure about yourself  0 0 0  Trouble concentrating 0 0 0  Moving slowly or fidgety/restless 0 0 0  Suicidal thoughts 0 0 0  PHQ-9 Score 1 1 0       05/28/2023    1:24 PM 06/10/2021    2:12 PM 06/04/2020   10:59 AM  GAD 7 : Generalized Anxiety Score  Nervous, Anxious, on Edge 1 0 0  Control/stop worrying 1 0 0  Worry too much - different things 0 0 0  Trouble relaxing 0 0 0  Restless 0 0 0  Easily annoyed or irritable 0 0 0  Afraid - awful might happen 0 0 0  Total GAD 7 Score 2 0 0  Upstream - 05/28/23 1410       Pregnancy Intention Screening   Does the patient want to become pregnant in the next year? No    Does the patient's partner want to become pregnant in the next year? No    Would the patient like to discuss contraceptive options today? No      Contraception Wrap Up   Current Method No Method - Other Reason    End Method No Method - Other Reason   husband can't had prostate surgery   Contraception Counseling Provided No            Examination chaperoned by Malachy Mood LPN  Impression and Plan: 1. Encounter for well woman exam with routine gynecological exam Physical in 1 year Pap in 2027 Labs with PCP Colonoscopy per GI  2. Encounter for screening fecal occult  blood testing Hemoccult was negative  3. Cyst of left ovary Has simple 4.6 cm cyst left ovary,stable in size, [name] was 05/11/23  4. Hot flashes Hot flashes are bad  Will try Duavee  5. Irregular periods No period since June   6. Peri-menopause  7. Pelvic pain in female On pain generalized on and off in pelvic area   8. Tired +tried wants to sleep alo  9. Fibroids Has multiple small fibroids seen on [name] 05/11/23  10. Hormone replacement therapy (HRT) Denies MI,stroke,DVT or breast cancer Will try Duvaee, #28 tablets given  Follow up in 4 weeks to see if feels better   11. Rectal itching Will rx anusol HC  cream  Meds ordered this encounter  Medications   Conj Estrogens-Bazedoxifene (DUAVEE) 0.45-20 MG TABS    Sig: Take 1 tablet by mouth daily.    Order Specific Question:   Supervising Provider    Answer:   Lazaro Arms [[id number]]   hydrocortisone (ANUSOL-HC) 2.5 % rectal cream    Sig: Place 1 Application rectally 2 (two) times daily.    Dispense:  30 g    Refill:  0    Order Specific Question:   Supervising Provider    Answer:   Duane Lope H [[id number]]

## 2023-06-02 DIAGNOSIS — Z0000 Encounter for general adult medical examination without abnormal findings: Secondary | ICD-10-CM | POA: Diagnosis not present

## 2023-06-02 DIAGNOSIS — Z6824 Body mass index (BMI) 24.0-24.9, adult: Secondary | ICD-10-CM | POA: Diagnosis not present

## 2023-06-16 DIAGNOSIS — Z419 Encounter for procedure for purposes other than remedying health state, unspecified: Secondary | ICD-10-CM | POA: Diagnosis not present

## 2023-06-29 ENCOUNTER — Ambulatory Visit: Payer: Medicaid Other | Admitting: Adult Health

## 2023-07-02 DIAGNOSIS — R1031 Right lower quadrant pain: Secondary | ICD-10-CM | POA: Diagnosis not present

## 2023-07-02 DIAGNOSIS — D259 Leiomyoma of uterus, unspecified: Secondary | ICD-10-CM | POA: Diagnosis not present

## 2023-07-02 DIAGNOSIS — Z6824 Body mass index (BMI) 24.0-24.9, adult: Secondary | ICD-10-CM | POA: Diagnosis not present

## 2023-07-02 DIAGNOSIS — N83292 Other ovarian cyst, left side: Secondary | ICD-10-CM | POA: Diagnosis not present

## 2023-07-02 DIAGNOSIS — R112 Nausea with vomiting, unspecified: Secondary | ICD-10-CM | POA: Diagnosis not present

## 2023-07-02 DIAGNOSIS — N83202 Unspecified ovarian cyst, left side: Secondary | ICD-10-CM | POA: Diagnosis not present

## 2023-07-02 DIAGNOSIS — K219 Gastro-esophageal reflux disease without esophagitis: Secondary | ICD-10-CM | POA: Diagnosis not present

## 2023-07-02 DIAGNOSIS — R1084 Generalized abdominal pain: Secondary | ICD-10-CM | POA: Diagnosis not present

## 2023-07-06 ENCOUNTER — Ambulatory Visit (INDEPENDENT_AMBULATORY_CARE_PROVIDER_SITE_OTHER): Payer: Medicaid Other | Admitting: Adult Health

## 2023-07-06 ENCOUNTER — Encounter: Payer: Self-pay | Admitting: Adult Health

## 2023-07-06 VITALS — BP 132/78 | HR 63 | Ht 64.0 in | Wt 150.5 lb

## 2023-07-06 DIAGNOSIS — N83202 Unspecified ovarian cyst, left side: Secondary | ICD-10-CM | POA: Diagnosis not present

## 2023-07-06 DIAGNOSIS — Z79890 Hormone replacement therapy (postmenopausal): Secondary | ICD-10-CM

## 2023-07-06 DIAGNOSIS — R002 Palpitations: Secondary | ICD-10-CM | POA: Insufficient documentation

## 2023-07-06 DIAGNOSIS — R232 Flushing: Secondary | ICD-10-CM | POA: Diagnosis not present

## 2023-07-06 MED ORDER — DUAVEE 0.45-20 MG PO TABS: 1.0000 | ORAL_TABLET | Freq: Every day | ORAL | 6 refills | Status: DC

## 2023-07-06 NOTE — Progress Notes (Signed)
  Subjective:     Patient ID: Amber Russell, female   DOB: Sep 19, 1970, 52 y.o.   MRN: [id number]  HPI Amber Russell is a 52 year old female, married, [id number], back in follow up on starting [facility] and hot flashes are better. Had CT at [facility] 07/02/23 has 4.7 simple cyst left ovary. Feels foggy and has heart palpitations at times, has told PCP. She is getting labs soon.   PCP is Dr Olena Leatherwood.   Review of Systems Hot flashes are better +heart palpitations Feels foggy, can't concentrate Reviewed past medical,surgical, social and family history. Reviewed medications and allergies.     Objective:   Physical Exam BP 132/78 (BP Location: Left Arm, Patient Position: Sitting, Cuff Size: Normal)   Pulse 63   Ht 5\' 4"  (1.626 m)   Wt 150 lb 8 oz (68.3 kg)   BMI 25.83 kg/m     Skin warm and dry.  Lungs: clear to ausculation bilaterally. Cardiovascular: regular rate and rhythm.   Upstream - 07/06/23 1426       Pregnancy Intention Screening   Does the patient want to become pregnant in the next year? N/A    Does the patient's partner want to become pregnant in the next year? N/A    Would the patient like to discuss contraceptive options today? N/A      Contraception Wrap Up   Current Method No Method - Other Reason    Reason for No Current Contraceptive Method at Intake ([facility] Only) Other    End Method No Method - Other Reason    Contraception Counseling Provided No             Assessment:     1. Hot flashes Hot flashes a little better, maybe 2 a day on Duavee Will send Rx for  [facility] Meds ordered this encounter  Medications   Conj Estrogens-Bazedoxifene (DUAVEE) 0.45-20 MG TABS    Sig: Take 1 tablet by mouth daily.    Dispense:  30 tablet    Refill:  6    Order Specific Question:   Supervising Provider    Answer:   Despina Hidden, LUTHER H [[id number]]     2. Cyst of left ovary Had 4.7 cm simple cyst left ovary 07/02/23 at [facility], was seen in August on [name]  3. Hormone replacement  therapy (HRT) Rx sent in for [facility]  4. Heart palpitations Having palpitations, will refer to cardiology for evaluation  - Ambulatory referral to Cardiology     Plan:     Return in about 5 months for ROS and will get follow up US scheduled then

## 2023-07-17 DIAGNOSIS — Z419 Encounter for procedure for purposes other than remedying health state, unspecified: Secondary | ICD-10-CM | POA: Diagnosis not present

## 2023-07-21 DIAGNOSIS — M51369 Other intervertebral disc degeneration, lumbar region without mention of lumbar back pain or lower extremity pain: Secondary | ICD-10-CM | POA: Diagnosis not present

## 2023-07-21 DIAGNOSIS — M5416 Radiculopathy, lumbar region: Secondary | ICD-10-CM | POA: Diagnosis not present

## 2023-07-27 DIAGNOSIS — M48061 Spinal stenosis, lumbar region without neurogenic claudication: Secondary | ICD-10-CM | POA: Diagnosis not present

## 2023-07-27 DIAGNOSIS — M4726 Other spondylosis with radiculopathy, lumbar region: Secondary | ICD-10-CM | POA: Diagnosis not present

## 2023-07-27 DIAGNOSIS — M5116 Intervertebral disc disorders with radiculopathy, lumbar region: Secondary | ICD-10-CM | POA: Diagnosis not present

## 2023-08-04 DIAGNOSIS — M5416 Radiculopathy, lumbar region: Secondary | ICD-10-CM | POA: Diagnosis not present

## 2023-08-08 ENCOUNTER — Other Ambulatory Visit: Payer: Self-pay

## 2023-08-08 ENCOUNTER — Emergency Department (HOSPITAL_COMMUNITY)
Admission: EM | Admit: 2023-08-08 | Discharge: 2023-08-09 | Disposition: A | Discharge status: Home / Self Care | Payer: Medicaid Other | Attending: Emergency Medicine | Admitting: Emergency Medicine

## 2023-08-08 DIAGNOSIS — N83202 Unspecified ovarian cyst, left side: Secondary | ICD-10-CM | POA: Diagnosis not present

## 2023-08-08 DIAGNOSIS — K219 Gastro-esophageal reflux disease without esophagitis: Secondary | ICD-10-CM | POA: Insufficient documentation

## 2023-08-08 DIAGNOSIS — R1031 Right lower quadrant pain: Secondary | ICD-10-CM | POA: Diagnosis not present

## 2023-08-08 NOTE — ED Triage Notes (Signed)
Pt arrived via POV c/o RLQ abdominal pain. Pt reports pain was present 1 month ago and was evaluated at [facility]. Pt endorse Nausea.

## 2023-08-09 ENCOUNTER — Emergency Department (HOSPITAL_COMMUNITY): Payer: Medicaid Other

## 2023-08-09 ENCOUNTER — Encounter (HOSPITAL_COMMUNITY): Payer: Self-pay

## 2023-08-09 DIAGNOSIS — R1031 Right lower quadrant pain: Secondary | ICD-10-CM | POA: Diagnosis not present

## 2023-08-09 LAB — CBC WITH DIFFERENTIAL/PLATELET
Abs Immature Granulocytes: 0.01 10*3/uL (ref 0.00–0.07)
Basophils Absolute: 0.1 10*3/uL (ref 0.0–0.1)
Basophils Relative: 1 %
Eosinophils Absolute: 0.1 10*3/uL (ref 0.0–0.5)
Eosinophils Relative: 2 %
HCT: 42.1 % (ref 36.0–46.0)
Hemoglobin: 14.2 g/dL (ref 12.0–15.0)
Immature Granulocytes: 0 %
Lymphocytes Relative: 43 %
Lymphs Abs: 2.3 10*3/uL (ref 0.7–4.0)
MCH: 29.9 pg (ref 26.0–34.0)
MCHC: 33.7 g/dL (ref 30.0–36.0)
MCV: 88.6 fL (ref 80.0–100.0)
Monocytes Absolute: 0.5 10*3/uL (ref 0.1–1.0)
Monocytes Relative: 9 %
Neutro Abs: 2.4 10*3/uL (ref 1.7–7.7)
Neutrophils Relative %: 45 %
Platelets: 153 10*3/uL (ref 150–400)
RBC: 4.75 MIL/uL (ref 3.87–5.11)
RDW: 13.6 % (ref 11.5–15.5)
WBC: 5.4 10*3/uL (ref 4.0–10.5)
nRBC: 0 % (ref 0.0–0.2)

## 2023-08-09 LAB — COMPREHENSIVE METABOLIC PANEL
ALT: 14 U/L (ref 0–44)
AST: 16 U/L (ref 15–41)
Albumin: 4.3 g/dL (ref 3.5–5.0)
Alkaline Phosphatase: 41 U/L (ref 38–126)
Anion gap: 11 (ref 5–15)
BUN: 12 mg/dL (ref 6–20)
CO2: 22 mmol/L (ref 22–32)
Calcium: 9.4 mg/dL (ref 8.9–10.3)
Chloride: 105 mmol/L (ref 98–111)
Creatinine, Ser: 0.58 mg/dL (ref 0.44–1.00)
GFR, Estimated: >60 mL/min (ref >60)
Glucose, Bld: 88 mg/dL (ref 70–99)
Potassium: 3.9 mmol/L (ref 3.5–5.1)
Sodium: 138 mmol/L (ref 135–145)
Total Bilirubin: 1.4 mg/dL — ABNORMAL HIGH (ref <1.2)
Total Protein: 6.9 g/dL (ref 6.5–8.1)

## 2023-08-09 LAB — URINALYSIS, ROUTINE W REFLEX MICROSCOPIC
Bilirubin Urine: NEGATIVE
Glucose, UA: NEGATIVE mg/dL
Hgb urine dipstick: NEGATIVE
Ketones, ur: NEGATIVE mg/dL
Leukocytes,Ua: NEGATIVE
Nitrite: NEGATIVE
Protein, ur: NEGATIVE mg/dL
Specific Gravity, Urine: 1.003 — ABNORMAL LOW (ref 1.005–1.030)
pH: 7 (ref 5.0–8.0)

## 2023-08-09 LAB — PREGNANCY, URINE: Preg Test, Ur: NEGATIVE

## 2023-08-09 LAB — LIPASE, BLOOD: Lipase: 45 U/L (ref 11–51)

## 2023-08-09 MED ORDER — SODIUM CHLORIDE 0.9 % IV BOLUS
1000.0000 mL | Freq: Once | INTRAVENOUS | Status: AC
  Administered 2023-08-09: 1000 mL via INTRAVENOUS

## 2023-08-09 MED ORDER — IOHEXOL 300 MG/ML  SOLN
100.0000 mL | Freq: Once | INTRAMUSCULAR | Status: AC | PRN
  Administered 2023-08-09: 100 mL via INTRAVENOUS

## 2023-08-09 MED ORDER — TRAMADOL HCL 50 MG PO TABS: 50.0000 mg | ORAL_TABLET | Freq: Four times a day (QID) | ORAL | 0 refills | Status: DC | PRN

## 2023-08-09 NOTE — ED Provider Notes (Signed)
[place] EMERGENCY DEPARTMENT AT [facility] Provider Note   CSN: [id number] Arrival date & time: 08/08/23  2343     History  Chief Complaint  Patient presents with   Abdominal Pain    Beanca A Lobos is a 52 y.o. female.  Patient is a 52 year old female with past medical history of prior cholecystectomy, C-section, GERD, migraines, fibroids, ovarian cyst.  Patient presenting today with complaints of right lower quadrant pain.  This started approximately 1 week ago and is worsening.  She had similar symptoms 1 month ago and was seen at [facility] where she underwent laboratory studies and CT scan of the abdomen and pelvis, but no definitive cause was found.  She was started on antibiotics for a possible UTI.  Patient denies to me she is having any bowel or bladder complaints.  No fevers or chills.  Pain worse with movement and palpation with no alleviating factors.  The history is provided by the patient.       Home Medications Prior to Admission medications   Medication Sig Start Date End Date Taking? Authorizing Provider  AIMOVIG 70 MG/ML SOAJ SMARTSIG:70 Milligram(s) SUB-Q Once a Month 06/08/23   [provider]  Conj Estrogens-Bazedoxifene (DUAVEE) 0.45-20 MG TABS Take 1 tablet by mouth daily. 07/06/23   Adline Potter, NP  dicyclomine (BENTYL) 20 MG tablet Take by mouth. 07/02/23 07/12/23  [provider]  hydrocortisone (ANUSOL-HC) 2.5 % rectal cream Place 1 Application rectally 2 (two) times daily. 05/28/23   Adline Potter, NP  methylPREDNISolone (MEDROL DOSEPAK) 4 MG TBPK tablet TAKE 6 TABLETS ON DAY 1 AS DIRECTED ON PACKAGE AND DECREASE BY 1 TAB EACH DAY FOR A TOTAL OF 6 DAYS 07/02/23   [provider]  NURTEC 75 MG TBDP Take 75 mg by mouth daily as needed (migraines). 07/20/19   [provider]  omeprazole (PRILOSEC) 20 MG capsule Take 20 mg by mouth daily as needed (acid reflux). 01/02/21   [provider]  topiramate (TOPAMAX) 100 MG tablet Take 100 mg by mouth 2 (two) times daily.    [provider]      Allergies    Patient has no known allergies.    Review of Systems   Review of Systems  All other systems reviewed and are negative.   Physical Exam Updated Vital Signs BP (!) 151/87 (BP Location: Left Arm)   Pulse 66   Temp 97.8 F (36.6 C) (Oral)   Resp 16   Ht 5\' 4"  (1.626 m)   Wt 68 kg   SpO2 98%   BMI 25.73 kg/m  Physical Exam Vitals and nursing note reviewed.  Constitutional:      General: She is not in acute distress.    Appearance: She is well-developed. She is not diaphoretic.  HENT:     Head: Normocephalic and atraumatic.  Cardiovascular:     Rate and Rhythm: Normal rate and regular rhythm.     Heart sounds: No murmur heard.    No friction rub. No gallop.  Pulmonary:     Effort: Pulmonary effort is normal. No respiratory distress.     Breath sounds: Normal breath sounds. No wheezing.  Abdominal:     General: Bowel sounds are normal. There is no distension.     Palpations: Abdomen is soft.     Tenderness: There is abdominal tenderness in the right lower quadrant. There is no right CVA tenderness, left CVA tenderness, guarding or rebound.  Musculoskeletal:  General: Normal range of motion.     Cervical back: Normal range of motion and neck supple.  Skin:    General: Skin is warm and dry.  Neurological:     General: No focal deficit present.     Mental Status: She is alert and oriented to person, place, and time.     ED Results / Procedures / Treatments   Labs (all labs ordered are listed, but only abnormal results are displayed) Labs Reviewed  URINALYSIS, ROUTINE W REFLEX MICROSCOPIC  LIPASE, BLOOD  CBC WITH DIFFERENTIAL/PLATELET  COMPREHENSIVE METABOLIC PANEL    EKG None  Radiology No results found.  Procedures Procedures    Medications Ordered in ED Medications  sodium chloride 0.9 % bolus 1,000 mL (has  no administration in time range)    ED Course/ Medical Decision Making/ A&P  Patient is a 52 year old female with past medical history as per HPI presenting with right lower quadrant pain.  Patient arrives with stable vital signs and is afebrile.  She does have tenderness to the right lower quadrant, but no rebound or guarding.  Workup initiated including CBC, CMP, and lipase, all of which are unremarkable.  There is no leukocytosis, no elevation of liver or pancreatic enzymes, and no electrolyte derangement.  Urinalysis is clear and urine pregnancy test is negative.  CT scan of the abdomen and pelvis obtained showing no evidence for appendicitis or other right lower quadrant pathology.  She does have a 5.1 cm ovarian cyst on the left which is slightly increased in size from last month.  Radiology has recommended a follow-up ultrasound in 6 to 12 months.  At this point, I feel as though patient can safely be discharged.  I have identified no surgical pathology such as appendicitis.  She has had a cholecystectomy in the past, so gallbladder issues are not a concern.  Patient to be discharged with tramadol and follow-up with her GYN regarding the ovarian cyst.  Final Clinical Impression(s) / ED Diagnoses Final diagnoses:  None    Rx / DC Orders ED Discharge Orders     None         Geoffery Lyons, MD 08/09/23 [phone]

## 2023-08-09 NOTE — Discharge Instructions (Addendum)
Begin taking tramadol as prescribed as needed for pain.  Follow-up with your GYN regarding your ovarian cyst.  An ultrasound has been recommended in 6 to 12 months for reevaluation of your cyst.  Return to the ER if your symptoms significantly worsen or change.

## 2023-08-09 NOTE — ED Notes (Signed)
Patient transported to CT 

## 2023-08-10 ENCOUNTER — Ambulatory Visit (INDEPENDENT_AMBULATORY_CARE_PROVIDER_SITE_OTHER): Payer: Medicaid Other | Admitting: Adult Health

## 2023-08-10 ENCOUNTER — Encounter: Payer: Self-pay | Admitting: Adult Health

## 2023-08-10 VITALS — BP 132/79 | HR 64 | Ht 64.0 in | Wt 147.0 lb

## 2023-08-10 DIAGNOSIS — R102 Pelvic and perineal pain: Secondary | ICD-10-CM

## 2023-08-10 DIAGNOSIS — N83202 Unspecified ovarian cyst, left side: Secondary | ICD-10-CM | POA: Diagnosis not present

## 2023-08-10 NOTE — Progress Notes (Signed)
  Subjective:     Patient ID: Amber Russell, female   DOB: 03-16-1971, 52 y.o.   MRN: [id number]  HPI Amber Russell is a 52 year old female, married, PM, in complaining of having pelvic pain, esp RLQ, and was seen in ER. CT showed left ovarian cyst, 5.1 cm that looks slightly larger. She says she does not sleep well, has to lay on back.     Component Value Date/Time   DIAGPAP  05/04/2023 1039    - Negative for intraepithelial lesion or malignancy (NILM)   DIAGPAP  05/30/2019 0000    NEGATIVE FOR INTRAEPITHELIAL LESIONS OR MALIGNANCY.   HPVHIGH Negative 05/04/2023 1039   ADEQPAP  05/04/2023 1039    Satisfactory for evaluation; transformation zone component PRESENT.   ADEQPAP  05/30/2019 0000    Satisfactory for evaluation  endocervical/transformation zone component PRESENT.    PCP is Dr Olena Leatherwood   Review of Systems RLQ pain Can't sleep except on back  Has nodule right lower jaw line that is mobile, will follow up with PCP Reviewed past medical,surgical, social and family history. Reviewed medications and allergies.     Objective:   Physical Exam BP 132/79 (BP Location: Left Arm, Patient Position: Sitting, Cuff Size: Normal)   Pulse 64   Ht 5\' 4"  (1.626 m)   Wt 147 lb (66.7 kg)   BMI 25.23 kg/m     Skin warm and dry. Has mobile nodule in the skin right lower jaw line.  Lungs: clear to ausculation bilaterally. Cardiovascular: regular rate and rhythm.    Assessment:     1. Cyst of left ovary 5.1 cm cyst left ovary, seen on CT   2. Pelvic pain in female Esp RLQ    Plan:     Return in 2 weeks to discuss cyst removal with Dr Despina Hidden

## 2023-08-16 DIAGNOSIS — Z419 Encounter for procedure for purposes other than remedying health state, unspecified: Secondary | ICD-10-CM | POA: Diagnosis not present

## 2023-08-19 ENCOUNTER — Ambulatory Visit: Payer: Medicaid Other | Admitting: Internal Medicine

## 2023-08-24 ENCOUNTER — Ambulatory Visit (INDEPENDENT_AMBULATORY_CARE_PROVIDER_SITE_OTHER): Payer: Medicaid Other | Admitting: Obstetrics & Gynecology

## 2023-08-24 ENCOUNTER — Encounter: Payer: Self-pay | Admitting: Obstetrics & Gynecology

## 2023-08-24 VITALS — BP 116/77 | HR 76 | Ht 64.0 in

## 2023-08-24 DIAGNOSIS — N83202 Unspecified ovarian cyst, left side: Secondary | ICD-10-CM

## 2023-08-24 DIAGNOSIS — R1031 Right lower quadrant pain: Secondary | ICD-10-CM

## 2023-08-24 NOTE — Progress Notes (Signed)
Follow up appointment for results: RLQ pain with left ovarian cyst  Chief Complaint  Patient presents with   Ovarian Cyst    Blood pressure 116/77, pulse 76, height 5\' 4"  (1.626 m).  Reviewed CT/sonogram results  Exam: +Cornet's sign for abdominal wall pain, ?related to her C section scar, no unusual activity  Trigger Point Injection   Pre-operative diagnosis: neurogenic pain, iliohypogastric  Post-operative diagnosis: same  After risks and benefits were explained including bleeding, infection, worsening of the pain, damage to the area being injected, weakness, allergic reaction to medications, vascular injection, and nerve damage, signed consent was obtained.  All questions were answered.    The area of the trigger point was identified and the skin prepped three times with alcohol and the alcohol allowed to dry.  Next, a 25 gauge 0.5 inch needle was placed in the area of the trigger point.  Once reproduction of the pain was elicited and negative aspiration confirmed, the trigger point was injected and the needle removed.    The patient did tolerate the procedure well and there were not complications.    Medication used: 10 cc maracine plain   Trigger points injected: 1    Trigger point(s) location(s):  right  MEDS ordered this encounter: No orders of the defined types were placed in this encounter.   Orders for this encounter: Orders Placed This Encounter  Procedures   Ovarian Malignancy Risk-ROMA    Impression + Management Plan   ICD-10-CM   1. Cyst of left ovary  N83.202 Ovarian Malignancy Risk-ROMA    2. Right lower quadrant pain  R10.31     3. Abdominal wall pain in right lower quadrant  R10.31    injected today 10 cc marcaine      Follow Up: Return in about 1 month (around 09/24/2023) for Follow up, with Dr Despina Hidden.     All questions were answered.  Past Medical History:  Diagnosis Date   Contraceptive management 02/15/2015   GERD without esophagitis     Hep C w/o coma, chronic (HCC)    Hx of migraines    Nexplanon removal 02/15/2015    Past Surgical History:  Procedure Laterality Date   BILE DUCT EXPLORATION N/A    CESAREAN SECTION     CESAREAN SECTION     age 52   CHOLECYSTECTOMY     COLONOSCOPY WITH PROPOFOL N/A 07/03/2021   Procedure: COLONOSCOPY WITH PROPOFOL;  Surgeon: Dolores Frame, MD;  [facility]: [facility];  Service: Gastroenterology;  Laterality: N/A;  10:05 AM   KNEE ARTHROPLASTY     POLYPECTOMY  07/03/2021   Procedure: POLYPECTOMY INTESTINAL;  Surgeon: Marguerita Merles, Reuel Boom, MD;  Location: [facility];  Service: Gastroenterology;;    OB History     Gravida  4   Para  3   Term  3   Preterm  0   AB  1   Living  3      SAB  1   IAB  0   Ectopic  0   Multiple      Live Births              No Known Allergies  Social History   Socioeconomic History   Marital status: Married    Spouse name: Not on file   Number of children: Not on file   Years of education: Not on file   Highest education level: Not on file  Occupational History   Not on file  Tobacco Use   Smoking status: Never   Smokeless tobacco: Never  Vaping Use   Vaping status: Never Used  Substance and Sexual Activity   Alcohol use: Never   Drug use: Never   Sexual activity: Yes    Birth control/protection: None, Other-see comments    Comment: husband had prostate sugery   Other Topics Concern   Not on file  Social History Narrative   ** Merged History Encounter **       Social Determinants of Health   Financial Resource Strain: Low Risk  (05/28/2023)   Overall Financial Resource Strain (CARDIA)    Difficulty of Paying Living Expenses: Not very hard  Food Insecurity: No Food Insecurity (05/28/2023)   Hunger Vital Sign    Worried About Running Out of Food in the Last Year: Never true    Ran Out of Food in the Last Year: Never true  Transportation Needs: No Transportation Needs (05/28/2023)   PRAPARE -  Administrator, Civil Service (Medical): No    Lack of Transportation (Non-Medical): No  Physical Activity: Sufficiently Active (05/28/2023)   Exercise Vital Sign    Days of Exercise per Week: 7 days    Minutes of Exercise per Session: 40 min  Stress: No Stress Concern Present (05/28/2023)   [employer] of Occupational Health - Occupational Stress Questionnaire    Feeling of Stress : Only a little  Social Connections: Moderately Integrated (05/28/2023)   Social Connection and Isolation Panel [NHANES]    Frequency of Communication with Friends and Family: More than three times a week    Frequency of Social Gatherings with Friends and Family: Once a week    Attends Religious Services: 1 to 4 times per year    Active Member of [employer] or Organizations: No    Attends Banker Meetings: Never    Marital Status: Married    Family History  Problem Relation Age of Onset   Allergic rhinitis Son    Asthma Son

## 2023-08-25 LAB — OVARIAN MALIGNANCY RISK-ROMA
Cancer Antigen (CA) 125: 15.8 U/mL (ref 0.0–38.1)
HE4: 48.4 pmol/L (ref 0.0–105.2)
Postmenopausal ROMA: 1.16
Premenopausal ROMA: 0.7

## 2023-08-25 LAB — POSTMENOPAUSAL INTERP: LOW

## 2023-08-25 LAB — PREMENOPAUSAL INTERP: LOW

## 2023-09-16 DIAGNOSIS — Z419 Encounter for procedure for purposes other than remedying health state, unspecified: Secondary | ICD-10-CM | POA: Diagnosis not present

## 2023-09-23 DIAGNOSIS — M5416 Radiculopathy, lumbar region: Secondary | ICD-10-CM | POA: Diagnosis not present

## 2023-09-24 ENCOUNTER — Ambulatory Visit (INDEPENDENT_AMBULATORY_CARE_PROVIDER_SITE_OTHER): Payer: Medicaid Other | Admitting: Obstetrics & Gynecology

## 2023-09-24 VITALS — BP 122/78 | HR 62

## 2023-09-24 DIAGNOSIS — R1031 Right lower quadrant pain: Secondary | ICD-10-CM | POA: Diagnosis not present

## 2023-09-24 NOTE — Progress Notes (Signed)
 Follow up appointment for results: Neurogenic pain  Chief Complaint  Patient presents with   Follow-up    Blood pressure 122/78, pulse 62.  No results found.  S/P injection of right ilioinguinal trigger poiint, related to corner of c section scar, unknown trigger for it  MEDS ordered this encounter: No orders of the defined types were placed in this encounter.   Orders for this encounter: No orders of the defined types were placed in this encounter.   Impression + Management Plan   ICD-10-CM   1. Abdominal wall pain in right lower quadrant  R10.31    Much improved, does not feel she needs an injection today, recommend topical lidocaine  patches + capczin cream prn      Follow Up: Return if symptoms worsen or fail to improve.     All questions were answered.  Past Medical History:  Diagnosis Date   Contraceptive management 02/15/2015   GERD without esophagitis    Hep C w/o coma, chronic (HCC)    Hx of migraines    Nexplanon removal 02/15/2015    Past Surgical History:  Procedure Laterality Date   BILE DUCT EXPLORATION N/A    CESAREAN SECTION     CESAREAN SECTION     age 53   CHOLECYSTECTOMY     COLONOSCOPY WITH PROPOFOL  N/A 07/03/2021   Procedure: COLONOSCOPY WITH PROPOFOL ;  Surgeon: Eartha Angelia Sieving, MD;  Location: [facility];  Service: Gastroenterology;  Laterality: N/A;  10:05 AM   KNEE ARTHROPLASTY     POLYPECTOMY  07/03/2021   Procedure: POLYPECTOMY INTESTINAL;  Surgeon: Eartha Angelia, Sieving, MD;  Location: [facility];  Service: Gastroenterology;;    OB History     Gravida  4   Para  3   Term  3   Preterm  0   AB  1   Living  3      SAB  1   IAB  0   Ectopic  0   Multiple      Live Births              No Known Allergies  Social History   Socioeconomic History   Marital status: Married    Spouse name: Not on file   Number of children: Not on file   Years of education: Not on file   Highest education  level: Not on file  Occupational History   Not on file  Tobacco Use   Smoking status: Never   Smokeless tobacco: Never  Vaping Use   Vaping status: Never Used  Substance and Sexual Activity   Alcohol use: Never   Drug use: Never   Sexual activity: Yes    Birth control/protection: None, Other-see comments    Comment: husband had prostate sugery   Other Topics Concern   Not on file  Social History Narrative   ** Merged History Encounter **       Social Drivers of Health   Financial Resource Strain: Low Risk  (05/28/2023)   Overall Financial Resource Strain (CARDIA)    Difficulty of Paying Living Expenses: Not very hard  Food Insecurity: No Food Insecurity (05/28/2023)   Hunger Vital Sign    Worried About Running Out of Food in the Last Year: Never true    Ran Out of Food in the Last Year: Never true  Transportation Needs: No Transportation Needs (05/28/2023)   PRAPARE - Transportation    Lack of Transportation (Medical): No    Lack of  Transportation (Non-Medical): No  Physical Activity: Sufficiently Active (05/28/2023)   Exercise Vital Sign    Days of Exercise per Week: 7 days    Minutes of Exercise per Session: 40 min  Stress: No Stress Concern Present (05/28/2023)   [employer] of Occupational Health - Occupational Stress Questionnaire    Feeling of Stress : Only a little  Social Connections: Moderately Integrated (05/28/2023)   Social Connection and Isolation Panel [NHANES]    Frequency of Communication with Friends and Family: More than three times a week    Frequency of Social Gatherings with Friends and Family: Once a week    Attends Religious Services: 1 to 4 times per year    Active Member of [employer] or Organizations: No    Attends Banker Meetings: Never    Marital Status: Married    Family History  Problem Relation Age of Onset   Allergic rhinitis Son    Asthma Son

## 2023-09-28 DIAGNOSIS — J301 Allergic rhinitis due to pollen: Secondary | ICD-10-CM | POA: Diagnosis not present

## 2023-09-28 DIAGNOSIS — G43111 Migraine with aura, intractable, with status migrainosus: Secondary | ICD-10-CM | POA: Diagnosis not present

## 2023-09-28 DIAGNOSIS — Z6824 Body mass index (BMI) 24.0-24.9, adult: Secondary | ICD-10-CM | POA: Diagnosis not present

## 2023-09-28 DIAGNOSIS — L639 Alopecia areata, unspecified: Secondary | ICD-10-CM | POA: Diagnosis not present

## 2023-09-28 DIAGNOSIS — M1710 Unilateral primary osteoarthritis, unspecified knee: Secondary | ICD-10-CM | POA: Diagnosis not present

## 2023-10-17 DIAGNOSIS — Z419 Encounter for procedure for purposes other than remedying health state, unspecified: Secondary | ICD-10-CM | POA: Diagnosis not present

## 2023-10-19 ENCOUNTER — Ambulatory Visit: Payer: Medicaid Other | Admitting: Internal Medicine

## 2023-10-26 ENCOUNTER — Ambulatory Visit: Payer: Medicaid Other | Attending: Internal Medicine

## 2023-10-26 ENCOUNTER — Ambulatory Visit: Payer: Medicaid Other | Attending: Internal Medicine | Admitting: Internal Medicine

## 2023-10-26 ENCOUNTER — Encounter: Payer: Self-pay | Admitting: Internal Medicine

## 2023-10-26 VITALS — Ht 64.0 in | Wt 141.0 lb

## 2023-10-26 DIAGNOSIS — R0789 Other chest pain: Secondary | ICD-10-CM | POA: Insufficient documentation

## 2023-10-26 DIAGNOSIS — R42 Dizziness and giddiness: Secondary | ICD-10-CM | POA: Diagnosis not present

## 2023-10-26 DIAGNOSIS — R002 Palpitations: Secondary | ICD-10-CM

## 2023-10-26 NOTE — Progress Notes (Signed)
 Cardiology Office Note  Date: 10/26/2023   ID: Amber Russell, DOB 07/06/1971, MRN [id number]  PCP:  Veda Gerald, MD  Cardiologist:  Chavela Justiniano P Trecia Maring, MD Electrophysiologist:  None   History of Present Illness: Amber Russell is a 53 y.o. female with no past medical history except for migraines was referred to cardiology clinic for evaluation of palpitations.  Ongoing palpitations for quite a while, occurs once a week, feels like her heart is, beat and symptoms fast beating.  She used to have frequent palpitations in the past but after cutting back on the coffee, palpitations improved but never resolved.  Currently drinks 1 cup of coffee in the morning and 1 cup of tea in the evening.  Previously used to drink 3 cups of coffee in the day.  She is physically active at baseline, performs all her household chores with no issues or complaints of chest pain or SOB.  She although she gets chest pains at rest but none with exertion.  She also feels dizzy with both rest and exertion.  Feels like the room is spinning around, sometimes.  No syncope, leg swelling or DOE.  Past Medical History:  Diagnosis Date   Contraceptive management 02/15/2015   GERD without esophagitis    Hep C w/o coma, chronic (HCC)    Hx of migraines    Nexplanon removal 02/15/2015    Past Surgical History:  Procedure Laterality Date   BILE DUCT EXPLORATION N/A    CESAREAN SECTION     CESAREAN SECTION     age 32   CHOLECYSTECTOMY     COLONOSCOPY WITH PROPOFOL  N/A 07/03/2021   Procedure: COLONOSCOPY WITH PROPOFOL ;  Surgeon: Urban Garden, MD;  [facility];  Service: Gastroenterology;  Laterality: N/A;  10:05 AM   KNEE ARTHROPLASTY     POLYPECTOMY  07/03/2021   Procedure: POLYPECTOMY INTESTINAL;  Surgeon: Umberto Ganong, Bearl Limes, MD;  Location: [facility];  Service: Gastroenterology;;    Current Outpatient Medications  Medication Sig Dispense Refill   AIMOVIG 70 MG/ML SOAJ  SMARTSIG:70 Milligram(s) SUB-Q Once a Month     Conj Estrogens-Bazedoxifene (DUAVEE ) 0.45-20 MG TABS Take 1 tablet by mouth daily. 30 tablet 6   dicyclomine (BENTYL) 20 MG tablet Take by mouth.     NURTEC 75 MG TBDP Take 75 mg by mouth daily as needed (migraines).     omeprazole  (PRILOSEC) 20 MG capsule Take 20 mg by mouth daily as needed (acid reflux).     topiramate (TOPAMAX) 100 MG tablet Take 100 mg by mouth 2 (two) times daily.     traMADol  (ULTRAM ) 50 MG tablet Take 1 tablet (50 mg total) by mouth every 6 (six) hours as needed. 15 tablet 0   hydrocortisone  (ANUSOL -HC) 2.5 % rectal cream Place 1 Application rectally 2 (two) times daily. (Patient not taking: Reported on 08/10/2023) 30 g 0   Current Facility-Administered Medications  Medication Dose Route Frequency Provider Last Rate Last Admin   predniSONE  (DELTASONE ) tablet 10 mg  10 mg Oral UD Bobbitt, Colen Daunt, MD       Allergies:  Patient has no known allergies.   Social History: The patient  reports that she has never smoked. She has never used smokeless tobacco. She reports that she does not drink alcohol and does not use drugs.   Family History: The patient's family history includes Allergic rhinitis in her son; Asthma in her son.   ROS:  Please see the history of present  illness. Otherwise, complete review of systems is positive for none.  All other systems are reviewed and negative.   Physical Exam: VS:  Ht 5\' 4"  (1.626 m)   Wt 141 lb (64 kg)   SpO2 97%   BMI 24.20 kg/m , BMI Body mass index is 24.2 kg/m.  Wt Readings from Last 3 Encounters:  10/26/23 141 lb (64 kg)  08/10/23 147 lb (66.7 kg)  08/08/23 149 lb 14.6 oz (68 kg)    General: Patient appears comfortable at rest. HEENT: Conjunctiva and lids normal, oropharynx clear with moist mucosa. Neck: Supple, no elevated JVP or carotid bruits, no thyromegaly. Lungs: Clear to auscultation, nonlabored breathing at rest. Cardiac: Regular rate and rhythm, no S3 or  significant systolic murmur, no pericardial rub. Abdomen: Soft, nontender, no hepatomegaly, bowel sounds present, no guarding or rebound. Extremities: No pitting edema, distal pulses 2+. Skin: Warm and dry. Musculoskeletal: No kyphosis. Neuropsychiatric: Alert and oriented x3, affect grossly appropriate.  Recent Labwork: 08/09/2023: ALT 14; AST 16; BUN 12; Creatinine, Ser 0.58; Hemoglobin 14.2; Platelets 153; Potassium 3.9; Sodium 138  No results found for: "CHOL", "TRIG", "HDL", "CHOLHDL", "VLDL", "LDLCALC", "LDLDIRECT"   Assessment and Plan:  Palpitations: Ongoing palpitations once per week, fast beating and also feels like her heart is skipping a beat.  Used to drink 3 cups of coffee in the past but after she cut back on the coffee, her palpitations improved but she continued to have some.  Will obtain 2-week event monitor to rule out any arrhythmias.   Dizziness: She feels dizzy at rest and exertion, sometimes feels like the room is spinning around her. Recommended OTC meclizine for symptomatic relief.  Will obtain 2-week event monitor as stated above to rule out any arrhythmias or conduction abnormalities.  Orthostatic vitals obtained today in the clinic are negative for orthostatic hypotension and POTS.  Noncardiac chest pain: She gets chest pains at rest and no chest pain with exertion.  No family history of CAD.  No risk factors.  No further cardiac workup is indicated.    Medication Adjustments/Labs and Tests Ordered: Current medicines are reviewed at length with the patient today.  Concerns regarding medicines are outlined above.    Disposition:  Follow up pending results.  Signed, Camyra Vaeth Beauford Bounds, MD, 10/26/2023 12:18 PM    [facility] [address], [place], [place] [address]

## 2023-10-26 NOTE — Addendum Note (Signed)
 Addended by: Dajion Bickford G on: 10/26/2023 04:14 PM   Modules accepted: Orders

## 2023-10-26 NOTE — Patient Instructions (Signed)
 Medication Instructions:  Your physician recommends that you continue on your current medications as directed. Please refer to the Current Medication list given to you today.  *If you need a refill on your cardiac medications before your next appointment, please call your pharmacy*   Lab Work: NONE   If you have labs (blood work) drawn today and your tests are completely normal, you will receive your results only by: MyChart Message (if you have MyChart) OR A paper copy in the mail If you have any lab test that is abnormal or we need to change your treatment, we will call you to review the results.   Testing/Procedures: ZIO XT- Long Term Monitor Instructions   Your physician has requested you wear your ZIO patch monitor____14___days.   This is a single patch monitor.  [employer] supplies one patch monitor per enrollment.  Additional stickers are not available.   Please do not apply patch if you will be having a Nuclear Stress Test, Echocardiogram, Cardiac CT, MRI, or Chest Xray during the time frame you would be wearing the monitor. The patch cannot be worn during these tests.  You cannot remove and re-apply the ZIO XT patch monitor.   Your ZIO patch monitor will be sent USPS Priority mail from [facility] directly to your home address. The monitor may also be mailed to a PO BOX if home delivery is not available.   It may take 3-5 days to receive your monitor after you have been enrolled.   Once you have received you monitor, please review enclosed instructions.  Your monitor has already been registered assigning a specific monitor serial # to you.   Applying the monitor   Shave hair from upper left chest.   Hold abrader disc by orange tab.  Rub abrader in 40 strokes over left upper chest as indicated in your monitor instructions.   Clean area with 4 enclosed alcohol pads .  Use all pads to assure are is cleaned thoroughly.  Let dry.   Apply patch as indicated in monitor  instructions.  Patch will be place under collarbone on left side of chest with arrow pointing upward.   Rub patch adhesive wings for 2 minutes.Remove white label marked "1".  Remove white label marked "2".  Rub patch adhesive wings for 2 additional minutes.   While looking in a mirror, press and release button in center of patch.  A small green light will flash 3-4 times .  This will be your only indicator the monitor has been turned on.     Do not shower for the first 24 hours.  You may shower after the first 24 hours.   Press button if you feel a symptom. You will hear a small click.  Record Date, Time and Symptom in the Patient Log Book.   When you are ready to remove patch, follow instructions on last 2 pages of Patient Log Book.  Stick patch monitor onto last page of Patient Log Book.   Place Patient Log Book in Crystal box.  Use locking tab on box and tape box closed securely.  The Orange and [employer] has [employer] on it.  Please place in mailbox as soon as possible.  Your physician should have your test results approximately 7 days after the monitor has been mailed back to [facility]   Call [facility] Customer Care at [phone] if you have questions regarding your ZIO XT patch monitor.  Call them immediately if you see an orange  light blinking on your monitor.   If your monitor falls off in less than 4 days contact our Monitor department at [phone].  If your monitor becomes loose or falls off after 4 days call [employer] at [phone] for suggestions on securing your monitor.     Follow-Up: At [facility], you and your health needs are our priority.  As part of our continuing mission to provide you with exceptional heart care, we have created designated Provider Care Teams.  These Care Teams include your primary Cardiologist (physician) and Advanced Practice Providers (APPs -  Physician Assistants and Nurse Practitioners) who all work together to provide  you with the care you need, when you need it.  We recommend signing up for the patient portal called "MyChart".  Sign up information is provided on this After Visit Summary.  MyChart is used to connect with patients for Virtual Visits (Telemedicine).  Patients are able to view lab/test results, encounter notes, upcoming appointments, etc.  Non-urgent messages can be sent to your provider as well.   To learn more about what you can do with MyChart, go to [email or web address].    Your next appointment:    Pending results   Provider:   You may see Vishnu P Mallipeddi, MD or one of the following Advanced Practice Providers on your designated Care Team:   Woodfin Hays, PA-C  Theotis Flake, PA-C     Other Instructions Thank you for choosing [facility]!

## 2023-10-29 ENCOUNTER — Telehealth: Payer: Self-pay | Admitting: Internal Medicine

## 2023-10-29 NOTE — Telephone Encounter (Signed)
Pt called in stating her skin is itching very badly from heart monitor, please advise.

## 2023-10-30 NOTE — Telephone Encounter (Signed)
Pt notified via My Chart

## 2023-10-30 NOTE — Telephone Encounter (Signed)
Spoke to patient and advised that patient send current monitor back and request a new monitor for sensitive skin. Pt verbalized understanding. Pt advised to also use cortisone cream on affected area until skin heals. Pt had no other questions or concerns at this time.

## 2023-10-30 NOTE — Telephone Encounter (Signed)
Left a message for patient to call office back regarding strips.

## 2023-10-30 NOTE — Telephone Encounter (Signed)
Patient called [employer] and was told that they cannot send sensitive skin strips. Patient is concerned about wearing monitor for another week with rash.   Please advise.

## 2023-10-30 NOTE — Telephone Encounter (Signed)
Pt is calling back to speak to a nurse about monitor

## 2023-11-02 DIAGNOSIS — M1710 Unilateral primary osteoarthritis, unspecified knee: Secondary | ICD-10-CM | POA: Diagnosis not present

## 2023-11-02 DIAGNOSIS — L639 Alopecia areata, unspecified: Secondary | ICD-10-CM | POA: Diagnosis not present

## 2023-11-02 DIAGNOSIS — R1084 Generalized abdominal pain: Secondary | ICD-10-CM | POA: Diagnosis not present

## 2023-11-02 DIAGNOSIS — R5383 Other fatigue: Secondary | ICD-10-CM | POA: Diagnosis not present

## 2023-11-02 DIAGNOSIS — G43111 Migraine with aura, intractable, with status migrainosus: Secondary | ICD-10-CM | POA: Diagnosis not present

## 2023-11-02 DIAGNOSIS — Z6823 Body mass index (BMI) 23.0-23.9, adult: Secondary | ICD-10-CM | POA: Diagnosis not present

## 2023-11-09 DIAGNOSIS — R109 Unspecified abdominal pain: Secondary | ICD-10-CM | POA: Diagnosis not present

## 2023-11-09 DIAGNOSIS — D251 Intramural leiomyoma of uterus: Secondary | ICD-10-CM | POA: Diagnosis not present

## 2023-11-09 DIAGNOSIS — R102 Pelvic and perineal pain: Secondary | ICD-10-CM | POA: Diagnosis not present

## 2023-11-09 DIAGNOSIS — R946 Abnormal results of thyroid function studies: Secondary | ICD-10-CM | POA: Diagnosis not present

## 2023-11-09 DIAGNOSIS — E042 Nontoxic multinodular goiter: Secondary | ICD-10-CM | POA: Diagnosis not present

## 2023-11-09 DIAGNOSIS — Z9049 Acquired absence of other specified parts of digestive tract: Secondary | ICD-10-CM | POA: Diagnosis not present

## 2023-11-09 DIAGNOSIS — N83202 Unspecified ovarian cyst, left side: Secondary | ICD-10-CM | POA: Diagnosis not present

## 2023-11-14 DIAGNOSIS — Z419 Encounter for procedure for purposes other than remedying health state, unspecified: Secondary | ICD-10-CM | POA: Diagnosis not present

## 2023-11-20 ENCOUNTER — Other Ambulatory Visit (HOSPITAL_COMMUNITY): Payer: Self-pay | Admitting: Internal Medicine

## 2023-11-20 DIAGNOSIS — E041 Nontoxic single thyroid nodule: Secondary | ICD-10-CM

## 2023-11-30 ENCOUNTER — Encounter (HOSPITAL_COMMUNITY): Payer: Self-pay

## 2023-11-30 ENCOUNTER — Ambulatory Visit (HOSPITAL_COMMUNITY)
Admission: RE | Admit: 2023-11-30 | Discharge: 2023-11-30 | Disposition: A | Discharge status: Home / Self Care | Source: Ambulatory Visit | Attending: Internal Medicine | Admitting: Internal Medicine

## 2023-11-30 DIAGNOSIS — E041 Nontoxic single thyroid nodule: Secondary | ICD-10-CM | POA: Insufficient documentation

## 2023-12-04 ENCOUNTER — Ambulatory Visit: Payer: Medicaid Other | Admitting: Adult Health

## 2023-12-23 DIAGNOSIS — M5416 Radiculopathy, lumbar region: Secondary | ICD-10-CM | POA: Diagnosis not present

## 2023-12-26 DIAGNOSIS — Z419 Encounter for procedure for purposes other than remedying health state, unspecified: Secondary | ICD-10-CM | POA: Diagnosis not present

## 2024-01-25 DIAGNOSIS — Z419 Encounter for procedure for purposes other than remedying health state, unspecified: Secondary | ICD-10-CM | POA: Diagnosis not present

## 2024-02-01 DIAGNOSIS — G43111 Migraine with aura, intractable, with status migrainosus: Secondary | ICD-10-CM | POA: Diagnosis not present

## 2024-02-01 DIAGNOSIS — Z6823 Body mass index (BMI) 23.0-23.9, adult: Secondary | ICD-10-CM | POA: Diagnosis not present

## 2024-02-09 ENCOUNTER — Other Ambulatory Visit: Payer: Self-pay | Admitting: Adult Health

## 2024-02-25 DIAGNOSIS — Z419 Encounter for procedure for purposes other than remedying health state, unspecified: Secondary | ICD-10-CM | POA: Diagnosis not present

## 2024-03-26 DIAGNOSIS — Z419 Encounter for procedure for purposes other than remedying health state, unspecified: Secondary | ICD-10-CM | POA: Diagnosis not present

## 2024-03-30 DIAGNOSIS — I83893 Varicose veins of bilateral lower extremities with other complications: Secondary | ICD-10-CM | POA: Diagnosis not present

## 2024-03-30 DIAGNOSIS — Z6824 Body mass index (BMI) 24.0-24.9, adult: Secondary | ICD-10-CM | POA: Diagnosis not present

## 2024-03-30 DIAGNOSIS — J0191 Acute recurrent sinusitis, unspecified: Secondary | ICD-10-CM | POA: Diagnosis not present

## 2024-04-26 DIAGNOSIS — Z419 Encounter for procedure for purposes other than remedying health state, unspecified: Secondary | ICD-10-CM | POA: Diagnosis not present

## 2024-05-27 DIAGNOSIS — Z419 Encounter for procedure for purposes other than remedying health state, unspecified: Secondary | ICD-10-CM | POA: Diagnosis not present

## 2024-05-30 ENCOUNTER — Other Ambulatory Visit: Payer: Self-pay

## 2024-05-30 DIAGNOSIS — I8311 Varicose veins of right lower extremity with inflammation: Secondary | ICD-10-CM

## 2024-06-28 ENCOUNTER — Ambulatory Visit (HOSPITAL_COMMUNITY)
Admission: RE | Admit: 2024-06-28 | Discharge: 2024-06-28 | Disposition: A | Discharge status: Home / Self Care | Source: Ambulatory Visit | Attending: Vascular Surgery | Admitting: Vascular Surgery

## 2024-06-28 ENCOUNTER — Ambulatory Visit (INDEPENDENT_AMBULATORY_CARE_PROVIDER_SITE_OTHER): Admitting: Physician Assistant

## 2024-06-28 VITALS — BP 119/72 | Temp 98.3°F | Resp 18 | Ht 64.0 in | Wt 149.3 lb

## 2024-06-28 DIAGNOSIS — I8311 Varicose veins of right lower extremity with inflammation: Secondary | ICD-10-CM | POA: Diagnosis not present

## 2024-06-28 DIAGNOSIS — I8312 Varicose veins of left lower extremity with inflammation: Secondary | ICD-10-CM | POA: Insufficient documentation

## 2024-06-28 DIAGNOSIS — I872 Venous insufficiency (chronic) (peripheral): Secondary | ICD-10-CM | POA: Insufficient documentation

## 2024-06-28 DIAGNOSIS — I8393 Asymptomatic varicose veins of bilateral lower extremities: Secondary | ICD-10-CM

## 2024-06-28 NOTE — Progress Notes (Signed)
 Office Note     CC:  follow up Requesting Provider:  Orpha Yancey LABOR, MD  HPI: Amber Russell is a 53 y.o. (May 29, 1971) female who presents for evaluation of pain and swelling of both legs left worse than the right.  She has worn compression in the past however does not wear them regularly.  She elevates her legs when possible during the day.  She denies any history of DVT, venous ulcerations, trauma, or prior vascular interventions.  She also complains of spider veins around the medial left ankle and lateral left distal thigh.  She denies tobacco use.   Past Medical History:  Diagnosis Date   Contraceptive management 02/15/2015   GERD without esophagitis    Hep C w/o coma, chronic (HCC)    Hx of migraines    Nexplanon removal 02/15/2015    Past Surgical History:  Procedure Laterality Date   BILE DUCT EXPLORATION N/A    CESAREAN SECTION     CESAREAN SECTION     age 57   CHOLECYSTECTOMY     COLONOSCOPY WITH PROPOFOL  N/A 07/03/2021   Procedure: COLONOSCOPY WITH PROPOFOL ;  Surgeon: Eartha Angelia Sieving, MD;  Location: [facility];  Service: Gastroenterology;  Laterality: N/A;  10:05 AM   KNEE ARTHROPLASTY     POLYPECTOMY  07/03/2021   Procedure: POLYPECTOMY INTESTINAL;  Surgeon: Eartha Angelia, Sieving, MD;  Location: [facility];  Service: Gastroenterology;;    Social History   Socioeconomic History   Marital status: Married    Spouse name: Not on file   Number of children: Not on file   Years of education: Not on file   Highest education level: Not on file  Occupational History   Not on file  Tobacco Use   Smoking status: Never   Smokeless tobacco: Never  Vaping Use   Vaping status: Never Used  Substance and Sexual Activity   Alcohol use: Never   Drug use: Never   Sexual activity: Yes    Birth control/protection: None, Other-see comments    Comment: husband had prostate sugery   Other Topics Concern   Not on file  Social History Narrative   **  Merged History Encounter **       Social Drivers of Health   Financial Resource Strain: Low Risk  (05/28/2023)   Overall Financial Resource Strain (CARDIA)    Difficulty of Paying Living Expenses: Not very hard  Food Insecurity: No Food Insecurity (05/28/2023)   Hunger Vital Sign    Worried About Running Out of Food in the Last Year: Never true    Ran Out of Food in the Last Year: Never true  Transportation Needs: No Transportation Needs (05/28/2023)   PRAPARE - Administrator, Civil Service (Medical): No    Lack of Transportation (Non-Medical): No  Physical Activity: Sufficiently Active (05/28/2023)   Exercise Vital Sign    Days of Exercise per Week: 7 days    Minutes of Exercise per Session: 40 min  Stress: No Stress Concern Present (05/28/2023)   [employer] of Occupational Health - Occupational Stress Questionnaire    Feeling of Stress : Only a little  Social Connections: Moderately Integrated (05/28/2023)   Social Connection and Isolation Panel    Frequency of Communication with Friends and Family: More than three times a week    Frequency of Social Gatherings with Friends and Family: Once a week    Attends Religious Services: 1 to 4 times per year  Active Member of Clubs or Organizations: No    Attends Banker Meetings: Never    Marital Status: Married  Catering manager Violence: Not At Risk (05/28/2023)   Humiliation, Afraid, Rape, and Kick questionnaire    Fear of Current or Ex-Partner: No    Emotionally Abused: No    Physically Abused: No    Sexually Abused: No    Family History  Problem Relation Age of Onset   Allergic rhinitis Son    Asthma Son     Current Outpatient Medications  Medication Sig Dispense Refill   AIMOVIG 70 MG/ML SOAJ SMARTSIG:70 Milligram(s) SUB-Q Once a Month     Conj Estrogens-Bazedoxifene (DUAVEE ) 0.45-20 MG TABS Take 1 tablet by mouth daily. 30 tablet 6   hydrocortisone  (ANUSOL -HC) 2.5 % rectal cream Apply bid  prn to rectal area 30 g 0   NURTEC 75 MG TBDP Take 75 mg by mouth daily as needed (migraines).     omeprazole  (PRILOSEC) 20 MG capsule Take 20 mg by mouth daily as needed (acid reflux).     topiramate (TOPAMAX) 100 MG tablet Take 100 mg by mouth 2 (two) times daily.     traMADol  (ULTRAM ) 50 MG tablet Take 1 tablet (50 mg total) by mouth every 6 (six) hours as needed. 15 tablet 0   dicyclomine (BENTYL) 20 MG tablet Take by mouth.     Current Facility-Administered Medications  Medication Dose Route Frequency Provider Last Rate Last Admin   predniSONE  (DELTASONE ) tablet 10 mg  10 mg Oral UD Bobbitt, Elgin Pepper, MD        No Known Allergies   REVIEW OF SYSTEMS:  Negative unless noted in HPI [X]  denotes positive finding, [ ]  denotes negative finding Cardiac  Comments:  Chest pain or chest pressure:    Shortness of breath upon exertion:    Short of breath when lying flat:    Irregular heart rhythm:        Vascular    Pain in calf, thigh, or hip brought on by ambulation:    Pain in feet at night that wakes you up from your sleep:     Blood clot in your veins:    Leg swelling:         Pulmonary    Oxygen at home:    Productive cough:     Wheezing:         Neurologic    Sudden weakness in arms or legs:     Sudden numbness in arms or legs:     Sudden onset of difficulty speaking or slurred speech:    Temporary loss of vision in one eye:     Problems with dizziness:         Gastrointestinal    Blood in stool:     Vomited blood:         Genitourinary    Burning when urinating:     Blood in urine:        Psychiatric    Major depression:         Hematologic    Bleeding problems:    Problems with blood clotting too easily:        Skin    Rashes or ulcers:        Constitutional    Fever or chills:      PHYSICAL EXAMINATION:  Vitals:   06/28/24 1233  BP: 119/72  Resp: 18  Temp: 98.3 F (36.8 C)  TempSrc: Temporal  Weight: 149  lb 4.8 oz (67.7 kg)  Height: 5'  4 (1.626 m)    General:  WDWN in NAD; vital signs documented above Gait: Not observed HENT: WNL, normocephalic Pulmonary: normal non-labored breathing Cardiac: regular HR Abdomen: soft, NT, no masses Skin: without rashes Vascular Exam/Pulses: palpable DP pulses Extremities: without ischemic changes, without Gangrene , without cellulitis; without open wounds; no open wounds; spider veins left medial ankle and lateral distal thigh Musculoskeletal: no muscle wasting or atrophy  Neurologic: A&O X 3 Psychiatric:  The pt has Normal affect.   Non-Invasive Vascular Imaging:   Left lower extremity venous reflux study negative for DVT Incompetent common femoral vein and femoral vein Incompetent GSV at the saphenofemoral junction and proximal thigh however GSV is small in diameter throughout the thigh   ASSESSMENT/PLAN:: 53 y.o. female here for follow up for evaluation of edema left worse than right lower extremity  Amber Russell was referred to clinic for evaluation of bilateral lower extremity pain and swelling left worse than the right.  On exam she has easily palpable DP pulses ruling out arterial insufficiency.  I do not see any significant edema on the left compared to the right leg.  She has no history of DVT or venous ulcerations.  Left lower extremity reflux study does demonstrate some deep venous reflux.  Recommendations include regular use of 15 to 20 mmHg compression socks.  We also discussed proper leg elevation which should be performed periodically during the day.  She can also avoid prolonged sitting and standing and increase her activity level to help with venous return.  Venous insufficiency would not explain her symptoms of pain that radiates up and down both legs.  She will be referred back to her PCP for further investigation.  She was measured for and prescribed compression socks today.  She was also evaluated by sclerotherapy nurse Amber Russell and may consider sclerotherapy  in the future.  For now she can follow-up on an as-needed basis.   Amber Sender, PA-C [facility] [phone]  Clinic MD:   Magda

## 2024-07-08 ENCOUNTER — Ambulatory Visit (INDEPENDENT_AMBULATORY_CARE_PROVIDER_SITE_OTHER): Admitting: Adult Health

## 2024-07-08 ENCOUNTER — Encounter: Payer: Self-pay | Admitting: Adult Health

## 2024-07-08 VITALS — BP 103/71 | HR 64 | Ht 64.0 in | Wt 149.0 lb

## 2024-07-08 DIAGNOSIS — Z1231 Encounter for screening mammogram for malignant neoplasm of breast: Secondary | ICD-10-CM | POA: Diagnosis not present

## 2024-07-08 DIAGNOSIS — Z01419 Encounter for gynecological examination (general) (routine) without abnormal findings: Secondary | ICD-10-CM | POA: Diagnosis not present

## 2024-07-08 DIAGNOSIS — N83202 Unspecified ovarian cyst, left side: Secondary | ICD-10-CM

## 2024-07-08 DIAGNOSIS — K649 Unspecified hemorrhoids: Secondary | ICD-10-CM

## 2024-07-08 DIAGNOSIS — L290 Pruritus ani: Secondary | ICD-10-CM

## 2024-07-08 DIAGNOSIS — R232 Flushing: Secondary | ICD-10-CM | POA: Diagnosis not present

## 2024-07-08 DIAGNOSIS — Z1331 Encounter for screening for depression: Secondary | ICD-10-CM

## 2024-07-08 DIAGNOSIS — Z79890 Hormone replacement therapy (postmenopausal): Secondary | ICD-10-CM

## 2024-07-08 MED ORDER — DUAVEE 0.45-20 MG PO TABS: 1.0000 | ORAL_TABLET | Freq: Every day | ORAL | 12 refills | Status: DC

## 2024-07-08 MED ORDER — HYDROCORTISONE (PERIANAL) 2.5 % EX CREA: TOPICAL_CREAM | Freq: Two times a day (BID) | CUTANEOUS | 1 refills | Status: AC

## 2024-07-08 NOTE — Progress Notes (Signed)
 Patient ID: Amber Russell, female   DOB: 07-Jun-1971, 53 y.o.   MRN: [id number] History of Present Illness: Amber Russell is a 53 year old female, married, PM in for a well woman gyn exam. She had CT in November in 2024 and still has left ovary cyst, will get US  scheduled for follow up. She is happy with Duavee , hot flashes much better. Husband is having surgery next week.     Component Value Date/Time   DIAGPAP  05/04/2023 1039    - Negative for intraepithelial lesion or malignancy (NILM)   DIAGPAP  05/30/2019 0000    NEGATIVE FOR INTRAEPITHELIAL LESIONS OR MALIGNANCY.   HPVHIGH Negative 05/04/2023 1039   ADEQPAP  05/04/2023 1039    Satisfactory for evaluation; transformation zone component PRESENT.   ADEQPAP  05/30/2019 0000    Satisfactory for evaluation  endocervical/transformation zone component PRESENT.   PCP is  Dr Orpha   Current Medications, Allergies, Past Medical History, Past Surgical History, Family History and Social History were reviewed in [employer] electronic medical record.     Review of Systems: Patient denies any daily headaches,(occasional migraine) hearing loss, fatigue, blurred vision, shortness of breath, chest pain, abdominal pain, problems with bowel movements, urination, or intercourse. No joint pain or mood swings. Has some rectal itching at times.  Hot flashes better Denies any vaginal bleeding    Physical Exam:BP 103/71 (BP Location: Right Arm, Patient Position: Sitting, Cuff Size: Normal)   Pulse 64   Ht 5' 4 (1.626 m)   Wt 149 lb (67.6 kg)   LMP 03/09/2023   BMI 25.58 kg/m   General:  Well developed, well nourished, no acute distress Skin:  Warm and dry Neck:  Midline trachea, normal thyroid , good ROM, no lymphadenopathy Lungs; Clear to auscultation bilaterally Breast:  No dominant palpable mass, retraction, or nipple discharge Cardiovascular: Regular rate and rhythm Abdomen:  Soft, non tender, no hepatosplenomegaly Pelvic:  External  genitalia is normal in appearance, no lesions.  The vagina is normal in appearance. Urethra has no lesions or masses. The cervix is smooth.  Uterus is felt to be normal size, shape, and contour.  No adnexal masses or tenderness noted.Bladder is non tender, no masses felt. Rectal: Deferred, has small external hemorrhoid Extremities/musculoskeletal:  No swelling or varicosities noted, no clubbing or cyanosis Psych:  No mood changes, alert and cooperative,seems happy AA is 0 Fall risk is low    07/08/2024    8:40 AM 05/28/2023    1:24 PM 06/10/2021    2:08 PM  Depression screen PHQ 2/9  Decreased Interest 0 0 0  Down, Depressed, Hopeless 0 0 0  PHQ - 2 Score 0 0 0  Altered sleeping 0 0 0  Tired, decreased energy 0 1 1  Change in appetite 0 0 0  Feeling bad or failure about yourself  0 0 0  Trouble concentrating 0 0 0  Moving slowly or fidgety/restless 0 0 0  Suicidal thoughts 0 0 0  PHQ-9 Score 0 1 1       07/08/2024    8:40 AM 05/28/2023    1:24 PM 06/10/2021    2:12 PM 06/04/2020   10:59 AM  GAD 7 : Generalized Anxiety Score  Nervous, Anxious, on Edge 0 1 0 0  Control/stop worrying 0 1 0 0  Worry too much - different things 0 0 0 0  Trouble relaxing 0 0 0 0  Restless 0 0 0 0  Easily annoyed or irritable 0 0  0 0  Afraid - awful might happen 0 0 0 0  Total GAD 7 Score 0 2 0 0      Upstream - 07/08/24 0840       Pregnancy Intention Screening   Does the patient want to become pregnant in the next year? N/A    Does the patient's partner want to become pregnant in the next year? N/A    Would the patient like to discuss contraceptive options today? N/A      Contraception Wrap Up   Current Method No Method - Other Reason    Reason for No Current Contraceptive Method at Intake ([facility] Only) Other   PM   End Method No Method - Other Reason   PM   Contraception Counseling Provided No         Examination chaperoned by Tish RN   Impression and plan: 1. Encounter for well woman  exam with routine gynecological exam (Primary) Pap in 2027 Physical in 1 year Colonoscopy per GI Labs with PCP  2. Cyst of left ovary Will get US  in office in about 3 weeks for follow up  - US  PELVIC COMPLETE WITH TRANSVAGINAL; Future  3. Hot flashes Better with Duavee , just occasionally now  4. Hormone replacement therapy (HRT) Refilled Duavee  Meds ordered this encounter  Medications   Conj Estrogens-Bazedoxifene (DUAVEE ) 0.45-20 MG TABS    Sig: Take 1 tablet by mouth daily.    Dispense:  30 tablet    Refill:  12    Supervising Provider:   JAYNE MINDER H [[id number]]   hydrocortisone  (ANUSOL -HC) 2.5 % rectal cream    Sig: Place rectally 2 (two) times daily.    Dispense:  30 g    Refill:  1    Supervising Provider:   JAYNE, LUTHER H [[id number]]     5. Hemorrhoids, unspecified hemorrhoid type Refilled anusol  HC cream  6. Rectal itching Try Tucks pads   7. Screening mammogram for breast cancer Scheduled for her for 07/20/24 at 8:15 am at [facility]  - MM 3D SCREENING MAMMOGRAM BILATERAL BREAST; Future

## 2024-07-20 ENCOUNTER — Ambulatory Visit (HOSPITAL_COMMUNITY)
Admission: RE | Admit: 2024-07-20 | Discharge: 2024-07-20 | Disposition: A | Discharge status: Home / Self Care | Source: Ambulatory Visit | Attending: Adult Health | Admitting: Adult Health

## 2024-07-20 ENCOUNTER — Encounter (HOSPITAL_COMMUNITY): Payer: Self-pay

## 2024-07-20 DIAGNOSIS — Z1231 Encounter for screening mammogram for malignant neoplasm of breast: Secondary | ICD-10-CM | POA: Diagnosis not present

## 2024-07-22 ENCOUNTER — Ambulatory Visit: Payer: Self-pay | Admitting: Adult Health

## 2024-07-27 DIAGNOSIS — Z419 Encounter for procedure for purposes other than remedying health state, unspecified: Secondary | ICD-10-CM | POA: Diagnosis not present

## 2024-08-01 ENCOUNTER — Ambulatory Visit

## 2024-08-01 DIAGNOSIS — N83202 Unspecified ovarian cyst, left side: Secondary | ICD-10-CM

## 2024-08-01 NOTE — Progress Notes (Signed)
 PELVIC US  TA/TV; heterogeneous anteverted uterus with multiple fibroids (#1)anterior/mid left intramural fibroid 1.3 x 1.3 x 1.5 cm,(#2) fundal intramural fibroid 1.8 x 1.6 x 1.8 cm,normal right ovary,simple unilocular left ovarian cyst 5.7 x 5.6 x 4.1 cm,ovaries appear mobile,EEC 6.5 cm,no free fluid,no pain during ultrasound  Chaperone Peggy

## 2024-08-02 ENCOUNTER — Telehealth: Payer: Self-pay | Admitting: Adult Health

## 2024-08-02 ENCOUNTER — Ambulatory Visit: Admitting: Obstetrics & Gynecology

## 2024-08-02 ENCOUNTER — Encounter: Payer: Self-pay | Admitting: Obstetrics & Gynecology

## 2024-08-02 VITALS — BP 137/85 | HR 64 | Ht 64.0 in | Wt 152.0 lb

## 2024-08-02 DIAGNOSIS — D271 Benign neoplasm of left ovary: Secondary | ICD-10-CM

## 2024-08-02 DIAGNOSIS — D219 Benign neoplasm of connective and other soft tissue, unspecified: Secondary | ICD-10-CM

## 2024-08-02 NOTE — Progress Notes (Signed)
 Follow up appointment for results: sonogram  Chief Complaint  Patient presents with   Follow-up    US     Blood pressure 137/85, pulse 64, height 5' 4 (1.626 m), weight 152 lb (68.9 kg), last menstrual period 03/09/2023.  US  PELVIC COMPLETE WITH TRANSVAGINAL Result Date: 08/01/2024 Images from the original result were not included.  ..[facility] [employer] [facility] Ultrasound Medicine TECHNICAL SALES ENGINEER) accredited practice Center for [facility] [address] [place] [address] Ordering Provider: Signa Delon LABOR, NP                                                                                                                                   GYNECOLOGIC SONOGRAM EXAM: TRANSABDOMINAL AND TRANSVAGINAL ULTRASOUND OF PELVIS  TECHNIQUE: Transabdominal and transvaginal ultrasound examinations of the pelvis were performed. Transabdominal technique was performed for global imaging of the pelvis including uterus, ovaries, adnexal regions, and pelvic cul-de-sac. It was necessary to proceed with endovaginal exam following the transabdominal exam to better visualize and improve detailed examination of  the uterus, endometrium and bilateral ovaries, as well as, evaluation of ovarian blood flow and sliding characteristics. Amber Russell is a 54 y.o. [id number] Patient's last menstrual period was 03/09/2023. Postmenopausal,She is here for a pelvic sonogram to follow up left ovarian cyst. Uterus                      7.5 x 4.6 x 5.7 cm, Total uterine volume 104 cc, heterogeneous anteverted uterus with multiple fibroids (#1)anterior/mid left intramural fibroid 1.3 x 1.3 x 1.5 cm,(#2) fundal intramural fibroid 1.8 x 1.6 x 1.8 cm Endometrium          6.5 mm, symmetrical, thickened Right ovary             3.3 x 2 x 2.6 cm, normal Left ovary                6.2 x 4.6 x 5.8 cm, simple unilocular left ovarian cyst 5.7 x 5.6 x 4.1 cm No free fluid Technician Comments: PELVIC US  TA/TV; heterogeneous  anteverted uterus with multiple fibroids (#1)anterior/mid left intramural fibroid 1.3 x 1.3 x 1.5 cm,(#2) fundal intramural fibroid 1.8 x 1.6 x 1.8 cm,normal right ovary,simple unilocular left ovarian cyst 5.7 x 5.6 x 4.1 cm,ovaries appear mobile,thickened endometrium,EEC 6.5 cm,no free fluid,no pain during ultrasound Chaperone [address] JINNY Pitts 08/01/2024 12:29 PM Clinical Impression and recommendations: Comparison study 05/11/2023 I have reviewed the sonogram results above, combined with the patient's current clinical course, below are my impressions and any appropriate recommendations for management based on the sonographic findings. Uterus overall normal size shape and contour, small clinically insignificant fibroids Endometrium normal width and appearance, postmenopausal woman but no bleeding Ovaries: right ovary is normal size and morphology Left ovarian cyst is larger in diameter by 25% in a menopausal woman, ROMA testing last year was normal but with  continued growth in a post menopausal woman likely to be a benign ovarian neoplasm, consider removal given these growth changes [name] 08/01/2024 5:18 PM        MEDS ordered this encounter: No orders of the defined types were placed in this encounter.   Orders for this encounter: Orders Placed This Encounter  Procedures   Ambulatory Referral For Surgery Scheduling  Plan for RA TLH + BSO  Impression + Management Plan   ICD-10-CM   1. 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 Fibroids  D21.9 Ambulatory Referral For Surgery Scheduling      Follow Up: Return in about 2 months (around 10/07/2024).     All questions were answered.  Past Medical History:  Diagnosis Date   GERD without esophagitis    Hep C w/o coma, chronic (HCC)    Hx of migraines     Past Surgical History:  Procedure Laterality Date   BILE DUCT EXPLORATION N/A    CESAREAN  SECTION     CESAREAN SECTION     age 76   CHOLECYSTECTOMY     COLONOSCOPY WITH PROPOFOL  N/A 07/03/2021   Procedure: COLONOSCOPY WITH PROPOFOL ;  Surgeon: Eartha Angelia Sieving, MD;  Location: [facility];  Service: Gastroenterology;  Laterality: N/A;  10:05 AM   KNEE ARTHROPLASTY     POLYPECTOMY  07/03/2021   Procedure: POLYPECTOMY INTESTINAL;  Surgeon: Eartha Angelia, Sieving, MD;  Location: [facility];  Service: Gastroenterology;;    OB History     Gravida  5   Para  2   Term  2   Preterm  0   AB  3   Living         SAB  1   IAB  0   Ectopic  0   Multiple  1   Live Births              No Known Allergies  Social History   Socioeconomic History   Marital status: Married    Spouse name: Not on file   Number of children: Not on file   Years of education: Not on file   Highest education level: Not on file  Occupational History   Not on file  Tobacco Use   Smoking status: Never   Smokeless tobacco: Never  Vaping Use   Vaping status: Never Used  Substance and Sexual Activity   Alcohol use: Never   Drug use: Never   Sexual activity: Yes    Birth control/protection: None, Other-see comments    Comment: husband had prostate sugery   Other Topics Concern   Not on file  Social History Narrative   ** Merged History Encounter **       Social Drivers of Health   Financial Resource Strain: Low Risk  (07/08/2024)   Overall Financial Resource Strain (CARDIA)    Difficulty of Paying Living Expenses: Not very hard  Food Insecurity: No Food Insecurity (07/08/2024)   Hunger Vital Sign    Worried About Running Out of Food in the Last Year: Never true    Ran Out of Food in the Last Year: Never true  Transportation Needs: No Transportation Needs (07/08/2024)   PRAPARE - Administrator, Civil Service (Medical): No    Lack of Transportation (Non-Medical): No  Physical Activity: Sufficiently Active (07/08/2024)   Exercise Vital Sign     Days of Exercise  per Week: 7 days    Minutes of Exercise per Session: 40 min  Stress: No Stress Concern Present (07/08/2024)   [employer] of Occupational Health - Occupational Stress Questionnaire    Feeling of Stress: Only a little  Social Connections: Moderately Integrated (07/08/2024)   Social Connection and Isolation Panel    Frequency of Communication with Friends and Family: More than three times a week    Frequency of Social Gatherings with Friends and Family: Once a week    Attends Religious Services: 1 to 4 times per year    Active Member of [employer] or Organizations: No    Attends Banker Meetings: Never    Marital Status: Married    Family History  Problem Relation Age of Onset   Allergic rhinitis Son    Asthma Son

## 2024-08-02 NOTE — Telephone Encounter (Signed)
 Pt was in office seeing Dr. Jayne. Pt said to disregard this message. Closing encounter. JSY

## 2024-08-02 NOTE — Telephone Encounter (Signed)
 Patient would like you to call her. Please advise.

## 2024-08-03 DIAGNOSIS — Z0000 Encounter for general adult medical examination without abnormal findings: Secondary | ICD-10-CM | POA: Diagnosis not present

## 2024-08-03 DIAGNOSIS — Z6825 Body mass index (BMI) 25.0-25.9, adult: Secondary | ICD-10-CM | POA: Diagnosis not present

## 2024-08-08 DIAGNOSIS — R5383 Other fatigue: Secondary | ICD-10-CM | POA: Diagnosis not present

## 2024-08-08 DIAGNOSIS — M150 Primary generalized (osteo)arthritis: Secondary | ICD-10-CM | POA: Diagnosis not present

## 2024-08-08 DIAGNOSIS — R1084 Generalized abdominal pain: Secondary | ICD-10-CM | POA: Diagnosis not present

## 2024-08-08 DIAGNOSIS — L639 Alopecia areata, unspecified: Secondary | ICD-10-CM | POA: Diagnosis not present

## 2024-08-08 DIAGNOSIS — E0540 Thyrotoxicosis factitia without thyrotoxic crisis or storm: Secondary | ICD-10-CM | POA: Diagnosis not present

## 2024-08-08 DIAGNOSIS — G43111 Migraine with aura, intractable, with status migrainosus: Secondary | ICD-10-CM | POA: Diagnosis not present

## 2024-08-10 ENCOUNTER — Encounter: Payer: Self-pay | Admitting: Obstetrics & Gynecology

## 2024-08-19 ENCOUNTER — Telehealth: Payer: Self-pay

## 2024-08-19 NOTE — Telephone Encounter (Signed)
 Patient has pain in abdomen and heavy bleeding. Asking for a call back.

## 2024-08-22 DIAGNOSIS — G43719 Chronic migraine without aura, intractable, without status migrainosus: Secondary | ICD-10-CM | POA: Diagnosis not present

## 2024-08-30 ENCOUNTER — Ambulatory Visit: Admitting: Obstetrics & Gynecology

## 2024-09-13 ENCOUNTER — Encounter: Payer: Self-pay | Admitting: Obstetrics & Gynecology

## 2024-09-20 ENCOUNTER — Telehealth: Payer: Self-pay | Admitting: Obstetrics & Gynecology

## 2024-09-20 NOTE — Telephone Encounter (Signed)
 Patient calling again wanting to speak to you in regards to her surgery. She is wanting to know if you will call her today to talk about her concerns that she wants to talk about.

## 2024-09-22 ENCOUNTER — Encounter: Payer: Self-pay | Admitting: Obstetrics & Gynecology

## 2024-09-23 ENCOUNTER — Other Ambulatory Visit: Payer: Self-pay

## 2024-09-23 ENCOUNTER — Encounter (HOSPITAL_COMMUNITY): Payer: Self-pay

## 2024-09-23 ENCOUNTER — Encounter (HOSPITAL_COMMUNITY)
Admission: RE | Admit: 2024-09-23 | Discharge: 2024-09-23 | Disposition: A | Source: Ambulatory Visit | Attending: Obstetrics & Gynecology

## 2024-09-23 ENCOUNTER — Other Ambulatory Visit: Payer: Self-pay | Admitting: Obstetrics & Gynecology

## 2024-09-23 DIAGNOSIS — Z01818 Encounter for other preprocedural examination: Secondary | ICD-10-CM

## 2024-09-23 DIAGNOSIS — Z01812 Encounter for preprocedural laboratory examination: Secondary | ICD-10-CM | POA: Insufficient documentation

## 2024-09-23 LAB — CBC
HCT: 42.3 % (ref 36.0–46.0)
Hemoglobin: 13.9 g/dL (ref 12.0–15.0)
MCH: 29.4 pg (ref 26.0–34.0)
MCHC: 32.9 g/dL (ref 30.0–36.0)
MCV: 89.4 fL (ref 80.0–100.0)
Platelets: 154 K/uL (ref 150–400)
RBC: 4.73 MIL/uL (ref 3.87–5.11)
RDW: 13.4 % (ref 11.5–15.5)
WBC: 7.1 K/uL (ref 4.0–10.5)
nRBC: 0 % (ref 0.0–0.2)

## 2024-09-23 LAB — URINALYSIS, ROUTINE W REFLEX MICROSCOPIC
Bacteria, UA: NONE SEEN
Bilirubin Urine: NEGATIVE
Glucose, UA: NEGATIVE mg/dL
Ketones, ur: NEGATIVE mg/dL
Leukocytes,Ua: NEGATIVE
Nitrite: NEGATIVE
Protein, ur: NEGATIVE mg/dL
Specific Gravity, Urine: 1.004 — ABNORMAL LOW (ref 1.005–1.030)
pH: 6 (ref 5.0–8.0)

## 2024-09-23 LAB — COMPREHENSIVE METABOLIC PANEL WITH GFR
ALT: 15 U/L (ref 0–44)
AST: 23 U/L (ref 15–41)
Albumin: 4.6 g/dL (ref 3.5–5.0)
Alkaline Phosphatase: 47 U/L (ref 38–126)
Anion gap: 9 (ref 5–15)
BUN: 16 mg/dL (ref 6–20)
CO2: 29 mmol/L (ref 22–32)
Calcium: 9.4 mg/dL (ref 8.9–10.3)
Chloride: 101 mmol/L (ref 98–111)
Creatinine, Ser: 0.57 mg/dL (ref 0.44–1.00)
GFR, Estimated: >60 mL/min
Glucose, Bld: 77 mg/dL (ref 70–99)
Potassium: 4 mmol/L (ref 3.5–5.1)
Sodium: 138 mmol/L (ref 135–145)
Total Bilirubin: 1.2 mg/dL (ref 0.0–1.2)
Total Protein: 7.3 g/dL (ref 6.5–8.1)

## 2024-09-23 LAB — RAPID HIV SCREEN (HIV 1/2 AB+AG)
HIV 1/2 Antibodies: NONREACTIVE
HIV-1 P24 Antigen - HIV24: NONREACTIVE

## 2024-09-23 LAB — TYPE AND SCREEN
ABO/RH(D): B POS
Antibody Screen: NEGATIVE

## 2024-09-23 LAB — PREGNANCY, URINE: Preg Test, Ur: NEGATIVE

## 2024-09-23 NOTE — Patient Instructions (Signed)
 "       Amber Russell  09/23/2024     @PREFPERIOPPHARMACY @   Your procedure is scheduled on 09/27/2024.   Report to [facility] at  0600  A.M.   Call this number if you have problems the morning of surgery:  [phone]  If you experience any cold or flu symptoms such as cough, fever, chills, shortness of breath, etc. between now and your scheduled surgery, please notify us  at the above number.   Remember:  Do not eat after midnight.   You may drink clear liquids until  0330 am on 09-27-2024.        Clear liquids allowed are:                    Water , Carbonated beverages (diabetics please choose diet or no sugar options), Black Coffee Only (No creamer, milk or cream, including half & half and powdered creamer), and Clear Sports drink (No red color; diabetics please choose diet or no sugar options)          At 0330 am on 09/27/2024 drink your carb drink, you can have nothing else to drink after this.   Take these medicines the morning of surgery with A SIP OF WATER                                                              none      Do not wear jewelry, make-up or nail polish, including gel polish,  artificial nails, or any other type of covering on natural nails (fingers and  toes).  Do not wear lotions, powders, or perfumes, or deodorant.  Do not shave 48 hours prior to surgery.  Men may shave face and neck.  Do not bring valuables to the hospital.  [facility] is not responsible for any belongings or valuables.  Contacts, dentures or bridgework may not be worn into surgery.  Leave your suitcase in the car.  After surgery it may be brought to your room.  For patients admitted to the hospital, discharge time will be determined by your treatment team.  Patients discharged the day of surgery will not be allowed to drive home and must have someone with them for 24 hours.    Special instructions:  DO NOT smoke tobacco or vape for 24 hours before your  procedure.  Please read over the following fact sheets that you were given. Pain Booklet, Coughing and Deep Breathing, Blood Transfusion Information, Surgical Site Infection Prevention, Anesthesia Post-op Instructions, and Care and Recovery After Surgery      Surgery to Remove Fallopian Tube and Ovary (Salpingo-Oophorectomy): What to Know After After the surgery to remove fallopian tubes and ovaries, it's common to have pain in your belly and light bleeding from your vagina (spotting). You may also feel tired. If both ovaries and both tubes were removed, you may have symptoms of menopause. Menopause is when you no longer have a period. Symptoms of menopause include: Hot flashes. Night sweats. Mood swings. Follow these instructions at home: Medicines Take your medicines only as told. If you were given antibiotics, take them as told. Do not stop taking them even if you start to feel better. You may need to take steps to help treat or prevent trouble pooping (constipation),  such as: Taking medicines to help you poop. Eating foods high in fiber, like beans, whole grains, and fresh fruits and vegetables. Drinking more fluids as told. Ask your provider if it's safe to drive or use machines while taking your medicine. Incision care  Take care of your cut from surgery as told. Make sure you: Wash your hands with soap and water  for at least 20 seconds before and after you change your bandage. If you can't use soap and water , use hand sanitizer. Change your bandage. Leave stitches, staples, or skin glue alone. Leave tape strips alone unless you're told to take them off. You may trim the edges of the tape strips if they curl up. Check the area around your cut from surgery every day for signs of infection. Check for: Redness, swelling, or pain. Fluid or blood. Warmth. Pus or a bad smell. Activity Rest as told. Get up to take short walks at least every 2 hours during the day. This helps you  breathe better and keeps your blood flowing. Ask for help if you feel weak or unsteady. Exercise as told. Do not lift anything heavier than 10 lb (4.5 kg) until you're told it's OK. Ask what things are safe for you to do at home. Ask when you can go back to work or school. General instructions Do not take baths, swim, or use a hot tub until you're told it's OK. Ask if you can shower. Wear compression stockings to reduce swelling and help prevent blood clots in your legs. Contact a health care provider if: You have pain when you pee. You have signs of infection in the cut made for surgery. You have a fever. You have a rash. You have belly pain that gets worse or does not get better with medicine. You feel light-headed. You throw up or feel like throwing up. Get help right away if: You have very bad pain in your chest or leg. You have shortness of breath. You faint. You have more bleeding or heavy bleeding from your vagina. This is bleeding that soaks 1 pad in an hour. These symptoms may be an emergency. Call 911 right away. Do not wait to see if the symptoms will go away. Do not drive yourself to the hospital. This information is not intended to replace advice given to you by your health care provider. Make sure you discuss any questions you have with your health care provider. Document Revised: 04/27/2023 Document Reviewed: 04/20/2023 Elsevier Patient Education  2024 Elsevier Inc.General Anesthesia, Adult, Care After The following information offers guidance on how to care for yourself after your procedure. Your health care provider may also give you more specific instructions. If you have problems or questions, contact your health care provider. What can I expect after the procedure? After the procedure, it is common for people to: Have pain or discomfort at the IV site. Have nausea or vomiting. Have a sore throat or hoarseness. Have trouble concentrating. Feel cold or  chills. Feel weak, sleepy, or tired (fatigue). Have soreness and body aches. These can affect parts of the body that were not involved in surgery. Follow these instructions at home: For the time period you were told by your health care provider:  Rest. Do not participate in activities where you could fall or become injured. Do not drive or use machinery. Do not drink alcohol. Do not take sleeping pills or medicines that cause drowsiness. Do not make important decisions or sign legal documents. Do not take care  of children on your own. General instructions Drink enough fluid to keep your urine pale yellow. If you have sleep apnea, surgery and certain medicines can increase your risk for breathing problems. Follow instructions from your health care provider about wearing your sleep device: Anytime you are sleeping, including during daytime naps. While taking prescription pain medicines, sleeping medicines, or medicines that make you drowsy. Return to your normal activities as told by your health care provider. Ask your health care provider what activities are safe for you. Take over-the-counter and prescription medicines only as told by your health care provider. Do not use any products that contain nicotine or tobacco. These products include cigarettes, chewing tobacco, and vaping devices, such as e-cigarettes. These can delay incision healing after surgery. If you need help quitting, ask your health care provider. Contact a health care provider if: You have nausea or vomiting that does not get better with medicine. You vomit every time you eat or drink. You have pain that does not get better with medicine. You cannot urinate or have bloody urine. You develop a skin rash. You have a fever. Get help right away if: You have trouble breathing. You have chest pain. You vomit blood. These symptoms may be an emergency. Get help right away. Call 911. Do not wait to see if the symptoms will  go away. Do not drive yourself to the hospital. Summary After the procedure, it is common to have a sore throat, hoarseness, nausea, vomiting, or to feel weak, sleepy, or fatigue. For the time period you were told by your health care provider, do not drive or use machinery. Get help right away if you have difficulty breathing, have chest pain, or vomit blood. These symptoms may be an emergency. This information is not intended to replace advice given to you by your health care provider. Make sure you discuss any questions you have with your health care provider. Document Revised: 11/29/2021 Document Reviewed: 11/29/2021 Elsevier Patient Education  2024 Elsevier Inc.How to Use Chlorhexidine  at Home in the Shower Chlorhexidine  gluconate (CHG) is a germ-killing (antiseptic) wash that's used to clean the skin. It can get rid of the germs that normally live on the skin and can keep them away for about 24 hours. If you're having surgery, you may be told to shower with CHG at home the night before surgery. This can help lower your risk for infection. To use CHG wash in the shower, follow the steps below. Supplies needed: CHG body wash. Clean washcloth. Clean towel. How to use CHG in the shower Follow these steps unless you're told to use CHG in a different way: Start the shower. Use your normal soap and shampoo to wash your face and hair. Turn off the shower or move out of the shower stream. Pour CHG onto a clean washcloth. Do not use any type of brush or rough sponge. Start at your neck, washing your body down to your toes. Make sure you: Wash the part of your body where the surgery will be done for at least 1 minute. Do not scrub. Do not use CHG on your head or face unless your health care provider tells you to. If it gets into your ears or eyes, rinse them well with water . Do not wash your genitals with CHG. Wash your back and under your arms. Make sure to wash skin folds. Let the CHG sit on  your skin for 1-2 minutes or as long as told. Rinse your entire body in the  shower, including all body creases and folds. Turn off the shower. Dry off with a clean towel. Do not put anything on your skin afterward, such as powder, lotion, or perfume. Put on clean clothes or pajamas. If it's the night before surgery, sleep in clean sheets. General tips Use CHG only as told, and follow the instructions on the label. Use the full amount of CHG as told. This is often one bottle. Do not smoke and stay away from flames after using CHG. Your skin may feel sticky after using CHG. This is normal. The sticky feeling will go away as the CHG dries. Do not use CHG: If you have a chlorhexidine  allergy or have reacted to chlorhexidine  in the past. On open wounds or areas of skin that have broken skin, cuts, or scrapes. On babies younger than 63 months of age. Contact a health care provider if: You have questions about using CHG. Your skin gets irritated or itchy. You have a rash after using CHG. You swallow any CHG. Call your local poison control center [phone] in the [place] Your eyes itch badly, or they become very red or swollen. Your hearing changes. You have trouble seeing. If you can't reach your provider, go to an urgent care or emergency room. Do not drive yourself. Get help right away if: You have swelling or tingling in your mouth or throat. You make high-pitched whistling sounds when you breathe, most often when you breathe out (wheeze). You have trouble breathing. These symptoms may be an emergency. Call 911 right away. Do not wait to see if the symptoms will go away. Do not drive yourself to the hospital. This information is not intended to replace advice given to you by your health care provider. Make sure you discuss any questions you have with your health care provider. Document Revised: 03/17/2023 Document Reviewed: 03/13/2022 Elsevier Patient Education  2024 [employer]. "

## 2024-09-26 ENCOUNTER — Ambulatory Visit: Admitting: Obstetrics & Gynecology

## 2024-09-27 ENCOUNTER — Ambulatory Visit (HOSPITAL_COMMUNITY)
Admission: RE | Admit: 2024-09-27 | Discharge: 2024-09-27 | Disposition: A | Discharge status: Home / Self Care | Attending: Obstetrics & Gynecology | Admitting: Obstetrics & Gynecology

## 2024-09-27 ENCOUNTER — Encounter (HOSPITAL_COMMUNITY): Payer: Self-pay | Admitting: Obstetrics & Gynecology

## 2024-09-27 ENCOUNTER — Encounter (HOSPITAL_COMMUNITY): Admission: RE | Disposition: A | Payer: Self-pay | Source: Home / Self Care | Attending: Obstetrics & Gynecology

## 2024-09-27 ENCOUNTER — Other Ambulatory Visit: Payer: Self-pay

## 2024-09-27 ENCOUNTER — Other Ambulatory Visit: Payer: Self-pay | Admitting: Obstetrics & Gynecology

## 2024-09-27 ENCOUNTER — Encounter (HOSPITAL_COMMUNITY): Payer: Self-pay | Admitting: Anesthesiology

## 2024-09-27 ENCOUNTER — Ambulatory Visit (HOSPITAL_COMMUNITY): Payer: Self-pay | Admitting: Anesthesiology

## 2024-09-27 DIAGNOSIS — J45909 Unspecified asthma, uncomplicated: Secondary | ICD-10-CM | POA: Diagnosis not present

## 2024-09-27 DIAGNOSIS — R1032 Left lower quadrant pain: Secondary | ICD-10-CM

## 2024-09-27 DIAGNOSIS — D271 Benign neoplasm of left ovary: Secondary | ICD-10-CM | POA: Diagnosis present

## 2024-09-27 DIAGNOSIS — N83292 Other ovarian cyst, left side: Secondary | ICD-10-CM

## 2024-09-27 DIAGNOSIS — D219 Benign neoplasm of connective and other soft tissue, unspecified: Secondary | ICD-10-CM | POA: Diagnosis present

## 2024-09-27 DIAGNOSIS — K219 Gastro-esophageal reflux disease without esophagitis: Secondary | ICD-10-CM | POA: Insufficient documentation

## 2024-09-27 DIAGNOSIS — I10 Essential (primary) hypertension: Secondary | ICD-10-CM | POA: Diagnosis not present

## 2024-09-27 DIAGNOSIS — B182 Chronic viral hepatitis C: Secondary | ICD-10-CM | POA: Insufficient documentation

## 2024-09-27 HISTORY — PX: XI ROBOTIC ASSISTED SALPINGECTOMY: SHX6824

## 2024-09-27 HISTORY — PX: XI ROBOTIC ASSISTED OOPHORECTOMY: SHX6823

## 2024-09-27 MED ORDER — POVIDONE-IODINE 10 % EX SWAB
2.0000 | Freq: Once | CUTANEOUS | Status: AC
  Administered 2024-09-27: 2 via TOPICAL

## 2024-09-27 MED ORDER — STERILE WATER FOR IRRIGATION IR SOLN
Status: DC | PRN
  Administered 2024-09-27: 1000 mL

## 2024-09-27 MED ORDER — BUPIVACAINE HCL (PF) 0.25 % IJ SOLN
INTRAMUSCULAR | Status: DC | PRN
  Administered 2024-09-27: 40 mL

## 2024-09-27 MED ORDER — LIDOCAINE 2% (20 MG/ML) 5 ML SYRINGE
INTRAMUSCULAR | Status: AC
  Filled 2024-09-27: qty 5

## 2024-09-27 MED ORDER — LIDOCAINE 2% (20 MG/ML) 5 ML SYRINGE
INTRAMUSCULAR | Status: DC | PRN
  Administered 2024-09-27: 60 mg via INTRAVENOUS

## 2024-09-27 MED ORDER — ROCURONIUM BROMIDE 10 MG/ML (PF) SYRINGE
PREFILLED_SYRINGE | INTRAVENOUS | Status: AC
  Filled 2024-09-27: qty 10

## 2024-09-27 MED ORDER — ONDANSETRON HCL 4 MG/2ML IJ SOLN
INTRAMUSCULAR | Status: AC
  Filled 2024-09-27: qty 2

## 2024-09-27 MED ORDER — ONDANSETRON HCL 4 MG/2ML IJ SOLN: 4.0000 mg | Freq: Once | INTRAMUSCULAR | Status: DC | PRN

## 2024-09-27 MED ORDER — ATROPINE SULFATE (PF) 0.4 MG/ML IJ SOLN
INTRAMUSCULAR | Status: AC
  Filled 2024-09-27: qty 1

## 2024-09-27 MED ORDER — MIDAZOLAM HCL 2 MG/2ML IJ SOLN
INTRAMUSCULAR | Status: AC
  Filled 2024-09-27: qty 2

## 2024-09-27 MED ORDER — BUPIVACAINE HCL (PF) 0.25 % IJ SOLN
INTRAMUSCULAR | Status: AC
  Filled 2024-09-27: qty 60

## 2024-09-27 MED ORDER — DEXAMETHASONE SOD PHOSPHATE PF 10 MG/ML IJ SOLN
INTRAMUSCULAR | Status: DC | PRN
  Administered 2024-09-27: 8 mg via INTRAVENOUS

## 2024-09-27 MED ORDER — OXYCODONE HCL 5 MG PO TABS
5.0000 mg | ORAL_TABLET | Freq: Once | ORAL | Status: AC | PRN
  Administered 2024-09-27: 5 mg via ORAL
  Filled 2024-09-27: qty 1

## 2024-09-27 MED ORDER — MIDAZOLAM HCL 5 MG/5ML IJ SOLN
INTRAMUSCULAR | Status: DC | PRN
  Administered 2024-09-27 (×2): 1 mg via INTRAVENOUS

## 2024-09-27 MED ORDER — OXYCODONE HCL 5 MG/5ML PO SOLN: 5.0000 mg | Freq: Once | ORAL | Status: AC | PRN

## 2024-09-27 MED ORDER — FENTANYL CITRATE (PF) 100 MCG/2ML IJ SOLN
INTRAMUSCULAR | Status: AC
  Filled 2024-09-27: qty 2

## 2024-09-27 MED ORDER — CEFAZOLIN SODIUM-DEXTROSE 2-4 GM/100ML-% IV SOLN
2.0000 g | INTRAVENOUS | Status: AC
  Administered 2024-09-27: 2 g via INTRAVENOUS
  Filled 2024-09-27: qty 100

## 2024-09-27 MED ORDER — DEXAMETHASONE SOD PHOSPHATE PF 10 MG/ML IJ SOLN
INTRAMUSCULAR | Status: AC
  Filled 2024-09-27: qty 1

## 2024-09-27 MED ORDER — PROPOFOL 10 MG/ML IV BOLUS
INTRAVENOUS | Status: DC | PRN
  Administered 2024-09-27: 150 mg via INTRAVENOUS

## 2024-09-27 MED ORDER — ROCURONIUM 10MG/ML (10ML) SYRINGE FOR MEDFUSION PUMP - OPTIME
INTRAVENOUS | Status: DC | PRN
  Administered 2024-09-27: 45 mg via INTRAVENOUS

## 2024-09-27 MED ORDER — KETOROLAC TROMETHAMINE 10 MG PO TABS: 10.0000 mg | ORAL_TABLET | Freq: Three times a day (TID) | ORAL | 0 refills | Status: DC | PRN

## 2024-09-27 MED ORDER — PROPOFOL 500 MG/50ML IV EMUL: INTRAVENOUS | Status: DC | PRN

## 2024-09-27 MED ORDER — PROPOFOL 10 MG/ML IV BOLUS
INTRAVENOUS | Status: AC
  Filled 2024-09-27: qty 20

## 2024-09-27 MED ORDER — ONDANSETRON 8 MG PO TBDP: 8.0000 mg | ORAL_TABLET | Freq: Three times a day (TID) | ORAL | 0 refills | Status: AC | PRN

## 2024-09-27 MED ORDER — ONDANSETRON HCL 4 MG/2ML IJ SOLN
INTRAMUSCULAR | Status: DC | PRN
  Administered 2024-09-27: 4 mg via INTRAVENOUS

## 2024-09-27 MED ORDER — CHLORHEXIDINE GLUCONATE 0.12 % MT SOLN
15.0000 mL | Freq: Once | OROMUCOSAL | Status: AC
  Administered 2024-09-27: 15 mL via OROMUCOSAL

## 2024-09-27 MED ORDER — FENTANYL CITRATE (PF) 100 MCG/2ML IJ SOLN
INTRAMUSCULAR | Status: DC | PRN
  Administered 2024-09-27 (×2): 50 ug via INTRAVENOUS

## 2024-09-27 MED ORDER — KETOROLAC TROMETHAMINE 30 MG/ML IJ SOLN
30.0000 mg | INTRAMUSCULAR | Status: AC
  Administered 2024-09-27: 30 mg via INTRAVENOUS
  Filled 2024-09-27: qty 1

## 2024-09-27 MED ORDER — LACTATED RINGERS IV SOLN: INTRAVENOUS | Status: DC

## 2024-09-27 MED ORDER — FENTANYL CITRATE (PF) 50 MCG/ML IJ SOSY
25.0000 ug | PREFILLED_SYRINGE | INTRAMUSCULAR | Status: DC | PRN
  Administered 2024-09-27 (×2): 25 ug via INTRAVENOUS
  Filled 2024-09-27: qty 1

## 2024-09-27 MED ORDER — OXYCODONE-ACETAMINOPHEN 7.5-325 MG PO TABS: 1.0000 | ORAL_TABLET | Freq: Four times a day (QID) | ORAL | 0 refills | Status: AC | PRN

## 2024-09-27 MED ORDER — EPHEDRINE SULFATE (PRESSORS) 25 MG/5ML IV SOSY
PREFILLED_SYRINGE | INTRAVENOUS | Status: DC | PRN
  Administered 2024-09-27: 10 mg via INTRAVENOUS

## 2024-09-27 MED ORDER — ORAL CARE MOUTH RINSE: 15.0000 mL | Freq: Once | OROMUCOSAL | Status: AC

## 2024-09-27 MED ORDER — SUGAMMADEX SODIUM 200 MG/2ML IV SOLN
INTRAVENOUS | Status: DC | PRN
  Administered 2024-09-27: 150 mg via INTRAVENOUS

## 2024-09-27 NOTE — Transfer of Care (Signed)
 Immediate Anesthesia Transfer of Care Note  Patient: Amber Russell  Procedure(s) Performed: OOPHORECTOMY, ROBOT-ASSISTED (Left: Abdomen) SALPINGECTOMY, ROBOT-ASSISTED (Left: Abdomen)  Patient Location: PACU  Anesthesia Type:General  Level of Consciousness: awake  Airway & Oxygen Therapy: Patient Spontanous Breathing  Post-op Assessment: Report given to RN  Post vital signs: Reviewed and stable  Last Vitals:  Vitals Value Taken Time  BP 132/74 09/27/24 09:15  Temp 36.4 C 09/27/24 09:15  Pulse 79 09/27/24 09:16  Resp 13 09/27/24 09:16  SpO2 100 % 09/27/24 09:16  Vitals shown include unfiled device data.  Last Pain:  Vitals:   09/27/24 0705  TempSrc:   PainSc: 0-No pain      Patients Stated Pain Goal: 4 (09/27/24 0645)  Complications: No notable events documented.

## 2024-09-27 NOTE — Anesthesia Procedure Notes (Signed)
 Procedure Name: Intubation Date/Time: 09/27/2024 7:45 AM  Performed by: Toribio Darice BRAVO, CRNAPre-anesthesia Checklist: Patient identified, Patient being monitored, Timeout performed, Emergency Drugs available and Suction available Patient Re-evaluated:Patient Re-evaluated prior to induction Oxygen Delivery Method: Circle system utilized Preoxygenation: Pre-oxygenation with 100% oxygen Induction Type: IV induction Ventilation: Mask ventilation without difficulty Laryngoscope Size: Mac and 3 Grade View: Grade I Tube type: Oral Tube size: 7.0 mm Number of attempts: 1 Airway Equipment and Method: Stylet Placement Confirmation: ETT inserted through vocal cords under direct vision, positive ETCO2 and breath sounds checked- equal and bilateral Secured at: 21 cm Tube secured with: Tape Dental Injury: Teeth and Oropharynx as per pre-operative assessment

## 2024-09-27 NOTE — Progress Notes (Signed)
 Pt reported pain 6/10.  Oxycodone  administered for c/o pain to left lower abdominal port site incision.  Pt tolerated saltine cracker and ginger ale prior to administration of medication.  Dr. Jayne into see pt and assessed right port site with slight oozing present on gauze and approved pt for discharge.  Pt reported pain had decreased slightly to a 5/10 and she stated pain increased once she got into the sitting position, but felt it may be better in a more reclined position.  Pt's husband had received text from pharmacy that more infor was needed.  Dr. Jayne spoke with the husband and let him know this had been corrected and med should be ready for pick up.  Pt discharged home with husband.

## 2024-09-27 NOTE — H&P (Signed)
 " Preoperative History and Physical  Amber Russell is a 54 y.o. [id number] with Patient's last menstrual period was 03/09/2023. admitted for a RA left oophorectomy.  I had multiple conversations with the patient encouraging her to have RA TLH + BSO but she only wants the left ovary removed, not the tubes uteur or other ovary I reconfirmed that decision this am ROMA is normal  PMH:    Past Medical History:  Diagnosis Date   GERD without esophagitis    Hep C w/o coma, chronic (HCC)    Hx of migraines     PSH:     Past Surgical History:  Procedure Laterality Date   BILE DUCT EXPLORATION N/A    CESAREAN SECTION     age 55   CHOLECYSTECTOMY     COLONOSCOPY WITH PROPOFOL  N/A 07/03/2021   Procedure: COLONOSCOPY WITH PROPOFOL ;  Surgeon: Eartha Angelia Sieving, MD;  Location: [facility];  Service: Gastroenterology;  Laterality: N/A;  10:05 AM   KNEE ARTHROPLASTY Left    POLYPECTOMY  07/03/2021   Procedure: POLYPECTOMY INTESTINAL;  Surgeon: Eartha Angelia Sieving, MD;  Location: [facility];  Service: Gastroenterology;;    POb/GynH:      OB History     Gravida  5   Para  2   Term  2   Preterm  0   AB  3   Living         SAB  1   IAB  0   Ectopic  0   Multiple  1   Live Births              SH:  Social History[1]  FH:    Family History  Problem Relation Age of Onset   Allergic rhinitis Son    Asthma Son      Allergies: Allergies[2]  Medications:      Current Medications[3]  Review of Systems:   Review of Systems  Constitutional: Negative for fever, chills, weight loss, malaise/fatigue and diaphoresis.  HENT: Negative for hearing loss, ear pain, nosebleeds, congestion, sore throat, neck pain, tinnitus and ear discharge.   Eyes: Negative for blurred vision, double vision, photophobia, pain, discharge and redness.  Respiratory: Negative for cough, hemoptysis, sputum production, shortness of breath, wheezing and stridor.    Cardiovascular: Negative for chest pain, palpitations, orthopnea, claudication, leg swelling and PND.  Gastrointestinal: Positive for abdominal pain. Negative for heartburn, nausea, vomiting, diarrhea, constipation, blood in stool and melena.  Genitourinary: Negative for dysuria, urgency, frequency, hematuria and flank pain.  Musculoskeletal: Negative for myalgias, back pain, joint pain and falls.  Skin: Negative for itching and rash.  Neurological: Negative for dizziness, tingling, tremors, sensory change, speech change, focal weakness, seizures, loss of consciousness, weakness and headaches.  Endo/Heme/Allergies: Negative for environmental allergies and polydipsia. Does not bruise/bleed easily.  Psychiatric/Behavioral: Negative for depression, suicidal ideas, hallucinations, memory loss and substance abuse. The patient is not nervous/anxious and does not have insomnia.      PHYSICAL EXAM:  Blood pressure (!) 145/76, pulse 68, temperature 98 F (36.7 C), temperature source Oral, resp. rate 11, height 5' 4 (1.626 m), weight 66.7 kg, last menstrual period 03/09/2023, SpO2 100%.    Vitals reviewed. Constitutional: She is oriented to person, place, and time. She appears well-developed and well-nourished.  HENT:  Head: Normocephalic and atraumatic.  Right Ear: External ear normal.  Left Ear: External ear normal.  Nose: Nose normal.  Mouth/Throat: Oropharynx is clear and moist.  Eyes: Conjunctivae  and EOM are normal. Pupils are equal, round, and reactive to light. Right eye exhibits no discharge. Left eye exhibits no discharge. No scleral icterus.  Neck: Normal range of motion. Neck supple. No tracheal deviation present. No thyromegaly present.  Cardiovascular: Normal rate, regular rhythm, normal heart sounds and intact distal pulses.  Exam reveals no gallop and no friction rub.   No murmur heard. Respiratory: Effort normal and breath sounds normal. No respiratory distress. She has no  wheezes. She has no rales. She exhibits no tenderness.  GI: Soft. Bowel sounds are normal. She exhibits no distension and no mass. There is tenderness. There is no rebound and no guarding.  Genitourinary:       Vulva is normal without lesions Vagina is pink moist without discharge Cervix normal in appearance and pap is normal Uterus is normal size, contour, position, consistency, mobility, non-tender Adnexa is per sonogram, 6 cm ovarian cyst Musculoskeletal: Normal range of motion. She exhibits no edema and no tenderness.  Neurological: She is alert and oriented to person, place, and time. She has normal reflexes. She displays normal reflexes. No cranial nerve deficit. She exhibits normal muscle tone. Coordination normal.  Skin: Skin is warm and dry. No rash noted. No erythema. No pallor.  Psychiatric: She has a normal mood and affect. Her behavior is normal. Judgment and thought content normal.    Labs: Results for orders placed or performed during the hospital encounter of 09/23/24 (from the past 2 weeks)  CBC   Collection Time: 09/23/24  2:06 PM  Result Value Ref Range   WBC 7.1 4.0 - 10.5 K/uL   RBC 4.73 3.87 - 5.11 MIL/uL   Hemoglobin 13.9 12.0 - 15.0 g/dL   HCT 57.6 63.9 - 53.9 %   MCV 89.4 80.0 - 100.0 fL   MCH 29.4 26.0 - 34.0 pg   MCHC 32.9 30.0 - 36.0 g/dL   RDW 86.5 88.4 - 84.4 %   Platelets 154 150 - 400 K/uL   nRBC 0.0 0.0 - 0.2 %  Comprehensive metabolic panel   Collection Time: 09/23/24  2:06 PM  Result Value Ref Range   Sodium 138 135 - 145 mmol/L   Potassium 4.0 3.5 - 5.1 mmol/L   Chloride 101 98 - 111 mmol/L   CO2 29 22 - 32 mmol/L   Glucose, Bld 77 70 - 99 mg/dL   BUN 16 6 - 20 mg/dL   Creatinine, Ser 9.42 0.44 - 1.00 mg/dL   Calcium 9.4 8.9 - 89.6 mg/dL   Total Protein 7.3 6.5 - 8.1 g/dL   Albumin 4.6 3.5 - 5.0 g/dL   AST 23 15 - 41 U/L   ALT 15 0 - 44 U/L   Alkaline Phosphatase 47 38 - 126 U/L   Total Bilirubin 1.2 0.0 - 1.2 mg/dL   GFR, Estimated  >39 >39 mL/min   Anion gap 9 5 - 15  Rapid HIV screen (HIV 1/2 Ab+Ag)   Collection Time: 09/23/24  2:06 PM  Result Value Ref Range   HIV-1 P24 Antigen - HIV24 NON REACTIVE NON REACTIVE   HIV 1/2 Antibodies NON REACTIVE NON REACTIVE   Interpretation (HIV Ag Ab)      A non reactive test result means that HIV 1 or HIV 2 antibodies and HIV 1 p24 antigen were not detected in the specimen.  Urinalysis, Routine w reflex microscopic -Urine, Clean Catch   Collection Time: 09/23/24  2:06 PM  Result Value Ref Range   Color, Urine  STRAW (A) YELLOW   APPearance CLEAR CLEAR   Specific Gravity, Urine 1.004 (L) 1.005 - 1.030   pH 6.0 5.0 - 8.0   Glucose, UA NEGATIVE NEGATIVE mg/dL   Hgb urine dipstick SMALL (A) NEGATIVE   Bilirubin Urine NEGATIVE NEGATIVE   Ketones, ur NEGATIVE NEGATIVE mg/dL   Protein, ur NEGATIVE NEGATIVE mg/dL   Nitrite NEGATIVE NEGATIVE   Leukocytes,Ua NEGATIVE NEGATIVE   RBC / HPF 0-5 0 - 5 RBC/hpf   WBC, UA 0-5 0 - 5 WBC/hpf   Bacteria, UA NONE SEEN NONE SEEN   Squamous Epithelial / HPF 0-5 0 - 5 /HPF  Pregnancy, urine   Collection Time: 09/23/24  2:06 PM  Result Value Ref Range   Preg Test, Ur NEGATIVE NEGATIVE  Type and screen   Collection Time: 09/23/24  2:06 PM  Result Value Ref Range   ABO/RH(D) B POS    Antibody Screen NEG    Sample Expiration      09/26/2024,2359 Performed at [facility], [address] [place], [place] [address]     EKG: Orders placed or performed in visit on 10/26/23   EKG 12-Lead    Imaging Studies: No results found.    Assessment: Left ovarian cyst Left lower quadrant pain  Plan: Robot assisted left oophorectomy, declines TLH BSO  [name] 09/27/2024 7:21 AM          [1]  Social History Tobacco Use   Smoking status: Never   Smokeless tobacco: Never  Vaping Use   Vaping status: Never Used  Substance Use Topics   Alcohol use: Never   Drug use: Never  [2] No Known Allergies [3]  Current  Facility-Administered Medications:    ceFAZolin  (ANCEF ) IVPB 2g/100 mL premix, 2 g, Intravenous, On Call to OR, Inch Vonn VEAR, MD   lactated ringers  infusion, , Intravenous, Continuous, Kendell Yvonna PARAS, MD, Last Rate: 10 mL/hr at 09/27/24 0706, New Bag at 09/27/24 0706  "

## 2024-09-27 NOTE — Op Note (Signed)
 Preoperative diagnosis: 6 cm left ovarian cyst Normal ROMA testing Left lower quadrant pain  Postoperative diagnosis: Same as above  Procedure: Robot-assisted laparoscopic left salpingo-oophorectomy  Placement of a TAP block by me for postoperative pain management  Surgeon: [name], MD  Anesthesia: General EndoTracheal  Findings: Intraoperative findings were consistent with the ultrasound which revealed a complex ovarian cyst of 6 cm The right ovary was normal the peritoneal cavity was otherwise normal there were no other concerning findings Per preoperative notes the patient was adamant about just having her left ovary removed The left fallopian tube was densely adherent to the ovary so that was removed as well  Description of operation: Patient was taken to the operating room placed in the supine position where she underwent general endotracheal anesthesia She was prepped and draped in the usual sterile fashion for a robot-assisted for scopic procedure An incision was made above the umbilicus The supraumbilical fascia was grasped with a Kocher clamp pulled away from the abdominal wall A Veress needle was used and placed into the peritoneal cavity with 1 pass slight difficulty with a positive saline drop test The peritoneal cavity was insufflated to a pressure of 12 A nonbladed 8 mm trocar was placed into the peritoneal cavity with 1 pass without difficulty Peritoneal cavity was confirmed 2 additional 8 mm trocars were placed left and right laterally under direct visualization without difficulty  Vessel sealer and ProGrasp were used out the procedure  The left ovary was identified and indeed found to be consistent with the ultrasound findings Vessel sealer was used to coagulate and cut across the infundibulopelvic ligament on the left as well as the utero-ovarian ligament The fallopian tube was removed as well There was good hemostasis  The uterus right ovary and  tube were all normal and the peritoneal cavity was otherwise normal  A tap block was placed in the usual fashion using Doyles bubble technique 40 cc of 0.25% Marcaine  plain was injected at T7 and T11 bilaterally  Large Endo Catch bag was placed into the left lower quadrant and the ovary was placed in the bag The bag was delivered through the left lower quadrant incision the cyst was disrupted in order to remove but there was no intraperitoneal spill The specimen was sent to pathology for evaluation  The left lower quadrant fascia was closed using 0 Vicryl All 3 incisions were closed in 2 layers using 3-0 Vicryl Skin was closed using Dermabond  Patient tolerated the procedure well She experienced about 25 cc of blood loss all of which came from the skin incisions during the procedure There was no intraperitoneal bleeding  She received 2 g of Ancef  and 30 mg of Toradol  IV preoperatively  She was taken the recovery in good stable condition all counts are correct x 3  Estimated blood loss: 25 cc  [name], MD 09/27/2024 9:42 AM

## 2024-09-27 NOTE — Anesthesia Preprocedure Evaluation (Signed)
"                                    Anesthesia Evaluation  Patient identified by MRN, date of birth, ID band Patient awake    Reviewed: Allergy & Precautions, H&P , NPO status , Patient's Chart, lab work & pertinent test results, reviewed documented beta blocker date and time   Airway Mallampati: II  TM Distance: >3 FB Neck ROM: full    Dental no notable dental hx.    Pulmonary asthma    Pulmonary exam normal breath sounds clear to auscultation       Cardiovascular Exercise Tolerance: Good hypertension, negative cardio ROS  Rhythm:regular Rate:Normal     Neuro/Psych  Headaches  negative psych ROS   GI/Hepatic ,GERD  ,,(+) Hepatitis -  Endo/Other  negative endocrine ROS    Renal/GU negative Renal ROS  negative genitourinary   Musculoskeletal   Abdominal   Peds  Hematology negative hematology ROS (+)   Anesthesia Other Findings   Reproductive/Obstetrics negative OB ROS                              Anesthesia Physical Anesthesia Plan  ASA: 2  Anesthesia Plan: General and General ETT   Post-op Pain Management:    Induction:   PONV Risk Score and Plan: Ondansetron   Airway Management Planned:   Additional Equipment:   Intra-op Plan:   Post-operative Plan:   Informed Consent: I have reviewed the patients History and Physical, chart, labs and discussed the procedure including the risks, benefits and alternatives for the proposed anesthesia with the patient or authorized representative who has indicated his/her understanding and acceptance.     Dental Advisory Given  Plan Discussed with: CRNA  Anesthesia Plan Comments:         Anesthesia Quick Evaluation  "

## 2024-09-27 NOTE — Progress Notes (Signed)
 Spoke with Dr Jayne about Lap site on right side oozing.  Reported dressing placed 2x2 and Tegaderm.

## 2024-09-28 ENCOUNTER — Encounter (HOSPITAL_COMMUNITY): Payer: Self-pay | Admitting: Obstetrics & Gynecology

## 2024-09-28 LAB — SURGICAL PATHOLOGY

## 2024-09-29 NOTE — Anesthesia Postprocedure Evaluation (Signed)
"   Anesthesia Post Note  Patient: Amber Russell  Procedure(s) Performed: OOPHORECTOMY, ROBOT-ASSISTED (Left: Abdomen) SALPINGECTOMY, ROBOT-ASSISTED (Left: Abdomen)  Patient location during evaluation: Phase II Anesthesia Type: General Level of consciousness: awake Pain management: pain level controlled Vital Signs Assessment: post-procedure vital signs reviewed and stable Respiratory status: spontaneous breathing and respiratory function stable Cardiovascular status: blood pressure returned to baseline and stable Postop Assessment: no headache and no apparent nausea or vomiting Anesthetic complications: no Comments: Late entry   No notable events documented.   Last Vitals:  Vitals:   09/27/24 1015 09/27/24 1036  BP: (!) 145/73 135/79  Pulse: 60 64  Resp: 10   Temp:  (!) 36.4 C  SpO2: 99% 100%    Last Pain:  Vitals:   09/28/24 1509  TempSrc:   PainSc: 4                  Yvonna PARAS Aki Burdin      "

## 2024-10-07 ENCOUNTER — Encounter: Payer: Self-pay | Admitting: Obstetrics & Gynecology

## 2024-10-07 ENCOUNTER — Ambulatory Visit: Admitting: Obstetrics & Gynecology

## 2024-10-07 VITALS — BP 136/79 | HR 64 | Ht 64.0 in | Wt 151.6 lb

## 2024-10-07 DIAGNOSIS — Z48816 Encounter for surgical aftercare following surgery on the genitourinary system: Secondary | ICD-10-CM

## 2024-10-07 DIAGNOSIS — Z9889 Other specified postprocedural states: Secondary | ICD-10-CM

## 2024-10-07 NOTE — Progress Notes (Signed)
" °  HPI: Patient returns for routine postoperative follow-up having undergone   (Z98.890) Post-operative state: RA LSO 09/27/24  (primary encounter diagnosis)    The patient's immediate postoperative recovery has been unremarkable. Since hospital discharge the patient reports abdominal soreness.   Current Outpatient Medications: acetaminophen  (TYLENOL ) 500 MG tablet, Take 500 mg by mouth every 6 (six) hours as needed (pain.)., Disp: , Rfl:  Atogepant 60 MG TABS, Take 60 mg by mouth 4 (four) times a week., Disp: , Rfl:  hydrocortisone  (ANUSOL -HC) 2.5 % rectal cream, Place rectally 2 (two) times daily. (Patient taking differently: Place rectally as needed.), Disp: 30 g, Rfl: 1 ketorolac  (TORADOL ) 10 MG tablet, TAKE 1 TABLET BY MOUTH EVERY 8 HOURS AS NEEDED., Disp: 15 tablet, Rfl: 0 ondansetron  (ZOFRAN -ODT) 8 MG disintegrating tablet, Take 1 tablet (8 mg total) by mouth every 8 (eight) hours as needed for nausea or vomiting., Disp: 8 tablet, Rfl: 0 oxyCODONE -acetaminophen  (PERCOCET) 7.5-325 MG tablet, Take 1 tablet by mouth every 6 (six) hours as needed., Disp: 28 tablet, Rfl: 0  No current facility-administered medications for this visit.    Blood pressure 136/79, pulse 64, height 5' 4 (1.626 m), weight 151 lb 9.6 oz (68.8 kg), last menstrual period 03/09/2023.  Physical Exam: 3 incisions look good, brusing no hematoma  Diagnostic Tests:   Pathology: benign  Impression + Management plan: (S01.109) Post-operative state: RA LSO 09/27/24  (primary encounter diagnosis)      Medications Prescribed this encounter: No orders of the defined types were placed in this encounter.     Follow up: prn   [name], MD Attending Physician for the [facility] 10/07/2024 10:27 AM     "
